# Patient Record
Sex: Female | Born: 1940 | Race: White | Hispanic: No | Smoking: Former smoker
Health system: Southern US, Community
[De-identification: ages and names within clinical notes are randomized; demographics above are authoritative.]

## PROBLEM LIST (undated history)

## (undated) DIAGNOSIS — J189 Pneumonia, unspecified organism: Secondary | ICD-10-CM

## (undated) DIAGNOSIS — N189 Chronic kidney disease, unspecified: Secondary | ICD-10-CM

## (undated) DIAGNOSIS — R112 Nausea with vomiting, unspecified: Secondary | ICD-10-CM

## (undated) DIAGNOSIS — M810 Age-related osteoporosis without current pathological fracture: Secondary | ICD-10-CM

## (undated) DIAGNOSIS — T4145XA Adverse effect of unspecified anesthetic, initial encounter: Secondary | ICD-10-CM

## (undated) DIAGNOSIS — E119 Type 2 diabetes mellitus without complications: Secondary | ICD-10-CM

## (undated) DIAGNOSIS — M75121 Complete rotator cuff tear or rupture of right shoulder, not specified as traumatic: Secondary | ICD-10-CM

## (undated) DIAGNOSIS — C801 Malignant (primary) neoplasm, unspecified: Secondary | ICD-10-CM

## (undated) DIAGNOSIS — K635 Polyp of colon: Secondary | ICD-10-CM

## (undated) DIAGNOSIS — L4050 Arthropathic psoriasis, unspecified: Secondary | ICD-10-CM

## (undated) DIAGNOSIS — N390 Urinary tract infection, site not specified: Secondary | ICD-10-CM

## (undated) DIAGNOSIS — I10 Essential (primary) hypertension: Secondary | ICD-10-CM

## (undated) DIAGNOSIS — K219 Gastro-esophageal reflux disease without esophagitis: Secondary | ICD-10-CM

## (undated) DIAGNOSIS — M19011 Primary osteoarthritis, right shoulder: Secondary | ICD-10-CM

## (undated) DIAGNOSIS — N3020 Other chronic cystitis without hematuria: Secondary | ICD-10-CM

## (undated) DIAGNOSIS — L409 Psoriasis, unspecified: Secondary | ICD-10-CM

## (undated) DIAGNOSIS — D649 Anemia, unspecified: Secondary | ICD-10-CM

## (undated) DIAGNOSIS — M159 Polyosteoarthritis, unspecified: Secondary | ICD-10-CM

## (undated) HISTORY — PX: APPENDECTOMY: SHX54

## (undated) HISTORY — PX: CATARACT EXTRACTION, BILATERAL: SHX1313

## (undated) HISTORY — PX: OTHER SURGICAL HISTORY: SHX169

---

## 1967-07-04 HISTORY — PX: ABDOMINAL HYSTERECTOMY: SHX81

## 2004-05-02 ENCOUNTER — Ambulatory Visit: Payer: Self-pay | Admitting: Family Medicine

## 2004-05-11 ENCOUNTER — Ambulatory Visit: Payer: Self-pay | Admitting: Family Medicine

## 2004-06-13 ENCOUNTER — Ambulatory Visit: Payer: Self-pay | Admitting: Obstetrics and Gynecology

## 2006-01-11 ENCOUNTER — Ambulatory Visit: Payer: Self-pay | Admitting: Family Medicine

## 2006-10-30 ENCOUNTER — Ambulatory Visit: Payer: Self-pay | Admitting: Urology

## 2006-11-08 ENCOUNTER — Ambulatory Visit: Payer: Self-pay | Admitting: Urology

## 2006-11-21 ENCOUNTER — Ambulatory Visit: Payer: Self-pay | Admitting: Urology

## 2006-11-22 ENCOUNTER — Ambulatory Visit: Payer: Self-pay | Admitting: Urology

## 2008-01-15 ENCOUNTER — Ambulatory Visit: Payer: Self-pay | Admitting: Family Medicine

## 2008-06-03 ENCOUNTER — Ambulatory Visit: Payer: Self-pay | Admitting: Unknown Physician Specialty

## 2008-07-03 HISTORY — PX: BLADDER SUSPENSION: SHX72

## 2009-09-03 ENCOUNTER — Ambulatory Visit: Payer: Self-pay | Admitting: Cardiovascular Disease

## 2009-09-03 ENCOUNTER — Ambulatory Visit: Payer: Self-pay | Admitting: Ophthalmology

## 2009-09-13 ENCOUNTER — Ambulatory Visit: Payer: Self-pay | Admitting: Ophthalmology

## 2009-10-13 ENCOUNTER — Ambulatory Visit: Payer: Self-pay | Admitting: Urology

## 2009-10-14 ENCOUNTER — Ambulatory Visit: Payer: Self-pay | Admitting: Urology

## 2009-10-28 ENCOUNTER — Ambulatory Visit: Payer: Self-pay | Admitting: Rheumatology

## 2009-11-02 ENCOUNTER — Ambulatory Visit: Payer: Self-pay | Admitting: Family Medicine

## 2009-12-01 ENCOUNTER — Ambulatory Visit: Payer: Self-pay

## 2010-01-24 ENCOUNTER — Ambulatory Visit: Payer: Self-pay | Admitting: Ophthalmology

## 2010-08-16 ENCOUNTER — Inpatient Hospital Stay: Payer: Self-pay | Admitting: Internal Medicine

## 2010-08-24 ENCOUNTER — Ambulatory Visit: Payer: Self-pay | Admitting: Internal Medicine

## 2010-09-01 ENCOUNTER — Ambulatory Visit: Payer: Self-pay | Admitting: Internal Medicine

## 2010-09-27 DIAGNOSIS — R32 Unspecified urinary incontinence: Secondary | ICD-10-CM | POA: Insufficient documentation

## 2010-09-27 DIAGNOSIS — N819 Female genital prolapse, unspecified: Secondary | ICD-10-CM | POA: Insufficient documentation

## 2010-09-27 DIAGNOSIS — N390 Urinary tract infection, site not specified: Secondary | ICD-10-CM | POA: Insufficient documentation

## 2010-10-02 ENCOUNTER — Ambulatory Visit: Payer: Self-pay | Admitting: Internal Medicine

## 2010-11-01 ENCOUNTER — Ambulatory Visit: Payer: Self-pay | Admitting: Internal Medicine

## 2010-12-05 ENCOUNTER — Ambulatory Visit: Payer: Self-pay | Admitting: Family Medicine

## 2010-12-06 ENCOUNTER — Ambulatory Visit: Payer: Self-pay | Admitting: Family Medicine

## 2010-12-12 DIAGNOSIS — N8111 Cystocele, midline: Secondary | ICD-10-CM | POA: Insufficient documentation

## 2010-12-12 DIAGNOSIS — N3941 Urge incontinence: Secondary | ICD-10-CM | POA: Insufficient documentation

## 2012-05-13 DIAGNOSIS — N3020 Other chronic cystitis without hematuria: Secondary | ICD-10-CM | POA: Insufficient documentation

## 2012-05-13 DIAGNOSIS — R350 Frequency of micturition: Secondary | ICD-10-CM | POA: Insufficient documentation

## 2012-05-27 ENCOUNTER — Ambulatory Visit: Payer: Self-pay | Admitting: Family Medicine

## 2013-05-28 ENCOUNTER — Ambulatory Visit: Payer: Self-pay | Admitting: Family Medicine

## 2013-07-03 HISTORY — PX: OVARIAN CYST REMOVAL: SHX89

## 2013-10-08 DIAGNOSIS — M1990 Unspecified osteoarthritis, unspecified site: Secondary | ICD-10-CM | POA: Insufficient documentation

## 2013-10-08 DIAGNOSIS — L4050 Arthropathic psoriasis, unspecified: Secondary | ICD-10-CM | POA: Insufficient documentation

## 2013-10-08 DIAGNOSIS — M810 Age-related osteoporosis without current pathological fracture: Secondary | ICD-10-CM | POA: Insufficient documentation

## 2013-10-08 DIAGNOSIS — L409 Psoriasis, unspecified: Secondary | ICD-10-CM | POA: Insufficient documentation

## 2014-03-23 DIAGNOSIS — E119 Type 2 diabetes mellitus without complications: Secondary | ICD-10-CM | POA: Insufficient documentation

## 2014-05-07 DIAGNOSIS — Z860101 Personal history of adenomatous and serrated colon polyps: Secondary | ICD-10-CM | POA: Insufficient documentation

## 2014-05-07 DIAGNOSIS — Z86010 Personal history of colonic polyps: Secondary | ICD-10-CM | POA: Insufficient documentation

## 2014-06-11 ENCOUNTER — Ambulatory Visit: Payer: Self-pay | Admitting: Unknown Physician Specialty

## 2014-10-26 LAB — SURGICAL PATHOLOGY

## 2015-02-01 DIAGNOSIS — A7740 Ehrlichiosis, unspecified: Secondary | ICD-10-CM | POA: Insufficient documentation

## 2015-02-19 ENCOUNTER — Inpatient Hospital Stay
Admission: EM | Admit: 2015-02-19 | Discharge: 2015-02-24 | DRG: 645 | Disposition: A | Discharge status: Home / Self Care | Payer: Medicare Other | Attending: Internal Medicine | Admitting: Internal Medicine

## 2015-02-19 DIAGNOSIS — R61 Generalized hyperhidrosis: Secondary | ICD-10-CM | POA: Diagnosis present

## 2015-02-19 DIAGNOSIS — N289 Disorder of kidney and ureter, unspecified: Secondary | ICD-10-CM | POA: Diagnosis present

## 2015-02-19 DIAGNOSIS — L4050 Arthropathic psoriasis, unspecified: Secondary | ICD-10-CM | POA: Diagnosis present

## 2015-02-19 DIAGNOSIS — M810 Age-related osteoporosis without current pathological fracture: Secondary | ICD-10-CM | POA: Diagnosis present

## 2015-02-19 DIAGNOSIS — Z87440 Personal history of urinary (tract) infections: Secondary | ICD-10-CM

## 2015-02-19 DIAGNOSIS — R1013 Epigastric pain: Secondary | ICD-10-CM | POA: Diagnosis present

## 2015-02-19 DIAGNOSIS — E222 Syndrome of inappropriate secretion of antidiuretic hormone: Secondary | ICD-10-CM | POA: Diagnosis not present

## 2015-02-19 DIAGNOSIS — E860 Dehydration: Secondary | ICD-10-CM | POA: Diagnosis present

## 2015-02-19 DIAGNOSIS — E119 Type 2 diabetes mellitus without complications: Secondary | ICD-10-CM | POA: Diagnosis present

## 2015-02-19 DIAGNOSIS — E871 Hypo-osmolality and hyponatremia: Secondary | ICD-10-CM

## 2015-02-19 DIAGNOSIS — Z9841 Cataract extraction status, right eye: Secondary | ICD-10-CM

## 2015-02-19 DIAGNOSIS — D638 Anemia in other chronic diseases classified elsewhere: Secondary | ICD-10-CM | POA: Diagnosis present

## 2015-02-19 DIAGNOSIS — Z86010 Personal history of colonic polyps: Secondary | ICD-10-CM

## 2015-02-19 DIAGNOSIS — Z803 Family history of malignant neoplasm of breast: Secondary | ICD-10-CM

## 2015-02-19 DIAGNOSIS — Z79891 Long term (current) use of opiate analgesic: Secondary | ICD-10-CM

## 2015-02-19 DIAGNOSIS — Z90710 Acquired absence of both cervix and uterus: Secondary | ICD-10-CM

## 2015-02-19 DIAGNOSIS — Z79899 Other long term (current) drug therapy: Secondary | ICD-10-CM

## 2015-02-19 DIAGNOSIS — R509 Fever, unspecified: Secondary | ICD-10-CM

## 2015-02-19 DIAGNOSIS — Z9842 Cataract extraction status, left eye: Secondary | ICD-10-CM

## 2015-02-19 HISTORY — DX: Polyp of colon: K63.5

## 2015-02-19 HISTORY — DX: Polyosteoarthritis, unspecified: M15.9

## 2015-02-19 HISTORY — DX: Arthropathic psoriasis, unspecified: L40.50

## 2015-02-19 HISTORY — DX: Type 2 diabetes mellitus without complications: E11.9

## 2015-02-19 HISTORY — DX: Psoriasis, unspecified: L40.9

## 2015-02-19 HISTORY — DX: Other chronic cystitis without hematuria: N30.20

## 2015-02-19 HISTORY — DX: Age-related osteoporosis without current pathological fracture: M81.0

## 2015-02-19 HISTORY — DX: Urinary tract infection, site not specified: N39.0

## 2015-02-19 LAB — GLUCOSE, CAPILLARY: Glucose-Capillary: 106 mg/dL — ABNORMAL HIGH (ref 65–99)

## 2015-02-19 LAB — CBC
HEMATOCRIT: 31.1 % — AB (ref 35.0–47.0)
HEMOGLOBIN: 10.6 g/dL — AB (ref 12.0–16.0)
MCH: 29.6 pg (ref 26.0–34.0)
MCHC: 34.1 g/dL (ref 32.0–36.0)
MCV: 86.8 fL (ref 80.0–100.0)
Platelets: 135 10*3/uL — ABNORMAL LOW (ref 150–440)
RBC: 3.58 MIL/uL — ABNORMAL LOW (ref 3.80–5.20)
RDW: 14.4 % (ref 11.5–14.5)
WBC: 7.5 10*3/uL (ref 3.6–11.0)

## 2015-02-19 LAB — CREATININE, SERUM
Creatinine, Ser: 1.4 mg/dL — ABNORMAL HIGH (ref 0.44–1.00)
GFR, EST AFRICAN AMERICAN: 42 mL/min — AB (ref >60)
GFR, EST NON AFRICAN AMERICAN: 36 mL/min — AB (ref >60)

## 2015-02-19 LAB — TSH: TSH: 2.562 u[IU]/mL (ref 0.350–4.500)

## 2015-02-19 MED ORDER — SODIUM CHLORIDE 0.9 % IV BOLUS (SEPSIS)
500.0000 mL | Freq: Once | INTRAVENOUS | Status: AC
Start: 2015-02-19 — End: 2015-02-19
  Administered 2015-02-19: 500 mL via INTRAVENOUS

## 2015-02-19 MED ORDER — ONDANSETRON HCL 4 MG PO TABS: 4.0000 mg | ORAL_TABLET | Freq: Four times a day (QID) | ORAL | Status: DC | PRN

## 2015-02-19 MED ORDER — HYDROCODONE-ACETAMINOPHEN 5-325 MG PO TABS: 1.0000 | ORAL_TABLET | ORAL | Status: DC | PRN

## 2015-02-19 MED ORDER — TRAMADOL HCL 50 MG PO TABS
50.0000 mg | ORAL_TABLET | Freq: Four times a day (QID) | ORAL | Status: DC | PRN
  Administered 2015-02-20 – 2015-02-23 (×3): 50 mg via ORAL
  Filled 2015-02-19 (×3): qty 1

## 2015-02-19 MED ORDER — ENOXAPARIN SODIUM 40 MG/0.4ML ~~LOC~~ SOLN
40.0000 mg | SUBCUTANEOUS | Status: DC
  Administered 2015-02-19 – 2015-02-23 (×5): 40 mg via SUBCUTANEOUS
  Filled 2015-02-19 (×5): qty 0.4

## 2015-02-19 MED ORDER — INSULIN ASPART 100 UNIT/ML ~~LOC~~ SOLN
0.0000 [IU] | Freq: Three times a day (TID) | SUBCUTANEOUS | Status: DC
  Administered 2015-02-22 – 2015-02-23 (×2): 1 [IU] via SUBCUTANEOUS
  Filled 2015-02-19 (×2): qty 1
  Filled 2015-02-19: qty 7

## 2015-02-19 MED ORDER — ACETAMINOPHEN 325 MG PO TABS
650.0000 mg | ORAL_TABLET | Freq: Four times a day (QID) | ORAL | Status: DC | PRN
  Administered 2015-02-20 – 2015-02-21 (×4): 650 mg via ORAL
  Filled 2015-02-19 (×4): qty 2

## 2015-02-19 MED ORDER — ONDANSETRON HCL 4 MG/2ML IJ SOLN: 4.0000 mg | Freq: Four times a day (QID) | INTRAMUSCULAR | Status: DC | PRN

## 2015-02-19 MED ORDER — MAGNESIUM OXIDE 400 MG PO TABS
400.0000 mg | ORAL_TABLET | Freq: Every day | ORAL | Status: DC
  Administered 2015-02-20 – 2015-02-24 (×5): 400 mg via ORAL
  Filled 2015-02-19 (×10): qty 1

## 2015-02-19 MED ORDER — CYCLOBENZAPRINE HCL 10 MG PO TABS
ORAL_TABLET | ORAL | Status: AC
  Administered 2015-02-19: 10 mg via ORAL
  Filled 2015-02-19: qty 1

## 2015-02-19 MED ORDER — CYCLOBENZAPRINE HCL 10 MG PO TABS
10.0000 mg | ORAL_TABLET | Freq: Three times a day (TID) | ORAL | Status: DC | PRN
  Administered 2015-02-19 – 2015-02-23 (×8): 10 mg via ORAL
  Filled 2015-02-19 (×7): qty 1

## 2015-02-19 MED ORDER — PHENAZOPYRIDINE HCL 200 MG PO TABS
200.0000 mg | ORAL_TABLET | Freq: Three times a day (TID) | ORAL | Status: DC | PRN
  Filled 2015-02-19: qty 1

## 2015-02-19 MED ORDER — SODIUM CHLORIDE 0.9 % IV SOLN
Freq: Once | INTRAVENOUS | Status: AC
  Administered 2015-02-19: 18:00:00 via INTRAVENOUS

## 2015-02-19 MED ORDER — OMEGA-3-ACID ETHYL ESTERS 1 G PO CAPS
ORAL_CAPSULE | Freq: Every day | ORAL | Status: DC
Start: 2015-02-19 — End: 2015-02-24
  Administered 2015-02-19 – 2015-02-24 (×6): 1 g via ORAL
  Filled 2015-02-19 (×6): qty 1

## 2015-02-19 MED ORDER — SODIUM CHLORIDE 0.9 % IV SOLN
INTRAVENOUS | Status: DC
  Administered 2015-02-19 – 2015-02-20 (×2): via INTRAVENOUS

## 2015-02-19 MED ORDER — ACETAMINOPHEN 650 MG RE SUPP: 650.0000 mg | Freq: Four times a day (QID) | RECTAL | Status: DC | PRN

## 2015-02-19 MED ORDER — OXYBUTYNIN CHLORIDE ER 5 MG PO TB24
5.0000 mg | ORAL_TABLET | Freq: Two times a day (BID) | ORAL | Status: DC
  Administered 2015-02-19 – 2015-02-24 (×10): 5 mg via ORAL
  Filled 2015-02-19 (×11): qty 1

## 2015-02-19 MED ORDER — FAMOTIDINE 20 MG PO TABS
20.0000 mg | ORAL_TABLET | Freq: Two times a day (BID) | ORAL | Status: DC
  Administered 2015-02-19 – 2015-02-24 (×10): 20 mg via ORAL
  Filled 2015-02-19 (×10): qty 1

## 2015-02-19 MED ORDER — SULFAMETHOXAZOLE-TRIMETHOPRIM 400-80 MG PO TABS
1.0000 | ORAL_TABLET | Freq: Two times a day (BID) | ORAL | Status: DC
  Administered 2015-02-19 – 2015-02-20 (×2): 1 via ORAL
  Filled 2015-02-19 (×3): qty 1

## 2015-02-19 MED ORDER — ESTRADIOL 0.1 MG/GM VA CREA
1.0000 | TOPICAL_CREAM | VAGINAL | Status: DC
  Administered 2015-02-19 – 2015-02-22 (×2): 1 via VAGINAL
  Filled 2015-02-19 (×2): qty 42.5

## 2015-02-19 MED ORDER — VITAMIN D 1000 UNITS PO TABS
1000.0000 [IU] | ORAL_TABLET | Freq: Every day | ORAL | Status: DC
  Administered 2015-02-19 – 2015-02-23 (×5): 1000 [IU] via ORAL
  Filled 2015-02-19 (×5): qty 1

## 2015-02-19 MED ORDER — CRANBERRY 500 MG PO CAPS: 500.0000 mg | ORAL_CAPSULE | Freq: Every day | ORAL | Status: DC

## 2015-02-19 MED ORDER — BIOTIN 10 MG PO CAPS: 10.0000 mg | ORAL_CAPSULE | Freq: Every day | ORAL | Status: DC

## 2015-02-19 MED ORDER — ZOLPIDEM TARTRATE 5 MG PO TABS: 5.0000 mg | ORAL_TABLET | Freq: Every evening | ORAL | Status: DC | PRN

## 2015-02-19 NOTE — ED Notes (Signed)
Pt c/o generalized bodyaches since Sunday night with nausea, then began running a fever last night 101..states she went to [facility] today and they drew labs showed Na+ 126, urea nitrogen 36.Marland Kitchen

## 2015-02-19 NOTE — ED Notes (Signed)
Pt reports knee pain 10/10.  Pt offered tylenol but refused.

## 2015-02-19 NOTE — ED Notes (Signed)
Pts daughter states she is getting weaker, was wondering if there was anything we can do for her while she waited. Pt pulled from lobby, IV initiated, fluids started per order.

## 2015-02-19 NOTE — ED Provider Notes (Signed)
[facility] Emergency Department Provider Note   ____________________________________________  Time seen: 5:45 PM I have reviewed the triage vital signs and the triage nursing note.  HISTORY  Chief Complaint Generalized Body Aches and Fever   Historian Patient, and her sister  HPI Patricia Lucas is a 74 y.o. female who presented to urgent care this morning with a complaint of mild generalized headache, nausea for several days, generalized weakness and fatigue, and was found to have laboratory evaluation consistent with hyponatremia to 126. She reports no history of prior low sodium. She states that she has been out in the heat multiple days including mowing her yard where she's been sweating a lot. No vomiting or diarrhea. No fever. No chest pain, palpitations, or shortness of breath. She did fall out of her bed a couple days ago, but did not significantly strike her head. She struck the side of her chest wall and had a chest x-ray which was reportedly negative per urgent care.She does not take blood thinners or aspirin.    Past Medical History  Diagnosis Date  . Diabetes mellitus without complication   . UTI (lower urinary tract infection)     There are no active problems to display for this patient.   Past Surgical History  Procedure Laterality Date  . Bladder suspension    . Ovarian cyst removal      Current Outpatient Rx  Name  Route  Sig  Dispense  Refill  . Biotin 10 MG CAPS   Oral   Take 10 mg by mouth daily.         . Cholecalciferol (VITAMIN D3) 1000 UNITS CAPS   Oral   Take 1,000 Units by mouth at bedtime.         . Cranberry 500 MG CAPS   Oral   Take 500 mg by mouth daily.         . cyclobenzaprine (FLEXERIL) 10 MG tablet   Oral   Take 10 mg by mouth 3 (three) times daily as needed for muscle spasms.         Marland Kitchen estradiol (ESTRACE) 0.1 MG/GM vaginal cream   Vaginal   Place 1 Applicatorful vaginally 3 (three) times a  week. Pt uses on Monday, Wednesday, and Friday.         Marland Kitchen HYDROcodone-acetaminophen (NORCO/VICODIN) 5-325 MG per tablet   Oral   Take 1 tablet by mouth every 4 (four) hours as needed for moderate pain.         . magnesium oxide (MAG-OX) 400 MG tablet   Oral   Take 400 mg by mouth daily.         . metFORMIN (GLUCOPHAGE) 500 MG tablet   Oral   Take 500 mg by mouth 2 (two) times daily.         . Omega-3 Fatty Acids (OMEGA 3 PO)   Oral   Take 1 capsule by mouth daily.         Marland Kitchen oxybutynin (DITROPAN-XL) 5 MG 24 hr tablet   Oral   Take 5 mg by mouth 2 (two) times daily.         . phenazopyridine (PYRIDIUM) 200 MG tablet   Oral   Take 200 mg by mouth 3 (three) times daily as needed for pain.         . ranitidine (ZANTAC) 150 MG tablet   Oral   Take 150 mg by mouth 2 (two) times daily.         Marland Kitchen  sulfamethoxazole-trimethoprim (BACTRIM,SEPTRA) 400-80 MG per tablet   Oral   Take 1 tablet by mouth 2 (two) times daily.         . traMADol (ULTRAM) 50 MG tablet   Oral   Take 50 mg by mouth every 6 (six) hours as needed for moderate pain.         Marland Kitchen zolpidem (AMBIEN) 5 MG tablet   Oral   Take 5 mg by mouth at bedtime as needed for sleep.           Allergies Review of patient's allergies indicates no known allergies.  No family history on file.  Social History Social History  Substance Use Topics  . Smoking status: Former Games developer  . Smokeless tobacco: Never Used  . Alcohol Use: No    Review of Systems  Constitutional: Negative for fever. Eyes: Negative for visual changes. ENT: Negative for sore throat. Cardiovascular: Negative for chest pain. Respiratory: Negative for shortness of breath. Gastrointestinal: Negative for abdominal pain, vomiting and diarrhea. Genitourinary: Negative for dysuria. Musculoskeletal: Negative for back pain. Skin: Negative for rash. Neurological: Negative for focal weakness or numbness. 10 point Review of Systems  otherwise negative ____________________________________________   PHYSICAL EXAM:  VITAL SIGNS: ED Triage Vitals  Enc Vitals Group     BP 02/19/15 1300 102/72 mmHg     Pulse Rate 02/19/15 1300 103     Resp 02/19/15 1300 18     Temp 02/19/15 1300 98.7 F (37.1 C)     Temp Source 02/19/15 1300 Oral     SpO2 02/19/15 1300 95 %     Weight 02/19/15 1300 140 lb (63.504 kg)     Height 02/19/15 1300  (1.575 m)     Head Cir --      Peak Flow --      Pain Score --      Pain Loc --      Pain Edu? --      Excl. in GC? --      Constitutional: Alert and oriented. Well appearing and in no distress. Eyes: Conjunctivae are normal. PERRL. Normal extraocular movements. ENT   Head: Normocephalic and atraumatic.   Nose: No congestion/rhinnorhea.   Mouth/Throat: Mucous membranes are moist.   Neck: No stridor. Cardiovascular/Chest: Normal rate, regular rhythm.  No murmurs, rubs, or gallops. Respiratory: Normal respiratory effort without tachypnea nor retractions. Breath sounds are clear and equal bilaterally. No wheezes/rales/rhonchi. Gastrointestinal: Soft. No distention, no guarding, no rebound. Nontender   Genitourinary/rectal:Deferred Musculoskeletal: Nontender with normal range of motion in all extremities. No joint effusions.  No lower extremity tenderness nor edema. Neurologic:  Normal speech and language. No gross or focal neurologic deficits are appreciated. Skin:  Skin is warm, dry and intact. No rash noted. Psychiatric: Mood and affect are normal. Speech and behavior are normal. Patient exhibits appropriate insight and judgment.  ____________________________________________   EKG I, Governor Rooks, MD, the attending physician have personally viewed and interpreted all ECGs.  95 bpm. Normal axis. Normal and narrow QRS. Normal ST and T-wave. ____________________________________________  LABS (pertinent positives/negatives)  I reviewed her labs from earlier today  which show mild increased BUN and creatinine, hyponatremia of 126. No prior labs available in the system for comparison. Urinalysis negative CBC minimally elevated white blood cell count.  ____________________________________________  RADIOLOGY All Xrays were viewed by me. Imaging interpreted by Radiologist.  None __________________________________________  PROCEDURES  Procedure(s) performed: None Critical Care performed: None  ____________________________________________   ED COURSE / ASSESSMENT  AND PLAN  CONSULTATIONS: Face-to-face with hospitalist for admission  Pertinent labs & imaging results that were available during my care of the patient were reviewed by me and considered in my medical decision making (see chart for details).   Patient is being evaluated for symptomatic and new hyponatremia. She was started with a normal saline bolus of 1 L. She was started on 100 cc per hour normal saline after the bolus. Intact neurologic exam, I do not feel a head CT is indicated given the complaint of headache. The headache is now better after the saline bolus.  I will admit her for observation for new and symptomatic hyponatremia.  Patient / Family / Caregiver informed of clinical course, medical decision-making process, and agree with plan.    ___________________________________________   FINAL CLINICAL IMPRESSION(S) / ED DIAGNOSES   Final diagnoses:  Hyponatremia      Governor Rooks, MD 02/19/15 Rickey Primus

## 2015-02-19 NOTE — H&P (Signed)
[facility] [place] at [facility]   PATIENT NAME: Patricia Lucas    MR#:  [id number]  DATE OF BIRTH:  1941-01-07  DATE OF ADMISSION:  02/19/2015  PRIMARY CARE PHYSICIAN: Dorothey Baseman, MD   REQUESTING/REFERRING PHYSICIAN: Governor Rooks MD  CHIEF COMPLAINT:   Chief Complaint  Patient presents with  . Generalized Body Aches  . Fever    HISTORY OF PRESENT ILLNESS: Patricia Lucas  is a 74 y.o. female with a known history of diabetes type 2, psoriasis, dyspepsia, recurrent UTIs who states that that she has not been feeling well since last Sunday. She developed some fevers and chills. Due to this symptom she went to the urgent care in the urgent care patient was noted to have a sodium that was 125 so referred to the emergency room for referral. Patient states that she is otherwise not having any cough no chest pain or shortness of breath no nausea vomiting or diarrhea  PAST MEDICAL HISTORY:   Past Medical History  Diagnosis Date  . Diabetes mellitus without complication   . UTI (lower urinary tract infection)   . Psoriasis   . Psoriatic arthritis   . Generalized OA   . Osteoporosis   . Chronic cystitis   . Colon polyp     PAST SURGICAL HISTORY:  Past Surgical History  Procedure Laterality Date  . Bladder suspension    . Ovarian cyst removal    . Abdominal hysterectomy    . Cataract extraction, bilateral    . Fibula fract      SOCIAL HISTORY:  Social History  Substance Use Topics  . Smoking status: Never Smoker   . Smokeless tobacco: Never Used  . Alcohol Use: No    FAMILY HISTORY:  Family History  Problem Relation Age of Onset  . Breast cancer Mother     DRUG ALLERGIES: No Known Allergies  REVIEW OF SYSTEMS:   CONSTITUTIONAL: Positive fever, positive fatigue or weakness.  EYES: No blurred or double vision.  EARS, NOSE, AND THROAT: No tinnitus or ear pain.  RESPIRATORY: No cough, shortness of breath, wheezing or hemoptysis.   CARDIOVASCULAR: No chest pain, orthopnea, edema.  GASTROINTESTINAL: No nausea, vomiting, diarrhea or abdominal pain.  GENITOURINARY: No dysuria, hematuria.  ENDOCRINE: No polyuria, nocturia,  HEMATOLOGY: No anemia, easy bruising or bleeding SKIN: No rash or lesion. MUSCULOSKELETAL: No joint pain or arthritis.   NEUROLOGIC: No tingling, numbness, weakness.  PSYCHIATRY: No anxiety or depression.   MEDICATIONS AT HOME:  Prior to Admission medications   Medication Sig Start Date End Date Taking? Authorizing Provider  Biotin 10 MG CAPS Take 10 mg by mouth daily.   Yes Historical Provider, MD  Cholecalciferol (VITAMIN D3) 1000 UNITS CAPS Take 1,000 Units by mouth at bedtime.   Yes Historical Provider, MD  Cranberry 500 MG CAPS Take 500 mg by mouth daily.   Yes Historical Provider, MD  cyclobenzaprine (FLEXERIL) 10 MG tablet Take 10 mg by mouth 3 (three) times daily as needed for muscle spasms.   Yes Historical Provider, MD  estradiol (ESTRACE) 0.1 MG/GM vaginal cream Place 1 Applicatorful vaginally 3 (three) times a week. Pt uses on Monday, Wednesday, and Friday.   Yes Historical Provider, MD  HYDROcodone-acetaminophen (NORCO/VICODIN) 5-325 MG per tablet Take 1 tablet by mouth every 4 (four) hours as needed for moderate pain.   Yes Historical Provider, MD  magnesium oxide (MAG-OX) 400 MG tablet Take 400 mg by mouth daily.   Yes Historical Provider, MD  metFORMIN (GLUCOPHAGE) 500 MG tablet Take 500 mg by mouth 2 (two) times daily.   Yes Historical Provider, MD  Omega-3 Fatty Acids (OMEGA 3 PO) Take 1 capsule by mouth daily.   Yes Historical Provider, MD  oxybutynin (DITROPAN-XL) 5 MG 24 hr tablet Take 5 mg by mouth 2 (two) times daily.   Yes Historical Provider, MD  phenazopyridine (PYRIDIUM) 200 MG tablet Take 200 mg by mouth 3 (three) times daily as needed for pain.   Yes Historical Provider, MD  ranitidine (ZANTAC) 150 MG tablet Take 150 mg by mouth 2 (two) times daily.   Yes Historical  Provider, MD  sulfamethoxazole-trimethoprim (BACTRIM,SEPTRA) 400-80 MG per tablet Take 1 tablet by mouth 2 (two) times daily.   Yes Historical Provider, MD  traMADol (ULTRAM) 50 MG tablet Take 50 mg by mouth every 6 (six) hours as needed for moderate pain.   Yes Historical Provider, MD  zolpidem (AMBIEN) 5 MG tablet Take 5 mg by mouth at bedtime as needed for sleep.   Yes Historical Provider, MD      PHYSICAL EXAMINATION:   VITAL SIGNS: Blood pressure 93/68, pulse 88, temperature 98.7 F (37.1 C), temperature source Oral, resp. rate 23, height 5\' 2"  (1.575 m), weight 63.504 kg (140 lb), SpO2 96 %.  GENERAL:  74 y.o.-year-old patient lying in the bed with no acute distress.  EYES: Pupils equal, round, reactive to light and accommodation. No scleral icterus. Extraocular muscles intact.  HEENT: Head atraumatic, normocephalic. Oropharynx and nasopharynx clear.  NECK:  Supple, no jugular venous distention. No thyroid enlargement, no tenderness.  LUNGS: Normal breath sounds bilaterally, no wheezing, rales,rhonchi or crepitation. No use of accessory muscles of respiration.  CARDIOVASCULAR: S1, S2 normal. No murmurs, rubs, or gallops.  ABDOMEN: Soft, nontender, nondistended. Bowel sounds present. No organomegaly or mass.  EXTREMITIES: No pedal edema, cyanosis, or clubbing.  NEUROLOGIC: Cranial nerves II through XII are intact. Muscle strength 5/5 in all extremities. Sensation intact. Gait not checked.  PSYCHIATRIC: The patient is alert and oriented x 3.  SKIN: No obvious rash, lesion, or ulcer.   LABORATORY PANEL:   CBC No results for input(s): WBC, HGB, HCT, PLT, MCV, MCH, MCHC, RDW, LYMPHSABS, MONOABS, EOSABS, BASOSABS, BANDABS in the last 168 hours.  Invalid input(s): NEUTRABS, BANDSABD ------------------------------------------------------------------------------------------------------------------  Chemistries  No results for input(s): NA, K, CL, CO2, GLUCOSE, BUN, CREATININE, CALCIUM,  MG, AST, ALT, ALKPHOS, BILITOT in the last 168 hours.  Invalid input(s): GFRCGP ------------------------------------------------------------------------------------------------------------------ CrCl cannot be calculated (Patient has no serum creatinine result on file.). ------------------------------------------------------------------------------------------------------------------ No results for input(s): TSH, T4TOTAL, T3FREE, THYROIDAB in the last 72 hours.  Invalid input(s): FREET3   Coagulation profile No results for input(s): INR, PROTIME in the last 168 hours. ------------------------------------------------------------------------------------------------------------------- No results for input(s): DDIMER in the last 72 hours. -------------------------------------------------------------------------------------------------------------------  Cardiac Enzymes No results for input(s): CKMB, TROPONINI, MYOGLOBIN in the last 168 hours.  Invalid input(s): CK ------------------------------------------------------------------------------------------------------------------ Invalid input(s): POCBNP  ---------------------------------------------------------------------------------------------------------------  Urinalysis No results found for: COLORURINE, APPEARANCEUR, LABSPEC, PHURINE, GLUCOSEU, HGBUR, BILIRUBINUR, KETONESUR, PROTEINUR, UROBILINOGEN, NITRITE, LEUKOCYTESUR   RADIOLOGY: No results found.  EKG: Orders placed or performed in visit on 08/16/10  . EKG 12-Lead    IMPRESSION AND PLAN: Patient is a 74 year old white female with history of diabetes, recurrent UTIs presents with fever fatigue this week and  1. Symptomatic hyponatremia: We'll treat with normal saline and follow her sodium likely due to dehydration  2. Fever: Urinalysis checked in the urgent care was negative. I will get  urine cultures. Obtain blood cultures currently afebrile WBC was 11.4  3.  Dyspepsia continue H2 blockers  4. Diabetes type 2 hold metformin in light of creatinine being slightly elevated placed on sliding scale insulin  5. Miscellaneous Lovenox for DVT prophylaxis    All the records are reviewed and case discussed with ED provider. Management plans discussed with the patient, family and they are in agreement.  CODE STATUS: Advance Directive Documentation        Most Recent Value   Type of Advance Directive  Living will   Pre-existing out of facility DNR order (yellow form or pink MOST form)     "MOST" Form in Place?         TOTAL TIME TAKING CARE OF THIS PATIENT:55 minutes.    Auburn Bilberry M.D on 02/19/2015 at 6:59 PM  Between 7am to 6pm - Pager - [phone]  After 6pm go to [email or web address] - password [email or web address]  [facility]  Office  [phone]  CC: Primary care physician; Dorothey Baseman, MD

## 2015-02-20 ENCOUNTER — Observation Stay: Payer: Medicare Other

## 2015-02-20 LAB — GLUCOSE, CAPILLARY
GLUCOSE-CAPILLARY: 155 mg/dL — AB (ref 65–99)
GLUCOSE-CAPILLARY: 109 mg/dL — AB (ref 65–99)
GLUCOSE-CAPILLARY: 96 mg/dL (ref 65–99)
Glucose-Capillary: 107 mg/dL — ABNORMAL HIGH (ref 65–99)

## 2015-02-20 LAB — BASIC METABOLIC PANEL
Anion gap: 6 (ref 5–15)
Anion gap: 6 (ref 5–15)
BUN: 22 mg/dL — AB (ref 6–20)
BUN: 17 mg/dL (ref 6–20)
CALCIUM: 7.6 mg/dL — AB (ref 8.9–10.3)
CALCIUM: 7.3 mg/dL — AB (ref 8.9–10.3)
CO2: 23 mmol/L (ref 22–32)
CO2: 21 mmol/L — ABNORMAL LOW (ref 22–32)
Chloride: 101 mmol/L (ref 101–111)
Chloride: 101 mmol/L (ref 101–111)
Creatinine, Ser: 1.15 mg/dL — ABNORMAL HIGH (ref 0.44–1.00)
Creatinine, Ser: 1.17 mg/dL — ABNORMAL HIGH (ref 0.44–1.00)
GFR calc Af Amer: 53 mL/min — ABNORMAL LOW (ref >60)
GFR calc Af Amer: 52 mL/min — ABNORMAL LOW (ref >60)
GFR, EST NON AFRICAN AMERICAN: 46 mL/min — AB (ref >60)
GFR, EST NON AFRICAN AMERICAN: 45 mL/min — AB (ref >60)
GLUCOSE: 114 mg/dL — AB (ref 65–99)
Glucose, Bld: 95 mg/dL (ref 65–99)
POTASSIUM: 3.8 mmol/L (ref 3.5–5.1)
Potassium: 4.4 mmol/L (ref 3.5–5.1)
SODIUM: 128 mmol/L — AB (ref 135–145)
Sodium: 130 mmol/L — ABNORMAL LOW (ref 135–145)

## 2015-02-20 LAB — URINALYSIS COMPLETE WITH MICROSCOPIC (ARMC ONLY)
Bilirubin Urine: NEGATIVE
GLUCOSE, UA: NEGATIVE mg/dL
HGB URINE DIPSTICK: NEGATIVE
Ketones, ur: NEGATIVE mg/dL
LEUKOCYTES UA: NEGATIVE
Nitrite: NEGATIVE
PROTEIN: 30 mg/dL — AB
Specific Gravity, Urine: 1.015 (ref 1.005–1.030)
pH: 6 (ref 5.0–8.0)

## 2015-02-20 LAB — CBC
HCT: 30.4 % — ABNORMAL LOW (ref 35.0–47.0)
Hemoglobin: 10.2 g/dL — ABNORMAL LOW (ref 12.0–16.0)
MCH: 29.1 pg (ref 26.0–34.0)
MCHC: 33.5 g/dL (ref 32.0–36.0)
MCV: 86.9 fL (ref 80.0–100.0)
PLATELETS: 133 10*3/uL — AB (ref 150–440)
RBC: 3.5 MIL/uL — ABNORMAL LOW (ref 3.80–5.20)
RDW: 14.8 % — AB (ref 11.5–14.5)
WBC: 7.6 10*3/uL (ref 3.6–11.0)

## 2015-02-20 MED ORDER — DEXTROSE 5 % IV SOLN
1.0000 g | INTRAVENOUS | Status: DC
  Administered 2015-02-20: 15:00:00 1 g via INTRAVENOUS
  Filled 2015-02-20 (×2): qty 10

## 2015-02-20 NOTE — Progress Notes (Signed)
Verified with Dr. Winona Legato that she still wants blood cultures drawn despite the fact pt has been taking Bactrim at home.

## 2015-02-20 NOTE — Progress Notes (Signed)
[facility] - [place] at [facility]   PATIENT NAME: Patricia Lucas    MR#:  [id number]  DATE OF BIRTH:  1941/04/22  SUBJECTIVE:  CHIEF COMPLAINT:   Chief Complaint  Patient presents with  . Generalized Body Aches  . Fever   Patient is 74 year old female with history of diabetes, dyspepsia, recurrent UTIs, presented to the hospital with feeling feverish and chilly , having body aches and was noted to have sodium level of 125. Now she is receiving normal saline infusion. Feels good. Denies any fevers or chills or body aches. She wants to go home. I temperature, however, was noted to be 100.7 today in the morning. Denies any diarrhea, nausea, vomiting, cough or sputum production.  Review of Systems  Constitutional: Negative for fever, chills and weight loss.  HENT: Negative for congestion.   Eyes: Negative for blurred vision and double vision.  Respiratory: Negative for cough, sputum production, shortness of breath and wheezing.   Cardiovascular: Negative for chest pain, palpitations, orthopnea, leg swelling and PND.  Gastrointestinal: Negative for nausea, vomiting, abdominal pain, diarrhea, constipation and blood in stool.  Genitourinary: Negative for dysuria, urgency, frequency and hematuria.  Musculoskeletal: Negative for falls.  Neurological: Negative for dizziness, tremors, focal weakness and headaches.  Endo/Heme/Allergies: Does not bruise/bleed easily.  Psychiatric/Behavioral: Negative for depression. The patient does not have insomnia.     VITAL SIGNS: Blood pressure 109/60, pulse 105, temperature 100.7 F (38.2 C), temperature source Oral, resp. rate 16, height  (1.575 m), weight 70.625 kg (155 lb 11.2 oz), SpO2 95 %.  PHYSICAL EXAMINATION:   GENERAL:  74 y.o.-year-old patient lying in the bed with no acute distress.  EYES: Pupils equal, round, reactive to light and accommodation. No scleral icterus. Extraocular muscles intact.  HEENT: Head  atraumatic, normocephalic. Oropharynx and nasopharynx clear.  NECK:  Supple, no jugular venous distention. No thyroid enlargement, no tenderness.  LUNGS: Normal breath sounds bilaterally, no wheezing, rales,rhonchi or crepitation. No use of accessory muscles of respiration. Few Crackles scattered in all lung fields bilaterally CARDIOVASCULAR: S1, S2 normal. No murmurs, rubs, or gallops.  ABDOMEN: Soft, nontender, nondistended. Bowel sounds present. No organomegaly or mass.  EXTREMITIES: No pedal edema, cyanosis, or clubbing.  NEUROLOGIC: Cranial nerves II through XII are intact. Muscle strength 5/5 in all extremities. Sensation intact. Gait not checked.  PSYCHIATRIC: The patient is alert and oriented x 3.  SKIN: No obvious rash, lesion, or ulcer.   ORDERS/RESULTS REVIEWED:   CBC  Recent Labs Lab 02/19/15 2019 02/20/15 0444  WBC 7.5 7.6  HGB 10.6* 10.2*  HCT 31.1* 30.4*  PLT 135* 133*  MCV 86.8 86.9  MCH 29.6 29.1  MCHC 34.1 33.5  RDW 14.4 14.8*   ------------------------------------------------------------------------------------------------------------------  Chemistries   Recent Labs Lab 02/19/15 2019 02/20/15 0444  NA  --  130*  K  --  4.4  CL  --  101  CO2  --  23  GLUCOSE  --  114*  BUN  --  22*  CREATININE 1.40* 1.15*  CALCIUM  --  7.6*   ------------------------------------------------------------------------------------------------------------------ estimated creatinine clearance is 39.5 mL/min (by C-G formula based on Cr of 1.15). ------------------------------------------------------------------------------------------------------------------  Recent Labs  02/19/15 2019  TSH 2.562    Cardiac Enzymes No results for input(s): CKMB, TROPONINI, MYOGLOBIN in the last 168 hours.  Invalid input(s): CK ------------------------------------------------------------------------------------------------------------------ Invalid input(s):  POCBNP ---------------------------------------------------------------------------------------------------------------  RADIOLOGY: No results found.  EKG:  Orders placed or performed during the hospital encounter of 02/19/15  .  EKG 12-Lead  . EKG 12-Lead    ASSESSMENT AND PLAN:  Active Problems:   Hyponatremia 1.  fever of unclear etiology at this time. Get chest x-ray, could be viral, as white blood cell count is normal 2. Hyponatremia seemed to be resolving with IV fluids, follow later in the day 3. Renal insufficiency, also better with IV fluids, follow in the evening 4. Diabetes mellitus. Continue outpatient medications as well as sliding scale insulin. Get hemoglobin A1c 5. Anemia. Get guaiac   Management plans discussed with the patient, family and they are in agreement.   DRUG ALLERGIES: No Known Allergies  CODE STATUS:     Code Status Orders        Start     Ordered   02/19/15 1950  Full code   Continuous     02/19/15 1949    Advance Directive Documentation        Most Recent Value   Type of Advance Directive  Healthcare Power of Attorney, Living will   Pre-existing out of facility DNR order (yellow form or pink MOST form)     "MOST" Form in Place?        TOTAL TIME TAKING CARE OF THIS PATIENT: 40 minutes.    Katharina Caper M.D on 02/20/2015 at 8:16 AM  Between 7am to 6pm - Pager - [phone]  After 6pm go to [email or web address] - password [email or web address] [facility]  [facility]  Office  [phone]  CC: Primary care physician; Dorothey Baseman, MD

## 2015-02-20 NOTE — Plan of Care (Signed)
Problem: Discharge Progression Outcomes Goal: Other Discharge Outcomes/Goals Outcome: Progressing Pt sent to ER from [facility] urgent care for sodium 126 BUN 36, pt has not been feeling well for last week not eating and drinking well, VSS IVF infusing as ordered labs to be rechecked this morning, pt hoping to be discharged.

## 2015-02-20 NOTE — Progress Notes (Signed)
Dr. Winona Legato notified pt refusing insulin & requesting Metformin as she takes at home. Kidney function not WDL, BMP recheck at 1500. Will reassess then. Will explain & educate pt again that insulin is the only way to manage her blood sugar at this time.

## 2015-02-21 ENCOUNTER — Inpatient Hospital Stay
Admit: 2015-02-21 | Discharge: 2015-02-21 | Disposition: A | Discharge status: Home / Self Care | Payer: Medicare Other | Attending: Internal Medicine | Admitting: Internal Medicine

## 2015-02-21 DIAGNOSIS — Z9841 Cataract extraction status, right eye: Secondary | ICD-10-CM | POA: Diagnosis not present

## 2015-02-21 DIAGNOSIS — Z79899 Other long term (current) drug therapy: Secondary | ICD-10-CM | POA: Diagnosis not present

## 2015-02-21 DIAGNOSIS — D638 Anemia in other chronic diseases classified elsewhere: Secondary | ICD-10-CM | POA: Diagnosis present

## 2015-02-21 DIAGNOSIS — L4050 Arthropathic psoriasis, unspecified: Secondary | ICD-10-CM | POA: Diagnosis present

## 2015-02-21 DIAGNOSIS — E860 Dehydration: Secondary | ICD-10-CM | POA: Diagnosis present

## 2015-02-21 DIAGNOSIS — Z90710 Acquired absence of both cervix and uterus: Secondary | ICD-10-CM | POA: Diagnosis not present

## 2015-02-21 DIAGNOSIS — Z9842 Cataract extraction status, left eye: Secondary | ICD-10-CM | POA: Diagnosis not present

## 2015-02-21 DIAGNOSIS — E222 Syndrome of inappropriate secretion of antidiuretic hormone: Secondary | ICD-10-CM | POA: Diagnosis present

## 2015-02-21 DIAGNOSIS — M810 Age-related osteoporosis without current pathological fracture: Secondary | ICD-10-CM | POA: Diagnosis present

## 2015-02-21 DIAGNOSIS — E871 Hypo-osmolality and hyponatremia: Secondary | ICD-10-CM | POA: Diagnosis present

## 2015-02-21 DIAGNOSIS — Z87440 Personal history of urinary (tract) infections: Secondary | ICD-10-CM | POA: Diagnosis not present

## 2015-02-21 DIAGNOSIS — Z803 Family history of malignant neoplasm of breast: Secondary | ICD-10-CM | POA: Diagnosis not present

## 2015-02-21 DIAGNOSIS — R61 Generalized hyperhidrosis: Secondary | ICD-10-CM | POA: Diagnosis present

## 2015-02-21 DIAGNOSIS — R509 Fever, unspecified: Secondary | ICD-10-CM | POA: Diagnosis present

## 2015-02-21 DIAGNOSIS — N289 Disorder of kidney and ureter, unspecified: Secondary | ICD-10-CM | POA: Diagnosis present

## 2015-02-21 DIAGNOSIS — Z86010 Personal history of colonic polyps: Secondary | ICD-10-CM | POA: Diagnosis not present

## 2015-02-21 DIAGNOSIS — E119 Type 2 diabetes mellitus without complications: Secondary | ICD-10-CM | POA: Diagnosis present

## 2015-02-21 DIAGNOSIS — R1013 Epigastric pain: Secondary | ICD-10-CM | POA: Diagnosis present

## 2015-02-21 DIAGNOSIS — Z79891 Long term (current) use of opiate analgesic: Secondary | ICD-10-CM | POA: Diagnosis not present

## 2015-02-21 LAB — GLUCOSE, CAPILLARY
GLUCOSE-CAPILLARY: 115 mg/dL — AB (ref 65–99)
GLUCOSE-CAPILLARY: 105 mg/dL — AB (ref 65–99)
GLUCOSE-CAPILLARY: 135 mg/dL — AB (ref 65–99)
Glucose-Capillary: 132 mg/dL — ABNORMAL HIGH (ref 65–99)

## 2015-02-21 LAB — BASIC METABOLIC PANEL
Anion gap: 6 (ref 5–15)
BUN: 11 mg/dL (ref 6–20)
CHLORIDE: 105 mmol/L (ref 101–111)
CO2: 19 mmol/L — ABNORMAL LOW (ref 22–32)
Calcium: 7.2 mg/dL — ABNORMAL LOW (ref 8.9–10.3)
Creatinine, Ser: 1.04 mg/dL — ABNORMAL HIGH (ref 0.44–1.00)
GFR calc Af Amer: 60 mL/min — ABNORMAL LOW (ref >60)
GFR calc non Af Amer: 52 mL/min — ABNORMAL LOW (ref >60)
GLUCOSE: 119 mg/dL — AB (ref 65–99)
POTASSIUM: 3.8 mmol/L (ref 3.5–5.1)
SODIUM: 130 mmol/L — AB (ref 135–145)

## 2015-02-21 LAB — OSMOLALITY, URINE: Osmolality, Ur: 572 mOsm/kg (ref 300–900)

## 2015-02-21 LAB — RAPID INFLUENZA A&B ANTIGENS (ARMC ONLY)
INFLUENZA A (ARMC): NEGATIVE
INFLUENZA B (ARMC): NEGATIVE

## 2015-02-21 LAB — HEMOGLOBIN A1C: Hgb A1c MFr Bld: 6.3 % — ABNORMAL HIGH (ref 4.0–6.0)

## 2015-02-21 MED ORDER — DOXYCYCLINE HYCLATE 100 MG PO TABS
100.0000 mg | ORAL_TABLET | Freq: Two times a day (BID) | ORAL | Status: DC
  Administered 2015-02-21 – 2015-02-24 (×7): 100 mg via ORAL
  Filled 2015-02-21 (×7): qty 1

## 2015-02-21 NOTE — Progress Notes (Signed)
[facility] - [place] at [facility]   PATIENT NAME: Patricia Lucas    MR#:  [id number]  DATE OF BIRTH:  1940-12-22  SUBJECTIVE:  CHIEF COMPLAINT:   Chief Complaint  Patient presents with  . Generalized Body Aches  . Fever   Patient is 74 year old female with history of diabetes, dyspepsia, recurrent UTIs, presented to the hospital with feeling feverish and chilly , having body aches and was noted to have sodium level of 125. Receiving normal saline infusion, patient's sodium level improved initially but then declined. Was noted to have a high fevers and remains febrile to 103 over the past 24 hours, has been managed on the Rocephin over the past 24 hours,  despite Rocephin remains febrile. Urine cultures showed insignificant growth. Blood cultures taken yesterday, 02/20/2015 showed no growth.  She complains of significant sweats, diaphoresis during nighttime   . Review of Systems  Constitutional: Negative for fever, chills and weight loss.  HENT: Negative for congestion.   Eyes: Negative for blurred vision and double vision.  Respiratory: Negative for cough, sputum production, shortness of breath and wheezing.   Cardiovascular: Negative for chest pain, palpitations, orthopnea, leg swelling and PND.  Gastrointestinal: Negative for nausea, vomiting, abdominal pain, diarrhea, constipation and blood in stool.  Genitourinary: Negative for dysuria, urgency, frequency and hematuria.  Musculoskeletal: Negative for falls.  Neurological: Negative for dizziness, tremors, focal weakness and headaches.  Endo/Heme/Allergies: Does not bruise/bleed easily.  Psychiatric/Behavioral: Negative for depression. The patient does not have insomnia.     VITAL SIGNS: Blood pressure 112/56, pulse 102, temperature 101.4 F (38.6 C), temperature source Oral, resp. rate 18, height  (1.575 m), weight 70.625 kg (155 lb 11.2 oz), SpO2 96 %.  PHYSICAL EXAMINATION:   GENERAL:  74  y.o.-year-old patient lying in the bed with no acute distress. Profoundly diaphoretic EYES: Pupils equal, round, reactive to light and accommodation. No scleral icterus. Extraocular muscles intact.  HEENT: Head atraumatic, normocephalic. Oropharynx and nasopharynx clear.  NECK:  Supple, no jugular venous distention. No thyroid enlargement, no tenderness.  LUNGS: Normal breath sounds bilaterally, no wheezing, rales,rhonchi or crepitation. No use of accessory muscles of respiration. Few Crackles scattered in all lung fields bilaterally CARDIOVASCULAR: S1, S2 normal. No murmurs, rubs, or gallops.  ABDOMEN: Soft, nontender, nondistended. Bowel sounds present. No organomegaly or mass.  EXTREMITIES: No pedal edema, cyanosis, or clubbing.  NEUROLOGIC: Cranial nerves II through XII are intact. Muscle strength 5/5 in all extremities. Sensation intact. Gait not checked.  PSYCHIATRIC: The patient is alert and oriented x 3.  SKIN: No obvious rash, lesion, or ulcer.   ORDERS/RESULTS REVIEWED:   CBC  Recent Labs Lab 02/19/15 2019 02/20/15 0444  WBC 7.5 7.6  HGB 10.6* 10.2*  HCT 31.1* 30.4*  PLT 135* 133*  MCV 86.8 86.9  MCH 29.6 29.1  MCHC 34.1 33.5  RDW 14.4 14.8*   ------------------------------------------------------------------------------------------------------------------  Chemistries   Recent Labs Lab 02/19/15 2019 02/20/15 0444 02/20/15 1447 02/21/15 0729  NA  --  130* 128* 130*  K  --  4.4 3.8 3.8  CL  --  101 101 105  CO2  --  23 21* 19*  GLUCOSE  --  114* 95 119*  BUN  --  22* 17 11  CREATININE 1.40* 1.15* 1.17* 1.04*  CALCIUM  --  7.6* 7.3* 7.2*   ------------------------------------------------------------------------------------------------------------------ estimated creatinine clearance is 43.7 mL/min (by C-G formula based on Cr of 1.04). ------------------------------------------------------------------------------------------------------------------  Recent  Labs  02/19/15 2019  TSH 2.562    Cardiac Enzymes No results for input(s): CKMB, TROPONINI, MYOGLOBIN in the last 168 hours.  Invalid input(s): CK ------------------------------------------------------------------------------------------------------------------ Invalid input(s): POCBNP ---------------------------------------------------------------------------------------------------------------  RADIOLOGY: Dg Chest 2 View  02/20/2015   CLINICAL DATA:  Fever, nausea, body aches for 6 days.  EXAM: CHEST  2 VIEW  COMPARISON:  08/16/2010  FINDINGS: The heart size and mediastinal contours are within normal limits. Both lungs are clear. No evidence pleural effusion. Mild thoracic spine degenerative changes again noted.  IMPRESSION: No active cardiopulmonary disease.   Electronically Signed   By: Myles Rosenthal M.D.   On: 02/20/2015 14:55    EKG:  Orders placed or performed during the hospital encounter of 02/19/15  . EKG 12-Lead  . EKG 12-Lead    ASSESSMENT AND PLAN:  Active Problems:   Hyponatremia   Fever of unknown origin 1.  fever of unclear etiology at this time, suspected and infectious. Chest x-ray showed no pneumonia, blood cultures are negative so far. Urine culture is unremarkable. Getting infectious disease specialist involved for further recommendations. Getting serology for Ehrlichia,  Lyme's disease as well as [facility] spotted fever. Start patient on doxycycline orally . Following results 2. Hyponatremia, likely due to profound diaphoresis, dehydration. 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 Acute Renal insufficiency, possibly related to dehydration as it was getting better with IV fluids, follow in the morning tomorrow 4. Diabetes mellitus. Continue  outpatient medications as well as sliding scale insulin. hemoglobin A1c to be checked today 5. Anemia. Get guaiac   Management plans discussed with the patient, and she is in agreement.   DRUG ALLERGIES: No Known Allergies  CODE STATUS:     Code Status Orders        Start     Ordered   02/19/15 1950  Full code   Continuous     02/19/15 1949    Advance Directive Documentation        Most Recent Value   Type of Advance Directive  Healthcare Power of Attorney, Living will   Pre-existing out of facility DNR order (yellow form or pink MOST form)     "MOST" Form in Place?        TOTAL TIME TAKING CARE OF THIS PATIENT: 40 minutes.    Katharina Caper M.D on 02/21/2015 at 12:04 PM  Between 7am to 6pm - Pager - [phone]  After 6pm go to [email or web address] - password [email or web address] [facility]  [facility]  Office  [phone]  CC: Primary care physician; Dorothey Baseman, MD

## 2015-02-21 NOTE — Plan of Care (Signed)
Problem: Discharge Progression Outcomes Goal: Other Discharge Outcomes/Goals Outcome: Progressing Patient with temp of 102.9 relieved with tylenol, IVF infusing per md order, intermittent confusion. Awaiting am labs for sodium.

## 2015-02-21 NOTE — Clinical Documentation Improvement (Signed)
  Internal Medicine  Can the diagnosis of renal insufficiency be further specified?   Acute Renal Failure/Acute Kidney Injury  Acute Tubular Necrosis  Acute on Chronic Renal Failure  Chronic Renal Failure  Other  Clinically Undetermined  Document any associated diagnoses/conditions.  Supporting Information:(As per notes) "Renal insufficiency, also better with IV fluids, follow in the evening."  Labs: Component     Latest Ref Rng 02/19/2015 02/20/2015 02/20/2015 02/21/2015          4:44 AM  2:47 PM   Sodium     135 - 145 mmol/L  130 (L) 128 (L) 130 (L)  Potassium     3.5 - 5.1 mmol/L  4.4 3.8 3.8  Chloride     101 - 111 mmol/L  101 101 105  CO2     22 - 32 mmol/L  23 21 (L) 19 (L)  Glucose     65 - 99 mg/dL  409 (H) 95 811 (H)  BUN     6 - 20 mg/dL  22 (H) 17 11  Creatinine     0.44 - 1.00 mg/dL 9.14 (H) 7.82 (H) 9.56 (H) 1.04 (H)   Please exercise your independent, professional judgment when responding. A specific answer is not anticipated or expected.  Thank You, Nevin Bloodgood, RN, BSN, CCDS,Clinical Documentation Specialist:  [phone]  [phone]=[facility]- Health Information Management

## 2015-02-22 LAB — HEPATIC FUNCTION PANEL
ALK PHOS: 43 U/L (ref 38–126)
ALT: 27 U/L (ref 14–54)
AST: 43 U/L — ABNORMAL HIGH (ref 15–41)
Albumin: 2.7 g/dL — ABNORMAL LOW (ref 3.5–5.0)
BILIRUBIN INDIRECT: 0.1 mg/dL — AB (ref 0.3–0.9)
BILIRUBIN TOTAL: 0.2 mg/dL — AB (ref 0.3–1.2)
Bilirubin, Direct: 0.1 mg/dL (ref 0.1–0.5)
TOTAL PROTEIN: 7.2 g/dL (ref 6.5–8.1)

## 2015-02-22 LAB — CBC WITH DIFFERENTIAL/PLATELET
Basophils Absolute: 0.1 10*3/uL (ref 0–0.1)
Basophils Relative: 2 %
Eosinophils Absolute: 0.2 10*3/uL (ref 0–0.7)
Eosinophils Relative: 3 %
HEMATOCRIT: 27.7 % — AB (ref 35.0–47.0)
HEMOGLOBIN: 9.3 g/dL — AB (ref 12.0–16.0)
LYMPHS ABS: 3 10*3/uL (ref 1.0–3.6)
Lymphocytes Relative: 39 %
MCH: 29.1 pg (ref 26.0–34.0)
MCHC: 33.5 g/dL (ref 32.0–36.0)
MCV: 86.8 fL (ref 80.0–100.0)
MONOS PCT: 13 %
Monocytes Absolute: 1 10*3/uL — ABNORMAL HIGH (ref 0.2–0.9)
NEUTROS ABS: 3.4 10*3/uL (ref 1.4–6.5)
NEUTROS PCT: 43 %
Platelets: 151 10*3/uL (ref 150–440)
RBC: 3.19 MIL/uL — ABNORMAL LOW (ref 3.80–5.20)
RDW: 14.8 % — ABNORMAL HIGH (ref 11.5–14.5)
WBC: 7.7 10*3/uL (ref 3.6–11.0)

## 2015-02-22 LAB — GLUCOSE, CAPILLARY
GLUCOSE-CAPILLARY: 123 mg/dL — AB (ref 65–99)
Glucose-Capillary: 120 mg/dL — ABNORMAL HIGH (ref 65–99)
Glucose-Capillary: 118 mg/dL — ABNORMAL HIGH (ref 65–99)
Glucose-Capillary: 102 mg/dL — ABNORMAL HIGH (ref 65–99)

## 2015-02-22 LAB — URINE CULTURE

## 2015-02-22 NOTE — Plan of Care (Signed)
Problem: Discharge Progression Outcomes Goal: Other Discharge Outcomes/Goals Outcome: Progressing Medication given for muscle spasms with noted relief. VSS. No complaints of pain. Tolerating diet, no complaints of  nausea. Patient remains afebrile.

## 2015-02-22 NOTE — Progress Notes (Signed)
[facility] - [place] at [facility]   PATIENT NAME: Rashan Patient    MR#:  [id number]  DATE OF BIRTH:  [birth year]  SUBJECTIVE:  CHIEF COMPLAINT:   Chief Complaint  Patient presents with  . Generalized Body Aches  . Fever   Patient is 74 year old female with history of diabetes, dyspepsia, recurrent UTIs, presented to the hospital with feeling feverish and chilly , having body aches and was noted to have sodium level of 125. Receiving normal saline infusion, patient's sodium level improved initially but then declined. Was noted to have a high fevers and remains febrile to 103 over the past 24 hours, has been managed on the Rocephin over the past 24 hours,  despite Rocephin remains febrile. Urine cultures showed insignificant growth. Blood cultures taken yesterday, 02/20/2015 showed no growth.  She complains of significant sweats, diaphoresis during nighttime.  Still having fever spikes up to 103 last night   . Review of Systems  Constitutional: Positive for fever and chills. Negative for weight loss.  HENT: Negative for congestion.   Eyes: Negative for blurred vision and double vision.  Respiratory: Negative for cough, sputum production, shortness of breath and wheezing.   Cardiovascular: Negative for chest pain, palpitations, orthopnea, leg swelling and PND.  Gastrointestinal: Negative for nausea, vomiting, abdominal pain, diarrhea, constipation and blood in stool.  Genitourinary: Negative for dysuria, urgency, frequency and hematuria.  Musculoskeletal: Positive for myalgias and joint pain. Negative for falls.  Neurological: Negative for dizziness, tremors, focal weakness and headaches.  Endo/Heme/Allergies: Does not bruise/bleed easily.  Psychiatric/Behavioral: Negative for depression. The patient does not have insomnia.     VITAL SIGNS: Blood pressure 116/55, pulse 103, temperature 99 F (37.2 C), temperature source Oral, resp. rate 18, height 5\' 2"   (1.575 m), weight 70.625 kg (155 lb 11.2 oz), SpO2 95 %.  PHYSICAL EXAMINATION:   GENERAL:  74 y.o.-year-old patient lying in the bed with no acute distress. Profoundly diaphoretic EYES: Pupils equal, round, reactive to light and accommodation. No scleral icterus. Extraocular muscles intact.  HEENT: Head atraumatic, normocephalic. Oropharynx and nasopharynx clear.  NECK:  Supple, no jugular venous distention. No thyroid enlargement, no tenderness.  LUNGS: Normal breath sounds bilaterally, no wheezing, rales,rhonchi or crepitation. No use of accessory muscles of respiration. Few Crackles scattered in all lung fields bilaterally CARDIOVASCULAR: S1, S2 normal. No murmurs, rubs, or gallops.  ABDOMEN: Soft, nontender, nondistended. Bowel sounds present. No organomegaly or mass.  EXTREMITIES: No pedal edema, cyanosis, or clubbing.  NEUROLOGIC: Cranial nerves II through XII are intact. Muscle strength 5/5 in all extremities. Sensation intact. Gait not checked.  PSYCHIATRIC: The patient is alert and oriented x 3.  SKIN: Psoriasis rash +, lesion, or ulcer.   ORDERS/RESULTS REVIEWED:   CBC  Recent Labs Lab 02/19/15 2019 02/20/15 0444  WBC 7.5 7.6  HGB 10.6* 10.2*  HCT 31.1* 30.4*  PLT 135* 133*  MCV 86.8 86.9  MCH 29.6 29.1  MCHC 34.1 33.5  RDW 14.4 14.8*   ------------------------------------------------------------------------------------------------------------------  Chemistries   Recent Labs Lab 02/19/15 2019 02/20/15 0444 02/20/15 1447 02/21/15 0729  NA  --  130* 128* 130*  K  --  4.4 3.8 3.8  CL  --  101 101 105  CO2  --  23 21* 19*  GLUCOSE  --  114* 95 119*  BUN  --  22* 17 11  CREATININE 1.40* 1.15* 1.17* 1.04*  CALCIUM  --  7.6* 7.3* 7.2*   ------------------------------------------------------------------------------------------------------------------ estimated creatinine clearance is  43.7 mL/min (by C-G formula based on Cr of  1.04). ------------------------------------------------------------------------------------------------------------------  Recent Labs  02/19/15 2019  TSH 2.562    RADIOLOGY: Dg Chest 2 View  02/20/2015   CLINICAL DATA:  Fever, nausea, body aches for 6 days.  EXAM: CHEST  2 VIEW  COMPARISON:  08/16/2010  FINDINGS: The heart size and mediastinal contours are within normal limits. Both lungs are clear. No evidence pleural effusion. Mild thoracic spine degenerative changes again noted.  IMPRESSION: No active cardiopulmonary disease.   Electronically Signed   By: Myles Rosenthal M.D.   On: 02/20/2015 14:55    ASSESSMENT AND PLAN:  Active Problems:   Hyponatremia   Fever of unknown origin 1.  fever of unclear etiology at this time, suspected infectious. Chest x-ray showed no pneumonia, blood cultures are negative so far. Urine culture is unremarkable. Getting infectious disease specialist involved for further recommendations. Getting serology for Ehrlichia,  Lyme's disease as well as [facility] spotted fever. Started patient on doxycycline orally on weekend. Following results, still running fever up to 103  2. Hyponatremia, likely due to profound diaphoresis, dehydration. 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 Acute Renal insufficiency: Resolved  4. Diabetes mellitus. Continue outpatient medications as well as sliding scale insulin. hemoglobin A1c 6.3  5. Anemia. Get guaiac   Management plans discussed with the patient, and her daughter Melba who is in agreement.   DRUG ALLERGIES: No Known Allergies  CODE STATUS: Full code  TOTAL TIME TAKING CARE OF THIS PATIENT: 30 minutes.    [facility], Janise Gora M.D on 02/22/2015 at 8:59 AM  Between 7am to 6pm - Pager - [phone]  After 6pm go to [email or web address] - password [email or web address] [facility]  [place]  [facility]  Office  [phone]  CC: Primary care physician; Dorothey Baseman, MD

## 2015-02-22 NOTE — Consult Note (Signed)
[facility] Infectious Disease     Reason for Consult: FUO, TCP, hyponatremia    Referring Physician: Theodoro Grist Date of Admission:  02/19/2015   Active Problems:   Hyponatremia   Fever of unknown origin   HPI: Patricia Lucas is a 74 y.o. female with Psoriatic arthritis admitted with fevers and hyponatremia.  She had been in her usual state of health until one week when she returned from visiting her children in [place]. She started to feel achy, joint pain. Then developed fevers and chills.  She had mod HA at first as well but improving. She has not had any conjunctivitis, ST, swollen glands, dental pain, recent dental work, neck pain, cp or SOB.  She has not had any n/v/d/c or dysuria.  She has not had any skin boils but has had for several weeks a dark lesion on her R leg.  Does have mild dry cough off on. She had no other travel. Does mow her lawn every 5 days. She has 2 new kittens at home and lots of LE scratches. No other sick contacts. Is on remicade for PA. Takes Bactrim DS qd for UTI prevention.   Past Medical History  Diagnosis Date  . Diabetes mellitus without complication   . UTI (lower urinary tract infection)   . Psoriasis   . Psoriatic arthritis   . Generalized OA   . Osteoporosis   . Chronic cystitis   . Colon polyp    Past Surgical History  Procedure Laterality Date  . Bladder suspension    . Ovarian cyst removal    . Abdominal hysterectomy    . Cataract extraction, bilateral    . Fibula fract     Social History  Substance Use Topics  . Smoking status: Never Smoker   . Smokeless tobacco: Never Used  . Alcohol Use: No   Family History  Problem Relation Age of Onset  . Breast cancer Mother     Allergies: No Known Allergies  Current antibiotics: Antibiotics Given (last 72 hours)    Date/Time Action Medication Dose Rate   02/19/15 2145 Given   sulfamethoxazole-trimethoprim (BACTRIM,SEPTRA) 400-80 MG per tablet 1 tablet 1 tablet    02/20/15  0938 Given   sulfamethoxazole-trimethoprim (BACTRIM,SEPTRA) 400-80 MG per tablet 1 tablet 1 tablet    02/20/15 1528 Given   cefTRIAXone (ROCEPHIN) 1 g in dextrose 5 % 50 mL IVPB 1 g 100 mL/hr   02/21/15 1015 Given   doxycycline (VIBRA-TABS) tablet 100 mg 100 mg    02/21/15 2015 Given   doxycycline (VIBRA-TABS) tablet 100 mg 100 mg    02/22/15 1043 Given   doxycycline (VIBRA-TABS) tablet 100 mg 100 mg       MEDICATIONS: . cholecalciferol  1,000 Units Oral QHS  . doxycycline  100 mg Oral Q12H  . enoxaparin (LOVENOX) injection  40 mg Subcutaneous Q24H  . estradiol  1 Applicatorful Vaginal Once per day on Mon Wed Fri  . famotidine  20 mg Oral BID  . insulin aspart  0-9 Units Subcutaneous TID WC  . magnesium oxide  400 mg Oral Daily  . omega-3 acid ethyl esters   Oral Daily  . oxybutynin  5 mg Oral BID    Review of Systems - 11 systems reviewed and negative per HPI   OBJECTIVE: Temp:  [99 F (37.2 C)-103.1 F (39.5 C)] 99 F (37.2 C) (08/22 0455) Pulse Rate:  [100-103] 103 (08/22 0455) Resp:  [18] 18 (08/22 0455) BP: (99-116)/(50-55) 116/55 mmHg (  08/22 0455) SpO2:  [95 %] 95 % (08/22 0455) Physical Exam  Constitutional:  oriented to person, place, and time. appears moderately ill HENT: [place]/AT, PERRLA, no scleral icterus Mouth/Throat: Oropharynx is clear and dry. No oropharyngeal exudate.  Cardiovascular: Normal rate, regular rhythm and normal heart sounds.1/6 sm Pulmonary/Chest: Effort normal bibasilar crackles mild Neck  supple, no nuchal rigidity Abdominal: Soft. Bowel sounds are normal.  exhibits no distension. There is no tenderness.  Lymphadenopathy: no cervical adenopathy. No axillary adenopathy Neurological: alert and oriented to person, place, and time.  Skin: Skin is warm and dry. R lateral calf with a 1 cm vesicle like lesion, dark centered. Psychiatric: a normal mood and affect.  behavior is normal.    LABS: Results for orders placed or performed during the  hospital encounter of 02/19/15 (from the past 48 hour(s))  Basic metabolic panel     Status: Abnormal   Collection Time: 02/20/15  2:47 PM  Result Value Ref Range   Sodium 128 (L) 135 - 145 mmol/L   Potassium 3.8 3.5 - 5.1 mmol/L   Chloride 101 101 - 111 mmol/L   CO2 21 (L) 22 - 32 mmol/L   Glucose, Bld 95 65 - 99 mg/dL   BUN 17 6 - 20 mg/dL   Creatinine, Ser 1.17 (H) 0.44 - 1.00 mg/dL   Calcium 7.3 (L) 8.9 - 10.3 mg/dL   GFR calc non Af Amer 45 (L) >60 mL/min   GFR calc Af Amer 52 (L) >60 mL/min    Comment: (NOTE) The eGFR has been calculated using the CKD EPI equation. This calculation has not been validated in all clinical situations. eGFR's persistently <60 mL/min signify possible Chronic Kidney Disease.    Anion gap 6 5 - 15  Culture, blood (routine x 2)     Status: None (Preliminary result)   Collection Time: 02/20/15  3:19 PM  Result Value Ref Range   Specimen Description BLOOD RIGHT ARM    Special Requests BAA,5ML,ANA,AER    Culture NO GROWTH 2 DAYS    Report Status PENDING   Culture, blood (routine x 2)     Status: None (Preliminary result)   Collection Time: 02/20/15  3:22 PM  Result Value Ref Range   Specimen Description BLOOD LEFT ASSIST CONTROL    Special Requests BAA,7CCAER,5MLANA    Culture NO GROWTH 2 DAYS    Report Status PENDING   Glucose, capillary     Status: None   Collection Time: 02/20/15  4:11 PM  Result Value Ref Range   Glucose-Capillary 96 65 - 99 mg/dL  Glucose, capillary     Status: Abnormal   Collection Time: 02/20/15 10:01 PM  Result Value Ref Range   Glucose-Capillary 107 (H) 65 - 99 mg/dL  Glucose, capillary     Status: Abnormal   Collection Time: 02/21/15  7:14 AM  Result Value Ref Range   Glucose-Capillary 115 (H) 65 - 99 mg/dL   Comment 1 Notify RN   Basic metabolic panel     Status: Abnormal   Collection Time: 02/21/15  7:29 AM  Result Value Ref Range   Sodium 130 (L) 135 - 145 mmol/L   Potassium 3.8 3.5 - 5.1 mmol/L   Chloride  105 101 - 111 mmol/L   CO2 19 (L) 22 - 32 mmol/L   Glucose, Bld 119 (H) 65 - 99 mg/dL   BUN 11 6 - 20 mg/dL   Creatinine, Ser 1.04 (H) 0.44 - 1.00 mg/dL   Calcium 7.2 (  L) 8.9 - 10.3 mg/dL   GFR calc non Af Amer 52 (L) >60 mL/min   GFR calc Af Amer 60 (L) >60 mL/min    Comment: (NOTE) The eGFR has been calculated using the CKD EPI equation. This calculation has not been validated in all clinical situations. eGFR's persistently <60 mL/min signify possible Chronic Kidney Disease.    Anion gap 6 5 - 15  Hemoglobin A1c     Status: Abnormal   Collection Time: 02/21/15  7:29 AM  Result Value Ref Range   Hgb A1c MFr Bld 6.3 (H) 4.0 - 6.0 %  Influenza A&B Antigens ([facility] only)     Status: None   Collection Time: 02/21/15  1:26 PM  Result Value Ref Range   Influenza A ([facility]) NEGATIVE    Influenza B ([facility]) NEGATIVE   Glucose, capillary     Status: Abnormal   Collection Time: 02/21/15  1:39 PM  Result Value Ref Range   Glucose-Capillary 105 (H) 65 - 99 mg/dL  Glucose, capillary     Status: Abnormal   Collection Time: 02/21/15  4:05 PM  Result Value Ref Range   Glucose-Capillary 135 (H) 65 - 99 mg/dL   Comment 1 Notify RN   Glucose, capillary     Status: Abnormal   Collection Time: 02/21/15 10:11 PM  Result Value Ref Range   Glucose-Capillary 132 (H) 65 - 99 mg/dL   Comment 1 Notify RN   Glucose, capillary     Status: Abnormal   Collection Time: 02/22/15 10:55 AM  Result Value Ref Range   Glucose-Capillary 123 (H) 65 - 99 mg/dL  Glucose, capillary     Status: Abnormal   Collection Time: 02/22/15 11:26 AM  Result Value Ref Range   Glucose-Capillary 120 (H) 65 - 99 mg/dL   No components found for: ESR, C REACTIVE PROTEIN MICRO: Recent Results (from the past 720 hour(s))  Urine culture     Status: None   Collection Time: 02/20/15 12:06 AM  Result Value Ref Range Status   Specimen Description URINE, CLEAN CATCH  Final   Special Requests NONE  Final   Culture MULTIPLE SPECIES  PRESENT, SUGGEST RECOLLECTION  Final   Report Status 02/22/2015 FINAL  Final  Culture, blood (routine x 2)     Status: None (Preliminary result)   Collection Time: 02/20/15  3:19 PM  Result Value Ref Range Status   Specimen Description BLOOD RIGHT ARM  Final   Special Requests BAA,5ML,ANA,AER  Final   Culture NO GROWTH 2 DAYS  Final   Report Status PENDING  Incomplete  Culture, blood (routine x 2)     Status: None (Preliminary result)   Collection Time: 02/20/15  3:22 PM  Result Value Ref Range Status   Specimen Description BLOOD LEFT ASSIST CONTROL  Final   Special Requests BAA,7CCAER,5MLANA  Final   Culture NO GROWTH 2 DAYS  Final   Report Status PENDING  Incomplete  Influenza A&B Antigens ([place] only)     Status: None   Collection Time: 02/21/15  1:26 PM  Result Value Ref Range Status   Influenza A ([facility]) NEGATIVE  Final   Influenza B ([facility]) NEGATIVE  Final    IMAGING: Dg Chest 2 View  02/20/2015   CLINICAL DATA:  Fever, nausea, body aches for 6 days.  EXAM: CHEST  2 VIEW  COMPARISON:  08/16/2010  FINDINGS: The heart size and mediastinal contours are within normal limits. Both lungs are clear. No evidence pleural effusion. Mild thoracic  spine degenerative changes again noted.  IMPRESSION: No active cardiopulmonary disease.   Electronically Signed   By: Earle Gell M.D.   On: 02/20/2015 14:55    Assessment:   Patricia Lucas is a 74 y.o. female with Psoriatic arthritis, well controlled DM admitted with a week of fevers and arthralgias as well as TCP, hyponatremia.  She has 2 new kittens at home, does mow lawn every five days but does not recall a tick bite. Some contact with children but no known sick contacts.  On exam she appears moderately ill, has mild crackles at bases, a L calf vesicular like lesion (there for several week per report) Diff includes tick borne, cat scratch disease, PNA, endocarditis, viral (CMV, EBV, Parvo)  Recommendations Continue ceftriaxone and doxy Tick  serologies pending Check CSD serology and EBV, CMV PCR Check LFTs and diff on Cbc Echo pending Await culture results Thank you very much for allowing me to participate in the care of this patient. Please call with questions.   Cheral Marker. Ola Spurr, MD

## 2015-02-22 NOTE — Care Management Note (Signed)
Case Management Note  Patient Details  Name: LACHELLE RISSLER MRN: [id number] Date of Birth: Aug 13, 1940  Subjective/Objective:        74yo Ms Momo Braun was admitted 02/19/15 per low sodium, spiking temps up to 103, and painful psoriatic arthritis. Lives alone but has a sister, Dannielle Huh who checks on her. PCP=David Bronstein. Pharmacy=[facility] in [place]. Only assistive equipment at home is a cane. No current home health services but chose [facility] to be her provider if home health is ordered. Case Management will follow for discharge planning.            Action/Plan:   Expected Discharge Date:  02/20/15               Expected Discharge Plan:     In-House Referral:     Discharge planning Services     Post Acute Care Choice:    Choice offered to:     DME Arranged:    DME Agency:     HH Arranged:    HH Agency:     Status of Service:     Medicare Important Message Given:    Date Medicare IM Given:    Medicare IM give by:    Date Additional Medicare IM Given:    Additional Medicare Important Message give by:     If discussed at Long Length of Stay Meetings, dates discussed:    Additional Comments:  Juliann Olesky A, RN 02/22/2015, 2:33 PM

## 2015-02-23 LAB — BASIC METABOLIC PANEL
ANION GAP: 7 (ref 5–15)
BUN: 10 mg/dL (ref 6–20)
CALCIUM: 8 mg/dL — AB (ref 8.9–10.3)
CO2: 23 mmol/L (ref 22–32)
CREATININE: 0.87 mg/dL (ref 0.44–1.00)
Chloride: 102 mmol/L (ref 101–111)
GFR calc Af Amer: >60 mL/min (ref >60)
GFR calc non Af Amer: >60 mL/min (ref >60)
GLUCOSE: 114 mg/dL — AB (ref 65–99)
Potassium: 4.6 mmol/L (ref 3.5–5.1)
Sodium: 132 mmol/L — ABNORMAL LOW (ref 135–145)

## 2015-02-23 LAB — GLUCOSE, CAPILLARY
GLUCOSE-CAPILLARY: 101 mg/dL — AB (ref 65–99)
GLUCOSE-CAPILLARY: 119 mg/dL — AB (ref 65–99)
Glucose-Capillary: 126 mg/dL — ABNORMAL HIGH (ref 65–99)
Glucose-Capillary: 99 mg/dL (ref 65–99)

## 2015-02-23 LAB — CBC
HEMATOCRIT: 28.5 % — AB (ref 35.0–47.0)
Hemoglobin: 9.5 g/dL — ABNORMAL LOW (ref 12.0–16.0)
MCH: 29.1 pg (ref 26.0–34.0)
MCHC: 33.4 g/dL (ref 32.0–36.0)
MCV: 87 fL (ref 80.0–100.0)
Platelets: 166 10*3/uL (ref 150–440)
RBC: 3.27 MIL/uL — ABNORMAL LOW (ref 3.80–5.20)
RDW: 14.9 % — AB (ref 11.5–14.5)
WBC: 7.9 10*3/uL (ref 3.6–11.0)

## 2015-02-23 LAB — CMV (CYTOMEGALOVIRUS) DNA ULTRAQUANT, PCR
CMV DNA Quant: NEGATIVE IU/mL
Log10 CMV Qn DNA Pl: UNDETERMINED log10 IU/mL

## 2015-02-23 LAB — BARTONELLA ANITBODY PANEL
B HENSELAE IGG: NEGATIVE {titer}
B HENSELAE IGM: NEGATIVE {titer}

## 2015-02-23 LAB — BARTONELLA ANTIBODY PANEL
B Quintana IgM: NEGATIVE titer
B quintana IgG: NEGATIVE titer

## 2015-02-23 MED ORDER — DEXTROSE 5 % IV SOLN
1.0000 g | INTRAVENOUS | Status: DC
  Administered 2015-02-23: 1 g via INTRAVENOUS
  Filled 2015-02-23 (×2): qty 10

## 2015-02-23 MED ORDER — HYDROCORTISONE 1 % EX CREA
TOPICAL_CREAM | Freq: Two times a day (BID) | CUTANEOUS | Status: AC
  Administered 2015-02-23 (×2): via TOPICAL
  Administered 2015-02-24: 1 via TOPICAL
  Filled 2015-02-23: qty 28

## 2015-02-23 NOTE — Care Management Important Message (Signed)
Important Message  Patient Details  Name: Patricia Lucas MRN: [id number] Date of Birth: 10-Mar-1941   Medicare Important Message Given:  Yes-second notification given    Verita Schneiders Allmond 02/23/2015, 9:32 AM

## 2015-02-23 NOTE — Progress Notes (Signed)
[facility] INFECTIOUS DISEASE PROGRESS NOTE Date of Admission:  02/19/2015     ID: Patricia Lucas is a 74 y.o. female with fevers  Active Problems:   Hyponatremia   Fever of unknown origin  Subjective: No further fevers. Feels a little better.   ROS  Eleven systems are reviewed and negative except per hpi  Medications:  Antibiotics Given (last 72 hours)    Date/Time Action Medication Dose Rate   02/20/15 1528 Given   cefTRIAXone (ROCEPHIN) 1 g in dextrose 5 % 50 mL IVPB 1 g 100 mL/hr   02/21/15 1015 Given   doxycycline (VIBRA-TABS) tablet 100 mg 100 mg    02/21/15 2015 Given   doxycycline (VIBRA-TABS) tablet 100 mg 100 mg    02/22/15 1043 Given   doxycycline (VIBRA-TABS) tablet 100 mg 100 mg    02/22/15 2133 Given   doxycycline (VIBRA-TABS) tablet 100 mg 100 mg    02/23/15 0944 Given   doxycycline (VIBRA-TABS) tablet 100 mg 100 mg      . cholecalciferol  1,000 Units Oral QHS  . doxycycline  100 mg Oral Q12H  . enoxaparin (LOVENOX) injection  40 mg Subcutaneous Q24H  . estradiol  1 Applicatorful Vaginal Once per day on Mon Wed Fri  . famotidine  20 mg Oral BID  . insulin aspart  0-9 Units Subcutaneous TID WC  . magnesium oxide  400 mg Oral Daily  . omega-3 acid ethyl esters   Oral Daily  . oxybutynin  5 mg Oral BID    Objective: Vital signs in last 24 hours: Temp:  [98.5 F (36.9 C)-99.2 F (37.3 C)] 99.2 F (37.3 C) (08/23 1247) Pulse Rate:  [87-95] 87 (08/23 1247) Resp:  [18] 18 (08/23 1247) BP: (106-120)/(60-73) 116/62 mmHg (08/23 1247) SpO2:  [99 %-100 %] 100 % (08/23 1247) Constitutional: oriented to person, place, and time. appears midly ill HENT: [place]/AT, PERRLA, no scleral icterus Mouth/Throat: Oropharynx is clear and dry. No oropharyngeal exudate.  Cardiovascular: Normal rate, regular rhythm and normal heart sounds.1/6 sm Pulmonary/Chest: Effort normal bibasilar crackles mild Neck supple, no nuchal rigidity Abdominal: Soft. Bowel sounds are  normal. exhibits no distension. There is no tenderness.  Lymphadenopathy: no cervical adenopathy. No axillary adenopathy Neurological: alert and oriented to person, place, and time.  Skin: Skin is warm and dry. R lateral calf with a 1 cm vesicle like lesion, dark centered. Psychiatric: a normal mood and affect. behavior is normal.   Lab Results  Recent Labs  02/21/15 0729 02/22/15 1548 02/23/15 0440  WBC  --  7.7 7.9  HGB  --  9.3* 9.5*  HCT  --  27.7* 28.5*  NA 130*  --  132*  K 3.8  --  4.6  CL 105  --  102  CO2 19*  --  23  BUN 11  --  10  CREATININE 1.04*  --  0.87    Microbiology: Results for orders placed or performed during the hospital encounter of 02/19/15  Urine culture     Status: None   Collection Time: 02/20/15 12:06 AM  Result Value Ref Range Status   Specimen Description URINE, CLEAN CATCH  Final   Special Requests NONE  Final   Culture MULTIPLE SPECIES PRESENT, SUGGEST RECOLLECTION  Final   Report Status 02/22/2015 FINAL  Final  Culture, blood (routine x 2)     Status: None (Preliminary result)   Collection Time: 02/20/15  3:19 PM  Result Value Ref Range Status   Specimen  Description BLOOD RIGHT ARM  Final   Special Requests BAA,5ML,ANA,AER  Final   Culture NO GROWTH 3 DAYS  Final   Report Status PENDING  Incomplete  Culture, blood (routine x 2)     Status: None (Preliminary result)   Collection Time: 02/20/15  3:22 PM  Result Value Ref Range Status   Specimen Description BLOOD LEFT ASSIST CONTROL  Final   Special Requests BAA,7CCAER,5MLANA  Final   Culture NO GROWTH 3 DAYS  Final   Report Status PENDING  Incomplete  Influenza A&B Antigens ([facility] only)     Status: None   Collection Time: 02/21/15  1:26 PM  Result Value Ref Range Status   Influenza A ([facility]) NEGATIVE  Final   Influenza B ([facility]) NEGATIVE  Final    Studies/Results: Dg Chest 2 View  02/20/2015   CLINICAL DATA:  Fever, nausea, body aches for 6 days.  EXAM: CHEST  2 VIEW  COMPARISON:   08/16/2010  FINDINGS: The heart size and mediastinal contours are within normal limits. Both lungs are clear. No evidence pleural effusion. Mild thoracic spine degenerative changes again noted.  IMPRESSION: No active cardiopulmonary disease.   Electronically Signed   By: Myles Rosenthal M.D.   On: 02/20/2015 14:55     Assessment/Plan: Patricia Lucas is a 74 y.o. female with Psoriatic arthritis, well controlled DM admitted with a week of fevers and arthralgias as well as TCP, hyponatremia. She has 2 new kittens at home, does mow lawn every five days but does not recall a tick bite. Some contact with children but no known sick contacts. On exam she appears moderately ill, has mild crackles at bases, a L calf vesicular like lesion (there for several week per report). Echo neg for vegetation Diff includes tick borne, cat scratch disease, PNA, endocarditis, viral (CMV, EBV, Parvo) Afebrile now for 36 hours. Bartonella  Serology negative. Echo neg for vegetation. BCX negative  Recommendations Continue ceftriaxone and doxy but if improving can dc tomorrow on doxy for another 4 days   I can see in 1-2 weeks after dc Tick serologies pending EBV, CMV PCR pending Thank you very much for the consult. Will follow with you.  Jerad Dunlap   02/23/2015, 2:10 PM

## 2015-02-23 NOTE — Progress Notes (Addendum)
[facility] - [place] [facility]   PATIENT NAME: Patricia Lucas    MR#:  [id number]  DATE OF BIRTH:  Sep 14, 1940  SUBJECTIVE:  CHIEF COMPLAINT:   Chief Complaint  Patient presents with  . Generalized Body Aches  . Fever   Patient is 74 year old female with history of diabetes, dyspepsia, recurrent UTIs, presented to the hospital with feeling feverish and chilly , having body aches and was noted to have sodium level of 125. Receiving normal saline infusion, patient's sodium level improved initially but then declined. Was noted to have a high fevers and remains febrile to 103 over the past 24 hours, has been managed on the Rocephin over the past 24 hours,  despite Rocephin remains febrile. Urine cultures showed insignificant growth. Blood cultures taken yesterday, 02/20/2015 showed no growth.  Feels better.  No fever.  Reports worsening of maculopapular rash in the back, which is itching.  Also, more erythematous than yesterday   Review of Systems  Constitutional: Positive for fever and chills. Negative for weight loss.  HENT: Negative for congestion.   Eyes: Negative for blurred vision and double vision.  Respiratory: Negative for cough, sputum production, shortness of breath and wheezing.   Cardiovascular: Negative for chest pain, palpitations, orthopnea, leg swelling and PND.  Gastrointestinal: Negative for nausea, vomiting, abdominal pain, diarrhea, constipation and blood in stool.  Genitourinary: Negative for dysuria, urgency, frequency and hematuria.  Musculoskeletal: Positive for myalgias and joint pain. Negative for falls.  Neurological: Negative for dizziness, tremors, focal weakness and headaches.  Endo/Heme/Allergies: Does not bruise/bleed easily.  Psychiatric/Behavioral: Negative for depression. The patient does not have insomnia.     VITAL SIGNS: Blood pressure 116/62, pulse 87, temperature 99.2 F (37.3 C), temperature source Oral, resp. rate  18, height  (1.575 m), weight 70.625 kg (155 lb 11.2 oz), SpO2 100 %.  PHYSICAL EXAMINATION:   GENERAL:  74 y.o.-year-old patient lying in the bed with no acute distress. Profoundly diaphoretic EYES: Pupils equal, round, reactive to light and accommodation. No scleral icterus. Extraocular muscles intact.  HEENT: Head atraumatic, normocephalic. Oropharynx and nasopharynx clear.  NECK:  Supple, no jugular venous distention. No thyroid enlargement, no tenderness.  LUNGS: Normal breath sounds bilaterally, no wheezing, rales,rhonchi or crepitation. No use of accessory muscles of respiration. Few Crackles scattered in all lung fields bilaterally CARDIOVASCULAR: S1, S2 normal. No murmurs, rubs, or gallops.  ABDOMEN: Soft, nontender, nondistended. Bowel sounds present. No organomegaly or mass.  EXTREMITIES: No pedal edema, cyanosis, or clubbing.  NEUROLOGIC: Cranial nerves II through XII are intact. Muscle strength 5/5 in all extremities. Sensation intact. Gait not checked.  PSYCHIATRIC: The patient is alert and oriented x 3.  SKIN: Psoriasis rash +, lesion, or ulcer.   ORDERS/RESULTS REVIEWED:   CBC  Recent Labs Lab 02/19/15 2019 02/20/15 0444 02/22/15 1548 02/23/15 0440  WBC 7.5 7.6 7.7 7.9  HGB 10.6* 10.2* 9.3* 9.5*  HCT 31.1* 30.4* 27.7* 28.5*  PLT 135* 133* 151 166  MCV 86.8 86.9 86.8 87.0  MCH 29.6 29.1 29.1 29.1  MCHC 34.1 33.5 33.5 33.4  RDW 14.4 14.8* 14.8* 14.9*  LYMPHSABS  --   --  3.0  --   MONOABS  --   --  1.0*  --   EOSABS  --   --  0.2  --   BASOSABS  --   --  0.1  --    ------------------------------------------------------------------------------------------------------------------  Chemistries   Recent Labs Lab 02/19/15 2019 02/20/15 0444 02/20/15  1447 02/21/15 0729 02/22/15 1548 02/23/15 0440  NA  --  130* 128* 130*  --  132*  K  --  4.4 3.8 3.8  --  4.6  CL  --  101 101 105  --  102  CO2  --  23 21* 19*  --  23  GLUCOSE  --  114* 95 119*  --   114*  BUN  --  22* 17 11  --  10  CREATININE 1.40* 1.15* 1.17* 1.04*  --  0.87  CALCIUM  --  7.6* 7.3* 7.2*  --  8.0*  AST  --   --   --   --  43*  --   ALT  --   --   --   --  27  --   ALKPHOS  --   --   --   --  43  --   BILITOT  --   --   --   --  0.2*  --    ------------------------------------------------------------------------------------------------------------------ estimated creatinine clearance is 52.2 mL/min (by C-G formula based on Cr of 0.87). ------------------------------------------------------------------------------------------------------------------ No results for input(s): TSH, T4TOTAL, T3FREE, THYROIDAB in the last 72 hours.  Invalid input(s): FREET3  RADIOLOGY: No results found.  ASSESSMENT AND PLAN:  Active Problems:   Hyponatremia   Fever of unknown origin 1. Fever of unclear etiology at this time, suspected infectious. Chest x-ray showed no pneumonia, blood cultures are negative so far. Urine culture is unremarkable. Getting infectious disease specialist involved for further recommendations. Getting serology for Ehrlichia,  Lyme's disease as well as [facility] spotted fever.  Appreciate Dr. Sampson Goon.  Recommendation.  Continue Rocephin and doxycycline.  She remains afebrile.  Plan is to discharge her tomorrow on oral doxycycline for another 4 days and outpatient follow-up with him in a week to check on serologies which has been ordered  2. 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  3. Acute Renal insufficiency: Resolved  4. Diabetes mellitus. Continue outpatient medications as well as sliding scale insulin. hemoglobin A1c 6.3  5. Anemia: Likely of chronic disease, seems stable.  No obvious gross GI bleeding  6.  Maculopapular rash in the back: Minimal erythema, no signs of infection, she does a lot of itching - will try low-dose hydrocortisone cream   Management plans discussed with the patient, and her daughter Judieth Keens who is  in agreement.  Discussed with Dr. Sampson Goon   DRUG ALLERGIES: No Known Allergies  CODE STATUS: Full code  TOTAL TIME TAKING CARE OF THIS PATIENT: 30 minutes.   Likely discharge tomorrow morning if she remains afebrile on oral doxycycline for 4 days.   [facility], Audrianna Driskill M.D on 02/23/2015 at 4:18 PM  Between 7am to 6pm - Pager - [phone]  After 6pm go to [email or web address] - password [email or web address] [facility]  [facility]  Office  [phone]  CC: Primary care physician; Dorothey Baseman, MD

## 2015-02-23 NOTE — Plan of Care (Signed)
Problem: Discharge Progression Outcomes Goal: Other Discharge Outcomes/Goals Outcome: Progressing Pain medication given with good relief. VSS. Tolerating diet, no complaints of nausea. Up walking in room with daughter. Sodium improved to 132. Possible discharge tomorrow 02/24/15.

## 2015-02-23 NOTE — Plan of Care (Signed)
Problem: Discharge Progression Outcomes Goal: Other Discharge Outcomes/Goals Plan of care progress to goal: - Flexeril given for muscle spasm with improvement. - Assist to bathroom. - No fever noted this shift. Will continue to monitor.

## 2015-02-24 LAB — EHRLICHIA ANTIBODY PANEL
E CHAFFEENSIS AB, IGG: NEGATIVE
E chaffeensis (HGE) Ab, IgM: 1:64 {titer} — ABNORMAL HIGH
E.Chaffeensis (HME) IgG: NEGATIVE

## 2015-02-24 LAB — ROCKY MTN SPOTTED FVR ABS PNL(IGG+IGM)
RMSF IGM: 0.26 {index} (ref 0.00–0.89)
RMSF IgG: NEGATIVE

## 2015-02-24 LAB — B. BURGDORFI ANTIBODIES: B burgdorferi Ab IgG+IgM: <0.91 {ISR} (ref 0.00–0.90)

## 2015-02-24 LAB — GLUCOSE, CAPILLARY: Glucose-Capillary: 113 mg/dL — ABNORMAL HIGH (ref 65–99)

## 2015-02-24 MED ORDER — DOXYCYCLINE HYCLATE 100 MG PO TABS: 100.0000 mg | ORAL_TABLET | Freq: Two times a day (BID) | ORAL | Status: DC

## 2015-02-24 MED ORDER — HYDROCORTISONE 1 % EX CREA: TOPICAL_CREAM | Freq: Two times a day (BID) | CUTANEOUS | Status: DC

## 2015-02-24 NOTE — Plan of Care (Signed)
Problem: Discharge Progression Outcomes Goal: Other Discharge Outcomes/Goals Outcome: Progressing Plan of care progress to goal: Pain - pt complains of no pain VSS Complications - pt on fluid restriction, continue to put hydrocortisone cream on back for rash Activity - pt up ad lib

## 2015-02-24 NOTE — Progress Notes (Signed)
Discharge instructions explained to pt and pts daughter/ verbalized an understanding/ iv and tele removed/ transpoted off unit via wheelchair.

## 2015-02-24 NOTE — Discharge Instructions (Signed)

## 2015-02-25 LAB — MISC LABCORP TEST (SEND OUT)
LabCorp test name: 138230
Labcorp test code: 138230

## 2015-02-25 LAB — CULTURE, BLOOD (ROUTINE X 2)
CULTURE: NO GROWTH
CULTURE: NO GROWTH

## 2015-02-28 NOTE — Discharge Summary (Signed)
[facility] -  [place] [facility]   PATIENT NAME: Patricia Lucas    MR#:  [id number]  DATE OF BIRTH:  06/24/1941  DATE OF ADMISSION:  02/19/2015 ADMITTING PHYSICIAN: Auburn Bilberry, MD  DATE OF DISCHARGE: 02/24/2015 10:00 AM  PRIMARY CARE PHYSICIAN: Dorothey Baseman, MD    ADMISSION DIAGNOSIS:  Hyponatremia [E87.1] DISCHARGE DIAGNOSIS:  Active Problems:   Hyponatremia   Fever of unknown origin  SECONDARY DIAGNOSIS:   Past Medical History  Diagnosis Date  . Diabetes mellitus without complication   . UTI (lower urinary tract infection)   . Psoriasis   . Psoriatic arthritis   . Generalized OA   . Osteoporosis   . Chronic cystitis   . Colon polyp    HOSPITAL COURSE:  74 year old white female with history of diabetes, recurrent UTIs presents with fever fatigue was admitted for  Symptomatic hyponatremia. Please see Dr Eliane Decree dictated H & P for further details. 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 She continued to spike fever but didn't have any obvious source. ID cs was obtained with Dr Sampson Goon who recommended treatment with IV rocephin and doxy while in the hospital. She responded well to such treatment. Remained afebrile for about 36 hrs and was discharged on PO doxycycline in stable condition with close outpt f/up with him to look into serologies which were ordered while in the Hospital to tailor her regimen.  She was feeling much better by 24th Aug and was D/C home in stable condition. She was agreeable with D/C plans. DISCHARGE CONDITIONS:  GOOD CONSULTS OBTAINED:  Treatment Team:  Auburn Bilberry, MD Clydie Braun, MD DRUG ALLERGIES:  No Known Allergies DISCHARGE MEDICATIONS:   Discharge Medication List as of 02/24/2015  8:46 AM    START taking these medications   Details  doxycycline (VIBRA-TABS) 100 MG tablet Take 1 tablet (100 mg total) by mouth every 12 (twelve)  hours., Starting 02/24/2015, Until Discontinued, Normal    hydrocortisone cream 1 % Apply topically 2 (two) times daily., Starting 02/24/2015, Until Discontinued, Normal      CONTINUE these medications which have NOT CHANGED   Details  Biotin 10 MG CAPS Take 10 mg by mouth daily., Until Discontinued, Historical Med    Cholecalciferol (VITAMIN D3) 1000 UNITS CAPS Take 1,000 Units by mouth at bedtime., Until Discontinued, Historical Med    Cranberry 500 MG CAPS Take 500 mg by mouth daily., Until Discontinued, Historical Med    cyclobenzaprine (FLEXERIL) 10 MG tablet Take 10 mg by mouth 3 (three) times daily as needed for muscle spasms., Until Discontinued, Historical Med    estradiol (ESTRACE) 0.1 MG/GM vaginal cream Place 1 Applicatorful vaginally 3 (three) times a week. Pt uses on Monday, Wednesday, and Friday., Until Discontinued, Historical Med    HYDROcodone-acetaminophen (NORCO/VICODIN) 5-325 MG per tablet Take 1 tablet by mouth every 4 (four) hours as needed for moderate pain., Until Discontinued, Historical Med    magnesium oxide (MAG-OX) 400 MG tablet Take 400 mg by mouth daily., Until Discontinued, Historical Med    metFORMIN (GLUCOPHAGE) 500 MG tablet Take 500 mg by mouth 2 (two) times daily., Until Discontinued, Historical Med    Omega-3 Fatty Acids (OMEGA 3 PO) Take 1 capsule by mouth daily., Until Discontinued, Historical Med    oxybutynin (DITROPAN-XL) 5 MG 24 hr tablet Take 5 mg by mouth 2 (two) times daily., Until Discontinued, Historical Med    phenazopyridine (PYRIDIUM) 200 MG tablet Take  200 mg by mouth 3 (three) times daily as needed for pain., Until Discontinued, Historical Med    ranitidine (ZANTAC) 150 MG tablet Take 150 mg by mouth 2 (two) times daily., Until Discontinued, Historical Med    sulfamethoxazole-trimethoprim (BACTRIM,SEPTRA) 400-80 MG per tablet Take 1 tablet by mouth 2 (two) times daily., Until Discontinued, Historical Med    traMADol (ULTRAM) 50 MG  tablet Take 50 mg by mouth every 6 (six) hours as needed for moderate pain., Until Discontinued, Historical Med    zolpidem (AMBIEN) 5 MG tablet Take 5 mg by mouth at bedtime as needed for sleep., Until Discontinued, Historical Med       DISCHARGE INSTRUCTIONS:   DIET:  Regular diet  DISCHARGE CONDITION:  Good  ACTIVITY:  Activity as tolerated  OXYGEN:  Home Oxygen: No.   Oxygen Delivery: room air  DISCHARGE LOCATION:  home   If you experience worsening of your admission symptoms, develop shortness of breath, life threatening emergency, suicidal or homicidal thoughts you must seek medical attention immediately by calling 911 or calling your MD immediately  if symptoms less severe.  You Must read complete instructions/literature along with all the possible adverse reactions/side effects for all the Medicines you take and that have been prescribed to you. Take any new Medicines after you have completely understood and accpet all the possible adverse reactions/side effects.   Please note  You were cared for by a hospitalist during your hospital stay. If you have any questions about your discharge medications or the care you received while you were in the hospital after you are discharged, you can call the unit and asked to speak with the hospitalist on call if the hospitalist that took care of you is not available. Once you are discharged, your primary care physician will handle any further medical issues. Please note that NO REFILLS for any discharge medications will be authorized once you are discharged, as it is imperative that you return to your primary care physician (or establish a relationship with a primary care physician if you do not have one) for your aftercare needs so that they can reassess your need for medications and monitor your lab values.   On the day of Discharge: VITAL SIGNS:  Blood pressure 99/61, pulse 95, temperature 98.7 F (37.1 C), temperature source Oral,  resp. rate 18, height 5\' 2"  (1.575 m), weight 70.625 kg (155 lb 11.2 oz), SpO2 95 %. PHYSICAL EXAMINATION:  GENERAL:  74 y.o.-year-old patient lying in the bed with no acute distress.  EYES: Pupils equal, round, reactive to light and accommodation. No scleral icterus. Extraocular muscles intact.  HEENT: Head atraumatic, normocephalic. Oropharynx and nasopharynx clear.  NECK:  Supple, no jugular venous distention. No thyroid enlargement, no tenderness.  LUNGS: Normal breath sounds bilaterally, no wheezing, rales,rhonchi or crepitation. No use of accessory muscles of respiration.  CARDIOVASCULAR: S1, S2 normal. No murmurs, rubs, or gallops.  ABDOMEN: Soft, non-tender, non-distended. Bowel sounds present. No organomegaly or mass.  EXTREMITIES: No pedal edema, cyanosis, or clubbing.  NEUROLOGIC: Cranial nerves II through XII are intact. Muscle strength 5/5 in all extremities. Sensation intact. Gait not checked.  PSYCHIATRIC: The patient is alert and oriented x 3.  SKIN: No obvious rash, lesion, or ulcer.  DATA REVIEW:   CBC  Recent Labs Lab 02/23/15 0440  WBC 7.9  HGB 9.5*  HCT 28.5*  PLT 166    Chemistries   Recent Labs Lab 02/22/15 1548 02/23/15 0440  NA  --  132*  K  --  4.6  CL  --  102  CO2  --  23  GLUCOSE  --  114*  BUN  --  10  CREATININE  --  0.87  CALCIUM  --  8.0*  AST 43*  --   ALT 27  --   ALKPHOS 43  --   BILITOT 0.2*  --     Microbiology Results  Results for orders placed or performed during the hospital encounter of 02/19/15  Urine culture     Status: None   Collection Time: 02/20/15 12:06 AM  Result Value Ref Range Status   Specimen Description URINE, CLEAN CATCH  Final   Special Requests NONE  Final   Culture MULTIPLE SPECIES PRESENT, SUGGEST RECOLLECTION  Final   Report Status 02/22/2015 FINAL  Final  Culture, blood (routine x 2)     Status: None   Collection Time: 02/20/15  3:19 PM  Result Value Ref Range Status   Specimen Description BLOOD  RIGHT ARM  Final   Special Requests BAA,5ML,ANA,AER  Final   Culture NO GROWTH 5 DAYS  Final   Report Status 02/25/2015 FINAL  Final  Culture, blood (routine x 2)     Status: None   Collection Time: 02/20/15  3:22 PM  Result Value Ref Range Status   Specimen Description BLOOD LEFT ASSIST CONTROL  Final   Special Requests BAA,7CCAER,5MLANA  Final   Culture NO GROWTH 5 DAYS  Final   Report Status 02/25/2015 FINAL  Final  Influenza A&B Antigens ([facility] only)     Status: None   Collection Time: 02/21/15  1:26 PM  Result Value Ref Range Status   Influenza A ([facility]) NEGATIVE  Final   Influenza B [facility]) NEGATIVE  Final    Management plans discussed with the patient, family and they are in agreement.  CODE STATUS:  Advance Directive Documentation        Most Recent Value   Type of Advance Directive  Healthcare Power of Attorney, Living will   Pre-existing out of facility DNR order (yellow form or pink MOST form)     "MOST" Form in Place?        TOTAL TIME TAKING CARE OF THIS PATIENT: 55 minutes.    [facility], Zahi Plaskett M.D on 02/28/2015 at 6:54 PM  Between 7am to 6pm - Pager - [phone]  After 6pm go to [email or web address] - password [email or web address] [facility]  [facility]  Office  [phone]  CC: Primary care physician; Dorothey Baseman, MD Sampson Goon, DAVID MD

## 2015-07-02 DIAGNOSIS — R1013 Epigastric pain: Secondary | ICD-10-CM | POA: Insufficient documentation

## 2015-07-04 DIAGNOSIS — T8859XA Other complications of anesthesia, initial encounter: Secondary | ICD-10-CM

## 2015-07-04 HISTORY — DX: Other complications of anesthesia, initial encounter: T88.59XA

## 2015-07-08 ENCOUNTER — Other Ambulatory Visit: Payer: Self-pay | Admitting: Family Medicine

## 2015-07-08 DIAGNOSIS — Z1231 Encounter for screening mammogram for malignant neoplasm of breast: Secondary | ICD-10-CM

## 2015-07-19 ENCOUNTER — Ambulatory Visit
Admission: RE | Admit: 2015-07-19 | Discharge: 2015-07-19 | Disposition: A | Discharge status: Home / Self Care | Payer: Medicare Other | Source: Ambulatory Visit | Attending: Family Medicine | Admitting: Family Medicine

## 2015-07-19 ENCOUNTER — Other Ambulatory Visit: Payer: Self-pay | Admitting: Family Medicine

## 2015-07-19 DIAGNOSIS — Z1231 Encounter for screening mammogram for malignant neoplasm of breast: Secondary | ICD-10-CM

## 2016-06-14 ENCOUNTER — Other Ambulatory Visit: Payer: Self-pay | Admitting: Family Medicine

## 2016-06-14 DIAGNOSIS — Z1231 Encounter for screening mammogram for malignant neoplasm of breast: Secondary | ICD-10-CM

## 2016-07-21 ENCOUNTER — Ambulatory Visit: Payer: Medicare Other

## 2016-08-14 ENCOUNTER — Ambulatory Visit
Admission: RE | Admit: 2016-08-14 | Discharge: 2016-08-14 | Disposition: A | Discharge status: Home / Self Care | Payer: Medicare Other | Source: Ambulatory Visit | Attending: Family Medicine | Admitting: Family Medicine

## 2016-08-14 DIAGNOSIS — Z1231 Encounter for screening mammogram for malignant neoplasm of breast: Secondary | ICD-10-CM | POA: Diagnosis present

## 2017-04-03 ENCOUNTER — Other Ambulatory Visit: Payer: Self-pay | Admitting: Family Medicine

## 2017-04-03 DIAGNOSIS — Z1239 Encounter for other screening for malignant neoplasm of breast: Secondary | ICD-10-CM

## 2017-08-15 ENCOUNTER — Ambulatory Visit
Admission: RE | Admit: 2017-08-15 | Discharge: 2017-08-15 | Disposition: A | Discharge status: Home / Self Care | Payer: Medicare Other | Source: Ambulatory Visit | Attending: Family Medicine | Admitting: Family Medicine

## 2017-08-15 DIAGNOSIS — Z1231 Encounter for screening mammogram for malignant neoplasm of breast: Secondary | ICD-10-CM | POA: Diagnosis present

## 2017-08-15 DIAGNOSIS — Z1239 Encounter for other screening for malignant neoplasm of breast: Secondary | ICD-10-CM

## 2018-06-27 ENCOUNTER — Other Ambulatory Visit: Payer: Self-pay | Admitting: Rheumatology

## 2018-06-27 DIAGNOSIS — M25512 Pain in left shoulder: Secondary | ICD-10-CM

## 2018-07-11 ENCOUNTER — Ambulatory Visit
Admission: RE | Admit: 2018-07-11 | Discharge: 2018-07-11 | Disposition: A | Discharge status: Home / Self Care | Payer: Medicare Other | Source: Ambulatory Visit | Attending: Rheumatology | Admitting: Rheumatology

## 2018-07-11 DIAGNOSIS — M25512 Pain in left shoulder: Secondary | ICD-10-CM

## 2018-07-22 DIAGNOSIS — M7582 Other shoulder lesions, left shoulder: Secondary | ICD-10-CM | POA: Insufficient documentation

## 2018-07-22 DIAGNOSIS — M75122 Complete rotator cuff tear or rupture of left shoulder, not specified as traumatic: Secondary | ICD-10-CM | POA: Insufficient documentation

## 2018-08-08 ENCOUNTER — Other Ambulatory Visit: Payer: Self-pay

## 2018-08-08 ENCOUNTER — Encounter
Admission: RE | Admit: 2018-08-08 | Discharge: 2018-08-08 | Disposition: A | Discharge status: Home / Self Care | Payer: Medicare Other | Source: Ambulatory Visit | Attending: Surgery | Admitting: Surgery

## 2018-08-08 DIAGNOSIS — Z01818 Encounter for other preprocedural examination: Secondary | ICD-10-CM | POA: Insufficient documentation

## 2018-08-08 HISTORY — DX: Chronic kidney disease, unspecified: N18.9

## 2018-08-08 HISTORY — DX: Adverse effect of unspecified anesthetic, initial encounter: T41.45XA

## 2018-08-08 HISTORY — DX: Gastro-esophageal reflux disease without esophagitis: K21.9

## 2018-08-08 LAB — BASIC METABOLIC PANEL
Anion gap: 11 (ref 5–15)
BUN: 20 mg/dL (ref 8–23)
CO2: 24 mmol/L (ref 22–32)
Calcium: 9.7 mg/dL (ref 8.9–10.3)
Chloride: 102 mmol/L (ref 98–111)
Creatinine, Ser: 0.83 mg/dL (ref 0.44–1.00)
GFR calc Af Amer: >60 mL/min (ref >60)
GFR calc non Af Amer: >60 mL/min (ref >60)
Glucose, Bld: 75 mg/dL (ref 70–99)
Potassium: 4.7 mmol/L (ref 3.5–5.1)
Sodium: 137 mmol/L (ref 135–145)

## 2018-08-08 LAB — CBC
HCT: 34.2 % — ABNORMAL LOW (ref 36.0–46.0)
Hemoglobin: 10.8 g/dL — ABNORMAL LOW (ref 12.0–15.0)
MCH: 29.6 pg (ref 26.0–34.0)
MCHC: 31.6 g/dL (ref 30.0–36.0)
MCV: 93.7 fL (ref 80.0–100.0)
Platelets: 236 10*3/uL (ref 150–400)
RBC: 3.65 MIL/uL — ABNORMAL LOW (ref 3.87–5.11)
RDW: 13 % (ref 11.5–15.5)
WBC: 5.5 10*3/uL (ref 4.0–10.5)
nRBC: 0 % (ref 0.0–0.2)

## 2018-08-08 NOTE — Patient Instructions (Signed)
Your procedure is scheduled on: Thursday, August 15, 2018  Report to THE SECOND FLOOR OF THE MEDICAL MALL    DO NOT STOP ON THE FIRST FLOOR TO REGISTER  To find out your arrival time please call [phone] between 1PM - 3PM on Wednesday, February 12th  Remember: Instructions that are not followed completely may result in serious medical risk,  up to and including death, or upon the discretion of your surgeon and anesthesiologist your  surgery may need to be rescheduled.     _X__ 1. Do not eat food after midnight the night before your procedure.                 No gum chewing or hard candies.                   You may drink clear liquids up to 2 hours before you are scheduled to arrive for your surgery-                   DO not drink clear liquids within 2 hours of the start of your surgery.                  Clear Liquids include:  water, apple juice without pulp, clear carbohydrate                 drink such as Clearfast of Gatorade, Black Coffee or Tea (Do not add                 anything to coffee or tea).  __X__2.  On the morning of surgery brush your teeth with toothpaste and water,                    You may rinse your mouth with mouthwash if you wish.                        Do not swallow any toothpaste of mouthwash.     _X__ 3.  No Alcohol for 24 hours before or after surgery.   _X__ 4.  Do Not Smoke or use e-cigarettes For 24 Hours Prior to Your Surgery.                 Do not use any chewable tobacco products for at least 6 hours prior to                 surgery.  ____  5.  Bring all medications with you on the day of surgery if instructed.   ____  6.  Notify your doctor if there is any change in your medical condition      (cold, fever, infections).     Do not wear jewelry, make-up, hairpins, clips or nail polish. Do not wear lotions, powders, or perfumes. You may wear deodorant. Do not shave 48 hours prior to surgery. Men may shave face  and neck. Do not bring valuables to the hospital.    [facility] belongings or valuables.  Contacts, dentures or bridgework may not be worn into surgery. Leave your suitcase in the car. After surgery it may be brought to your room. For patients admitted to the hospital, discharge time is determined by your treatment team.   Patients discharged the day of surgery will not be allowed to drive home.   Please read over the following fact sheets that you were given:  PREPARING FOR SURGERY  __X__ Take these medicines the morning of surgery with A SIP OF WATER:    1. SYSTANE EYE DROPS  2. TRAMADOL IF NEEDED   3.   4.  5.  6.  ____ Fleet Enema (as directed)   _X___ Use CHG Soap as directed  ____ Use inhalers on the day of surgery  __X__ Stop metformin 2 days prior to surgery               LAST DOSE AFTER DINNER ON Monday, August 12, 2018    ____ Take 1/2 of usual insulin dose the night before surgery. No insulin the morning          of surgery.   __X__ Stop ALL ASPIRIN PRODUCTS NOW  __X__ Stop Anti-inflammatories AS OF NOW   __X__ Stop supplements until after surgery.                  THIS INCLUDES BIOTIN / CRANBERRY / MAGNESIUM / OMEGA 3               DO NOT USE THE BIOFREEZE AFTER YOU USE THE CHG SOAP  ____ Bring C-Pap to the hospital.   CONTINUE TAKING LISINOPRIL BUT DO NOT TAKE IT ON THE MORNING OF SURGERY  CONTINUE TAKING ALL OF YOUR NIGHT TIME MEDICINES AS USUAL.  WEAR A LOOSE SHIRT THAT WOULD BE EASY TO GET INTO

## 2018-08-09 NOTE — Pre-Procedure Instructions (Signed)
CBC results sent to Dr. Joice LoftsPoggi for review.

## 2018-08-14 MED ORDER — CEFAZOLIN SODIUM-DEXTROSE 2-4 GM/100ML-% IV SOLN
2.0000 g | Freq: Once | INTRAVENOUS | Status: AC
  Administered 2018-08-15: 2 g via INTRAVENOUS

## 2018-08-15 ENCOUNTER — Ambulatory Visit: Payer: Medicare Other | Admitting: Certified Registered Nurse Anesthetist

## 2018-08-15 ENCOUNTER — Other Ambulatory Visit: Payer: Self-pay

## 2018-08-15 ENCOUNTER — Encounter
Admission: RE | Disposition: A | Discharge status: Home / Self Care | Payer: Self-pay | Source: Home / Self Care | Attending: Surgery

## 2018-08-15 ENCOUNTER — Ambulatory Visit
Admission: RE | Admit: 2018-08-15 | Discharge: 2018-08-15 | Disposition: A | Discharge status: Home / Self Care | Payer: Medicare Other | Attending: Surgery | Admitting: Surgery

## 2018-08-15 DIAGNOSIS — M75122 Complete rotator cuff tear or rupture of left shoulder, not specified as traumatic: Secondary | ICD-10-CM | POA: Insufficient documentation

## 2018-08-15 DIAGNOSIS — Z7984 Long term (current) use of oral hypoglycemic drugs: Secondary | ICD-10-CM | POA: Insufficient documentation

## 2018-08-15 DIAGNOSIS — Z87891 Personal history of nicotine dependence: Secondary | ICD-10-CM | POA: Diagnosis not present

## 2018-08-15 DIAGNOSIS — K219 Gastro-esophageal reflux disease without esophagitis: Secondary | ICD-10-CM | POA: Diagnosis not present

## 2018-08-15 DIAGNOSIS — E119 Type 2 diabetes mellitus without complications: Secondary | ICD-10-CM | POA: Diagnosis not present

## 2018-08-15 DIAGNOSIS — Z79899 Other long term (current) drug therapy: Secondary | ICD-10-CM | POA: Diagnosis not present

## 2018-08-15 DIAGNOSIS — Z79890 Hormone replacement therapy (postmenopausal): Secondary | ICD-10-CM | POA: Insufficient documentation

## 2018-08-15 DIAGNOSIS — M24112 Other articular cartilage disorders, left shoulder: Secondary | ICD-10-CM | POA: Insufficient documentation

## 2018-08-15 HISTORY — PX: SHOULDER ARTHROSCOPY WITH SUBACROMIAL DECOMPRESSION AND BICEP TENDON REPAIR: SHX5689

## 2018-08-15 LAB — GLUCOSE, CAPILLARY
Glucose-Capillary: 105 mg/dL — ABNORMAL HIGH (ref 70–99)
Glucose-Capillary: 115 mg/dL — ABNORMAL HIGH (ref 70–99)

## 2018-08-15 SURGERY — SHOULDER ARTHROSCOPY WITH SUBACROMIAL DECOMPRESSION AND BICEP TENDON REPAIR
Anesthesia: Choice | Site: Shoulder | Laterality: Left

## 2018-08-15 MED ORDER — FENTANYL CITRATE (PF) 250 MCG/5ML IJ SOLN
INTRAMUSCULAR | Status: AC
  Filled 2018-08-15: qty 5

## 2018-08-15 MED ORDER — BUPIVACAINE-EPINEPHRINE 0.5% -1:200000 IJ SOLN
INTRAMUSCULAR | Status: DC | PRN
  Administered 2018-08-15: 30 mL

## 2018-08-15 MED ORDER — FENTANYL CITRATE (PF) 100 MCG/2ML IJ SOLN
INTRAMUSCULAR | Status: AC
  Administered 2018-08-15: 50 ug via INTRAVENOUS
  Filled 2018-08-15: qty 2

## 2018-08-15 MED ORDER — LIDOCAINE HCL (PF) 4 % IJ SOLN
INTRAMUSCULAR | Status: DC | PRN
  Administered 2018-08-15: 4 mL via RESPIRATORY_TRACT

## 2018-08-15 MED ORDER — FAMOTIDINE 20 MG PO TABS: 20.0000 mg | ORAL_TABLET | Freq: Once | ORAL | Status: DC

## 2018-08-15 MED ORDER — ROCURONIUM BROMIDE 50 MG/5ML IV SOLN
INTRAVENOUS | Status: AC
  Filled 2018-08-15: qty 2

## 2018-08-15 MED ORDER — LIDOCAINE HCL (PF) 1 % IJ SOLN
INTRAMUSCULAR | Status: AC
  Filled 2018-08-15: qty 5

## 2018-08-15 MED ORDER — PHENYLEPHRINE HCL-NACL 10-0.9 MG/250ML-% IV SOLN
INTRAVENOUS | Status: DC | PRN
  Administered 2018-08-15: 10 ug/min via INTRAVENOUS

## 2018-08-15 MED ORDER — BUPIVACAINE LIPOSOME 1.3 % IJ SUSP
INTRAMUSCULAR | Status: AC
  Filled 2018-08-15: qty 20

## 2018-08-15 MED ORDER — BUPIVACAINE HCL (PF) 0.5 % IJ SOLN
INTRAMUSCULAR | Status: AC
  Filled 2018-08-15: qty 10

## 2018-08-15 MED ORDER — FENTANYL CITRATE (PF) 100 MCG/2ML IJ SOLN
50.0000 ug | Freq: Once | INTRAMUSCULAR | Status: AC
  Administered 2018-08-15: 50 ug via INTRAVENOUS

## 2018-08-15 MED ORDER — MIDAZOLAM HCL 2 MG/2ML IJ SOLN
1.0000 mg | Freq: Once | INTRAMUSCULAR | Status: AC
  Administered 2018-08-15: 1 mg via INTRAVENOUS

## 2018-08-15 MED ORDER — FENTANYL CITRATE (PF) 100 MCG/2ML IJ SOLN
INTRAMUSCULAR | Status: DC | PRN
  Administered 2018-08-15 (×2): 50 ug via INTRAVENOUS

## 2018-08-15 MED ORDER — SODIUM CHLORIDE 0.9 % IV SOLN
INTRAVENOUS | Status: DC
  Administered 2018-08-15: 14:00:00 via INTRAVENOUS

## 2018-08-15 MED ORDER — GLYCOPYRROLATE 0.2 MG/ML IJ SOLN
INTRAMUSCULAR | Status: AC
  Filled 2018-08-15: qty 1

## 2018-08-15 MED ORDER — CEFAZOLIN SODIUM-DEXTROSE 2-4 GM/100ML-% IV SOLN
INTRAVENOUS | Status: AC
  Filled 2018-08-15: qty 100

## 2018-08-15 MED ORDER — LIDOCAINE HCL (CARDIAC) PF 100 MG/5ML IV SOSY
PREFILLED_SYRINGE | INTRAVENOUS | Status: DC | PRN
  Administered 2018-08-15: 100 mg via INTRAVENOUS

## 2018-08-15 MED ORDER — PROPOFOL 10 MG/ML IV BOLUS
INTRAVENOUS | Status: AC
  Filled 2018-08-15: qty 20

## 2018-08-15 MED ORDER — OXYCODONE HCL 5 MG PO TABS: 5.0000 mg | ORAL_TABLET | ORAL | 0 refills | Status: DC | PRN

## 2018-08-15 MED ORDER — PROMETHAZINE HCL 25 MG PO TABS: 25.0000 mg | ORAL_TABLET | Freq: Four times a day (QID) | ORAL | 0 refills | Status: DC | PRN

## 2018-08-15 MED ORDER — LIDOCAINE HCL (PF) 2 % IJ SOLN
INTRAMUSCULAR | Status: AC
  Filled 2018-08-15: qty 20

## 2018-08-15 MED ORDER — LACTATED RINGERS IV SOLN
INTRAVENOUS | Status: DC | PRN
  Administered 2018-08-15: 1 mL

## 2018-08-15 MED ORDER — PROPOFOL 10 MG/ML IV BOLUS
INTRAVENOUS | Status: DC | PRN
  Administered 2018-08-15: 120 mg via INTRAVENOUS

## 2018-08-15 MED ORDER — SUGAMMADEX SODIUM 200 MG/2ML IV SOLN
INTRAVENOUS | Status: DC | PRN
  Administered 2018-08-15: 137 mg via INTRAVENOUS

## 2018-08-15 MED ORDER — GLYCOPYRROLATE 0.2 MG/ML IJ SOLN
INTRAMUSCULAR | Status: DC | PRN
  Administered 2018-08-15: 0.2 mg via INTRAVENOUS

## 2018-08-15 MED ORDER — ROCURONIUM BROMIDE 100 MG/10ML IV SOLN
INTRAVENOUS | Status: DC | PRN
  Administered 2018-08-15: 70 mg via INTRAVENOUS

## 2018-08-15 MED ORDER — FENTANYL CITRATE (PF) 100 MCG/2ML IJ SOLN: 25.0000 ug | Freq: Once | INTRAMUSCULAR | Status: DC

## 2018-08-15 MED ORDER — MIDAZOLAM HCL 2 MG/2ML IJ SOLN
INTRAMUSCULAR | Status: AC
  Administered 2018-08-15: 1 mg via INTRAVENOUS
  Filled 2018-08-15: qty 2

## 2018-08-15 SURGICAL SUPPLY — 48 items
ANCHOR BONE REGENETEN (Anchor) ×3 IMPLANT
ANCHOR JUGGERKNOT WTAP NDL 2.9 (Anchor) ×6 IMPLANT
ANCHOR SUT QUATTRO KNTLS 4.5 (Anchor) ×6 IMPLANT
ANCHOR TENDON REGENETEN (Staple) ×3 IMPLANT
BIT DRILL JUGRKNT W/NDL BIT2.9 (DRILL) ×1 IMPLANT
BLADE FULL RADIUS 3.5 (BLADE) ×3 IMPLANT
BUR ACROMIONIZER 4.0 (BURR) ×3 IMPLANT
CANNULA SHAVER 8MMX76MM (CANNULA) ×3 IMPLANT
CHLORAPREP W/TINT 26ML (MISCELLANEOUS) ×3 IMPLANT
COVER MAYO STAND STRL (DRAPES) ×3 IMPLANT
COVER WAND RF STERILE (DRAPES) ×3 IMPLANT
DRAPE IMP U-DRAPE 54X76 (DRAPES) ×6 IMPLANT
DRILL JUGGERKNOT W/NDL BIT 2.9 (DRILL) ×3
ELECT REM PT RETURN 9FT ADLT (ELECTROSURGICAL) ×3
ELECTRODE REM PT RTRN 9FT ADLT (ELECTROSURGICAL) ×1 IMPLANT
GAUZE PETRO XEROFOAM 1X8 (MISCELLANEOUS) ×3 IMPLANT
GAUZE SPONGE 4X4 12PLY STRL (GAUZE/BANDAGES/DRESSINGS) ×3 IMPLANT
GLOVE BIO SURGEON STRL SZ7.5 (GLOVE) ×6 IMPLANT
GLOVE BIO SURGEON STRL SZ8 (GLOVE) ×6 IMPLANT
GLOVE BIOGEL PI IND STRL 8 (GLOVE) ×1 IMPLANT
GLOVE BIOGEL PI INDICATOR 8 (GLOVE) ×2
GLOVE INDICATOR 8.0 STRL GRN (GLOVE) ×3 IMPLANT
GOWN STRL REUS W/ TWL LRG LVL3 (GOWN DISPOSABLE) ×1 IMPLANT
GOWN STRL REUS W/ TWL XL LVL3 (GOWN DISPOSABLE) ×1 IMPLANT
GOWN STRL REUS W/TWL LRG LVL3 (GOWN DISPOSABLE) ×2
GOWN STRL REUS W/TWL XL LVL3 (GOWN DISPOSABLE) ×2
GRASPER SUT 15 45D LOW PRO (SUTURE) IMPLANT
IMPL REGENETEN MEDIUM (Shoulder) ×1 IMPLANT
IMPLANT REGENETEN MEDIUM (Shoulder) ×3 IMPLANT
IV LACTATED RINGER IRRG 3000ML (IV SOLUTION) ×4
IV LR IRRIG 3000ML ARTHROMATIC (IV SOLUTION) ×2 IMPLANT
MANIFOLD NEPTUNE II (INSTRUMENTS) ×3 IMPLANT
MASK FACE SPIDER DISP (MASK) ×3 IMPLANT
MAT ABSORB  FLUID 56X50 GRAY (MISCELLANEOUS) ×2
MAT ABSORB FLUID 56X50 GRAY (MISCELLANEOUS) ×1 IMPLANT
PACK ARTHROSCOPY SHOULDER (MISCELLANEOUS) ×3 IMPLANT
SLING ARM LRG DEEP (SOFTGOODS) IMPLANT
SLING ULTRA II LG (MISCELLANEOUS) ×3 IMPLANT
STAPLER SKIN PROX 35W (STAPLE) ×3 IMPLANT
STRAP SAFETY 5IN WIDE (MISCELLANEOUS) ×3 IMPLANT
SUT ETHIBOND 0 MO6 C/R (SUTURE) ×3 IMPLANT
SUT VIC AB 2-0 CT1 27 (SUTURE) ×4
SUT VIC AB 2-0 CT1 TAPERPNT 27 (SUTURE) ×2 IMPLANT
TAPE MICROFOAM 4IN (TAPE) ×3 IMPLANT
TUBING ARTHRO INFLOW-ONLY STRL (TUBING) ×3 IMPLANT
TUBING CONNECTING 10 (TUBING) ×2 IMPLANT
TUBING CONNECTING 10' (TUBING) ×1
WAND WEREWOLF FLOW 90D (MISCELLANEOUS) ×3 IMPLANT

## 2018-08-15 NOTE — Transfer of Care (Signed)
Immediate Anesthesia Transfer of Care Note  Patient: Patricia Lucas  Procedure(s) Performed: SHOULDER ARTHROSCOPY WITH DEBRIDEMENT, DECOMPRESSION, ROTATOR CUFF REPAIR AND BICEP TENODESIS-LEFT (Left Shoulder)  Patient Location: PACU  Anesthesia Type:General  Level of Consciousness: awake, alert  and oriented  Airway & Oxygen Therapy: Patient Spontanous Breathing  Post-op Assessment: Report given to RN and Post -op Vital signs reviewed and stable  Post vital signs: Reviewed and stable  Last Vitals:  Vitals Value Taken Time  BP    Temp    Pulse 83 08/15/2018  3:12 PM  Resp 14 08/15/2018  3:12 PM  SpO2 84 % 08/15/2018  3:12 PM  Vitals shown include unvalidated device data.  Last Pain:  Vitals:   08/15/18 1222  TempSrc:   PainSc: 0-No pain         Complications: No apparent anesthesia complications

## 2018-08-15 NOTE — Anesthesia Preprocedure Evaluation (Signed)
Anesthesia Evaluation  Patient identified by MRN, date of birth, ID band Patient awake    Reviewed: Allergy & Precautions, H&P , NPO status , Patient's Chart, lab work & pertinent test results, reviewed documented beta blocker date and time   History of Anesthesia Complications (+) history of anesthetic complications  Airway Mallampati: II  TM Distance: >3 FB Neck ROM: full    Dental  (+) Teeth Intact   Pulmonary neg pulmonary ROS, former smoker,    Pulmonary exam normal        Cardiovascular Exercise Tolerance: Good negative cardio ROS Normal cardiovascular exam Rhythm:regular Rate:Normal     Neuro/Psych negative neurological ROS  negative psych ROS   GI/Hepatic negative GI ROS, Neg liver ROS, GERD  ,  Endo/Other  negative endocrine ROSdiabetes  Renal/GU Renal diseasenegative Renal ROS  negative genitourinary   Musculoskeletal   Abdominal   Peds  Hematology negative hematology ROS (+)   Anesthesia Other Findings Past Medical History: No date: Chronic cystitis No date: Chronic kidney disease     Comment:  enhanced nsaid use (for psoriatic arthritis) caused               renal disease. 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 No date: GERD (gastroesophageal reflux disease) No date: Osteoporosis No date: Psoriasis No date: Psoriatic arthritis (HCC) No date: UTI (lower urinary tract infection) Past Surgical History: 1969: ABDOMINAL HYSTERECTOMY No date: APPENDECTOMY 2010: BLADDER SUSPENSION No date: CATARACT EXTRACTION, BILATERAL No date: fibula fract 2015: OVARIAN CYST REMOVAL   Reproductive/Obstetrics negative OB ROS                             Anesthesia Physical Anesthesia Plan  ASA: III  Anesthesia Plan: General ETT    Post-op Pain Management:  Regional for Post-op pain   Induction:   PONV Risk Score and Plan:   Airway Management Planned:   Additional Equipment:   Intra-op Plan:   Post-operative Plan:   Informed Consent: I have reviewed the patients History and Physical, chart, labs and discussed the procedure including the risks, benefits and alternatives for the proposed anesthesia with the patient or authorized representative who has indicated his/her understanding and acceptance.     Dental Advisory Given  Plan Discussed with: CRNA  Anesthesia Plan Comments:         Anesthesia Quick Evaluation

## 2018-08-15 NOTE — Op Note (Signed)
08/15/2018  3:00 PM  Patient:   Patricia Lucas  Pre-Op Diagnosis:   Nontraumatic full-thickness rotator cuff tear, left shoulder.  Post-Op Diagnosis:   Nontraumatic full-thickness rotator cuff tear with degenerative labral fraying, left shoulder.  Procedure:   Limited arthroscopic debridement, arthroscopic subacromial decompression, and mini-open rotator cuff repair using a Smith [employer] Nephew Regeneten patch, left shoulder.  Anesthesia:   General endotracheal with interscalene block using Exparel placed preoperatively by the anesthesiologist.  Surgeon:   Maryagnes Amos, MD  Assistant:   Horris Latino, PA-C  Findings:   As above.  There was a full-thickness tear involving the supraspinatus tendon measuring approximately 1.5 cm from anterior to posterior and 2.5 cm from medial to lateral.  In addition, there appeared to be an old tear involving the insertional fibers of the infraspinatus tendon.  The biceps had ruptured from its labral attachment and retracted chronically.  There was moderate fraying of the labrum anteriorly and superiorly without frank detachment from the glenoid.  The articular surfaces of the glenoid and humerus both were in satisfactory condition.  Complications:   None  Fluids:   700 cc  Estimated blood loss:   5 cc  Tourniquet time:   None  Drains:   None  Closure:   Staples      Brief clinical note:   The patient is a 78 year old female with a several year history of left shoulder pain which worsened several months ago. The patient's symptoms have progressed despite medications, activity modification, etc. The patient's history and examination are consistent with impingement/tendinopathy with a rotator cuff tear. These findings were confirmed by MRI scan. The patient presents at this time for definitive management of these shoulder symptoms.  Procedure:   The patient underwent placement of an interscalene block using Exparel by the anesthesiologist in the  preoperative holding area before being brought into the operating room and lain in the supine position. The patient then underwent general endotracheal intubation and anesthesia before being repositioned in the beach chair position using the beach chair positioner. The left shoulder and upper extremity were prepped with ChloraPrep solution before being draped sterilely. Preoperative antibiotics were administered. A timeout was performed to confirm the proper surgical site before the expected portal sites and incision site were injected with 0.5% Sensorcaine with epinephrine. A posterior portal was created and the glenohumeral joint thoroughly inspected with the findings as described above. An anterior portal was created using an outside-in technique. The labrum and rotator cuff were further probed, again confirming the above-noted findings. The areas of labral fraying, areas of synovitis, and the torn margins of the rotator cuff all were debrided back to stable margins using the full-radius resector. The ArthroCare wand was inserted and used to obtain hemostasis as well as to "anneal" the labrum superiorly and anteriorly. The instruments were removed from the joint after suctioning the excess fluid.  The camera was repositioned through the posterior portal into the subacromial space. A separate lateral portal was created using an outside-in technique. The 3.5 mm full-radius resector was introduced and used to perform a subtotal bursectomy. The ArthroCare wand was then inserted and used to remove the periosteal tissue off the undersurface of the anterior third of the acromion as well as to recess the coracoacromial ligament from its attachment along the anterior and lateral margins of the acromion. The 4.0 mm acromionizing bur was introduced and used to complete the decompression by removing the undersurface of the anterior third of the acromion. The  full radius resector was reintroduced to remove any residual bony  debris before the ArthroCare wand was reintroduced to obtain hemostasis. The instruments were then removed from the subacromial space after suctioning the excess fluid.  An approximately 4-5 cm incision was made over the anterolateral aspect of the shoulder beginning at the anterolateral corner of the acromion and extending distally in line with the bicipital groove. This incision was carried down through the subcutaneous tissues to expose the deltoid fascia. The raphae between the anterior and middle thirds was identified and this plane developed to provide access into the subacromial space. Additional bursal tissues were debrided sharply using Metzenbaum scissors. The rotator cuff tear was readily identified. The margins were debrided sharply with a #15 blade and the exposed greater tuberosity roughened with a rongeur. The tear was repaired using several #0 Ethibond interrupted sutures placed in a side-to-side fashion to repair the longitudinal component of the tear. Laterally, the tear was reapproximated using two Biomet 2.9 mm JuggerKnot anchors. These sutures were then brought back laterally and secured using two [place] QuatroLink anchors to create a two-layer closure. Given the patient's poor tissue quality, it was elected to apply a Smith & Nephew Regeneten patch over the construct to try to stimulate a more robust healing response. The patch was secured using the appropriate bone and soft tissue staples. An apparent watertight closure was obtained.  The bicipital groove was identified by palpation and opened for 1-1.5 cm. The biceps tendon stump was retrieved through this defect. The floor of the bicipital groove was roughened with a curet before another Biomet 2.9 mm JuggerKnot anchor was inserted. Both sets of sutures were passed through the biceps tendon and tied securely to effect the tenodesis. The bicipital sheath was reapproximated using two #0 Ethibond interrupted sutures, incorporating the  biceps tendon to further reinforce the tenodesis.  The wound was copiously irrigated with sterile saline solution before the deltoid raphae was reapproximated using 2-0 Vicryl interrupted sutures. The subcutaneous tissues were closed in two layers using 2-0 Vicryl interrupted sutures before the skin was closed using staples. The portal sites also were closed using staples. A sterile bulky dressing was applied to the shoulder before the arm was placed into a shoulder immobilizer. The patient was then awakened, extubated, and returned to the recovery room in satisfactory condition after tolerating the procedure well.

## 2018-08-15 NOTE — Anesthesia Post-op Follow-up Note (Signed)
Anesthesia QCDR form completed.        

## 2018-08-15 NOTE — Anesthesia Postprocedure Evaluation (Signed)
Anesthesia Post Note  Patient: Durwin Norarlene F Hubers  Procedure(s) Performed: SHOULDER ARTHROSCOPY WITH DEBRIDEMENT, DECOMPRESSION, ROTATOR CUFF REPAIR AND BICEP TENODESIS-LEFT (Left Shoulder)  Patient location during evaluation: PACU Anesthesia Type: General Level of consciousness: awake and alert Pain management: pain level controlled Vital Signs Assessment: post-procedure vital signs reviewed and stable Respiratory status: spontaneous breathing, nonlabored ventilation, respiratory function stable and patient connected to nasal cannula oxygen Cardiovascular status: blood pressure returned to baseline and stable Postop Assessment: no apparent nausea or vomiting Anesthetic complications: no     Last Vitals:  Vitals:   08/15/18 1559 08/15/18 1638  BP: (!) 160/81 (!) 157/82  Pulse: 91 90  Resp: 16 16  Temp: (!) 36.1 C   SpO2: 96% 95%    Last Pain:  Vitals:   08/15/18 1638  TempSrc:   PainSc: 0-No pain                 Eber Ferrufino S

## 2018-08-15 NOTE — Anesthesia Procedure Notes (Signed)
Anesthesia Regional Block: Interscalene brachial plexus block   Pre-Anesthetic Checklist: ,, timeout performed, Correct Patient, Correct Site, Correct Laterality, Correct Procedure, Correct Position, site marked, Risks and benefits discussed,  Surgical consent,  Pre-op evaluation,  At surgeon's request and post-op pain management  Laterality: Left  Prep: chloraprep       Needles:  Injection technique: Single-shot  Needle Type: Stimiplex     Needle Length: 10cm  Needle Gauge: 20     Additional Needles:   Procedures:, nerve stimulator,,, ultrasound used (permanent image in chart),,,,   Nerve Stimulator or Paresthesia:  Response: biceps flexion,   Additional Responses:   Narrative:  Injection made incrementally with aspirations every 5 mL.  Performed by: Personally  Anesthesiologist: Yevette Edwards, MD  Additional Notes: Functioning IV was confirmed and monitors were applied.   Sterile prep and drape,hand hygiene and sterile gloves were used.  Negative aspiration and negative test dose prior to incremental administration of local anesthetic. The patient tolerated the procedure well.

## 2018-08-15 NOTE — Discharge Instructions (Addendum)
AMBULATORY SURGERY  DISCHARGE INSTRUCTIONS   1) The drugs that you were given will stay in your system until tomorrow so for the next 24 hours you should not:  A) Drive an automobile B) Make any legal decisions C) Drink any alcoholic beverage   2) You may resume regular meals tomorrow.  Today it is better to start with liquids and gradually work up to solid foods.  You may eat anything you prefer, but it is better to start with liquids, then soup and crackers, and gradually work up to solid foods.   3) Please notify your doctor immediately if you have any unusual bleeding, trouble breathing, redness and pain at the surgery site, drainage, fever, or pain not relieved by medication. 4)   5) Your post-operative visit with Dr.                                     is: Date:                        Time:    Please call to schedule your post-operative visit.  6) Additional Instructions:     Orthopedic discharge instructions: Keep dressing dry and intact.  May shower after dressing changed on post-op day #4 (Monday).  Cover staples with Band-Aids after drying off. Apply ice frequently to shoulder. Take oxycodone as prescribed when needed.  May supplement with ES Tylenol if necessary. Keep shoulder immobilizer on at all times except may remove for bathing purposes. Follow-up in 10-14 days or as scheduled.

## 2018-08-15 NOTE — Anesthesia Procedure Notes (Signed)
Procedure Name: Intubation Date/Time: 08/15/2018 1:42 PM Performed by: Bernardo Heater, CRNA Pre-anesthesia Checklist: Patient identified, Emergency Drugs available, Suction available and Patient being monitored Patient Re-evaluated:Patient Re-evaluated prior to induction Oxygen Delivery Method: Circle system utilized Preoxygenation: Pre-oxygenation with 100% oxygen Induction Type: IV induction Laryngoscope Size: Mac and 3 Grade View: Grade I Tube size: 7.0 mm Number of attempts: 1 Airway Equipment and Method: LTA kit utilized Placement Confirmation: ETT inserted through vocal cords under direct vision,  positive ETCO2 and breath sounds checked- equal and bilateral Secured at: 20 cm Tube secured with: Tape Dental Injury: Teeth and Oropharynx as per pre-operative assessment

## 2018-08-15 NOTE — H&P (Signed)
Paper H&P to be scanned into permanent record. H&P reviewed and patient re-examined. No changes. 

## 2018-08-16 ENCOUNTER — Encounter: Payer: Self-pay | Admitting: Surgery

## 2018-08-21 NOTE — Addendum Note (Signed)
Addendum  created 08/21/18 1407 by Stormy Fabian, CRNA   Charge Capture section accepted

## 2019-01-22 ENCOUNTER — Other Ambulatory Visit: Payer: Self-pay | Admitting: Family Medicine

## 2019-01-22 DIAGNOSIS — Z1231 Encounter for screening mammogram for malignant neoplasm of breast: Secondary | ICD-10-CM

## 2019-01-23 ENCOUNTER — Other Ambulatory Visit: Payer: Self-pay

## 2019-01-23 ENCOUNTER — Ambulatory Visit
Admission: RE | Admit: 2019-01-23 | Discharge: 2019-01-23 | Disposition: A | Discharge status: Home / Self Care | Payer: Medicare Other | Source: Ambulatory Visit | Attending: Family Medicine | Admitting: Family Medicine

## 2019-01-23 DIAGNOSIS — Z1231 Encounter for screening mammogram for malignant neoplasm of breast: Secondary | ICD-10-CM | POA: Diagnosis present

## 2020-01-16 DIAGNOSIS — E7800 Pure hypercholesterolemia, unspecified: Secondary | ICD-10-CM | POA: Insufficient documentation

## 2020-02-18 ENCOUNTER — Other Ambulatory Visit: Payer: Self-pay | Admitting: Family Medicine

## 2020-02-18 DIAGNOSIS — Z1231 Encounter for screening mammogram for malignant neoplasm of breast: Secondary | ICD-10-CM

## 2020-03-22 ENCOUNTER — Other Ambulatory Visit: Payer: Self-pay

## 2020-03-22 ENCOUNTER — Ambulatory Visit
Admission: RE | Admit: 2020-03-22 | Discharge: 2020-03-22 | Disposition: A | Discharge status: Home / Self Care | Payer: Medicare Other | Source: Ambulatory Visit | Attending: Family Medicine | Admitting: Family Medicine

## 2020-03-22 DIAGNOSIS — Z1231 Encounter for screening mammogram for malignant neoplasm of breast: Secondary | ICD-10-CM | POA: Diagnosis not present

## 2020-09-27 DIAGNOSIS — I10 Essential (primary) hypertension: Secondary | ICD-10-CM | POA: Insufficient documentation

## 2021-02-21 ENCOUNTER — Other Ambulatory Visit: Payer: Self-pay | Admitting: Family Medicine

## 2021-02-21 DIAGNOSIS — Z1231 Encounter for screening mammogram for malignant neoplasm of breast: Secondary | ICD-10-CM

## 2021-03-23 ENCOUNTER — Other Ambulatory Visit: Payer: Self-pay

## 2021-03-23 ENCOUNTER — Ambulatory Visit
Admission: RE | Admit: 2021-03-23 | Discharge: 2021-03-23 | Disposition: A | Discharge status: Home / Self Care | Payer: Medicare Other | Source: Ambulatory Visit | Attending: Family Medicine | Admitting: Family Medicine

## 2021-03-23 DIAGNOSIS — Z1231 Encounter for screening mammogram for malignant neoplasm of breast: Secondary | ICD-10-CM | POA: Insufficient documentation

## 2021-04-12 ENCOUNTER — Ambulatory Visit: Payer: Self-pay | Admitting: Urology

## 2021-04-14 NOTE — Progress Notes (Signed)
04/15/2021 1:30 PM   Patricia Lucas Nov 23, 1940 [id number]  Referring provider: Dorothey Baseman, MD [phone] [place],  [place] [address]  Chief Complaint  Patient presents with   New Patient (Initial Visit)    Chronic Cystitis    HPI: 80 year-old female referred for further evaluation of recurrent urinary tract infections.  She is a personal history of "chronic cystitis".  She is present straight abdominal hysterectomy as well as a bladder suspension in 2010.  She has been followed by Jeannine Kitten at [facility] 06/2020.  She is on suppressive daily Macrobid, estrogen cream twice per week, cranberry tablets twice per day.  With past year, seen no evidence of any documented urinary tract infections.  She had a negative urinalysis including" microscopic exam on 03/30/2021.  Despite this however, urine culture was sent and she had grown out E. coli.  This is intermediately sensitive to Macrobid but otherwise pansensitive.  She reports today that when she gave this urine stable, she was having dysuria and UTI type symptoms.  She thinks that there must be a mixup with her urinalysis because the dip was negative but the culture grew E. coli.  She reports that she was told to come back and give more urine which is where she think the mixup was.  She says that she was only prescribed 5 days of antibiotic which in the past has not been sufficient to clear her infections.  She believes that she needs 10 days at minimum.  Today, she has no urinary symptoms.  She denies any leakage.  No dysuria or gross hematuria.  Her urinalysis is somewhat suspicious today and she is worried about her UTI coming back.  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   PMH: Past Medical History:  Diagnosis Date   Chronic cystitis    Chronic kidney disease    enhanced nsaid use (for psoriatic arthritis) caused renal disease.   Colon polyp    Complication of  anesthesia 2017   vomitting with waking up after colonoscopy   Diabetes mellitus without complication (HCC)    Generalized OA    GERD (gastroesophageal reflux disease)    Osteoporosis    Psoriasis    Psoriatic arthritis (HCC)    UTI (lower urinary tract infection)     Surgical History: Past Surgical History:  Procedure Laterality Date   ABDOMINAL HYSTERECTOMY  1969   APPENDECTOMY     BLADDER SUSPENSION  2010   CATARACT EXTRACTION, BILATERAL     fibula fract     OVARIAN CYST REMOVAL  2015   SHOULDER ARTHROSCOPY WITH SUBACROMIAL DECOMPRESSION AND BICEP TENDON REPAIR Left 08/15/2018   Procedure: SHOULDER ARTHROSCOPY WITH DEBRIDEMENT, DECOMPRESSION, ROTATOR CUFF REPAIR AND BICEP TENODESIS-LEFT;  Surgeon: Christena Flake, MD;  Location: [facility] ORS;  Service: Orthopedics;  Laterality: Left;    Home Medications:  Allergies as of 04/15/2021       Reactions   Nsaids    NOT TO TAKE DUE TO RENAL DISEASE        Medication List        Accurate as of April 15, 2021  1:30 PM. If you have any questions, ask your nurse or doctor.          STOP taking these medications    cyclobenzaprine 10 MG tablet Commonly known as: FLEXERIL Stopped by: Vanna Scotland, MD   oxyCODONE 5 MG immediate release tablet Commonly known as:  Roxicodone Stopped by: Vanna Scotland, MD   promethazine 25 MG tablet Commonly known as: PHENERGAN Stopped by: Vanna Scotland, MD   traMADol 50 MG tablet Commonly known as: ULTRAM Stopped by: Vanna Scotland, MD       TAKE these medications    atorvastatin 20 MG tablet Commonly known as: LIPITOR Take 20 mg by mouth daily.   BIOFREEZE EX Apply 1 application topically daily as needed (joint pain).   clobetasol cream 0.05 % Commonly known as: TEMOVATE Apply 1 application topically daily as needed (inflammation).   Cranberry 450 MG Tabs Take 450 mg by mouth daily.   estradiol 0.1 MG/GM vaginal cream Commonly known as: ESTRACE Place 1  Applicatorful vaginally 2 (two) times a week.   gabapentin 100 MG capsule Commonly known as: NEURONTIN Take by mouth.   HAIR/SKIN/NAILS PO Take 1 tablet by mouth daily.   inFLIXimab 100 MG injection Commonly known as: REMICADE Inject 400 mg into the muscle every 8 (eight) weeks. Next dose due 08/20/2018   lisinopril 5 MG tablet Commonly known as: ZESTRIL Take 5 mg by mouth daily.   lisinopril 10 MG tablet Commonly known as: ZESTRIL Take 10 mg by mouth daily.   MAGNESIUM OXIDE PO Take 500 mg by mouth daily.   metFORMIN 500 MG tablet Commonly known as: GLUCOPHAGE Take 500 mg by mouth 2 (two) times daily.   metoprolol succinate 50 MG 24 hr tablet Commonly known as: TOPROL-XL Take 50 mg by mouth 2 (two) times daily.   nitrofurantoin (macrocrystal-monohydrate) 100 MG capsule Commonly known as: MACROBID Take 100 mg by mouth at bedtime.   OMEGA 3 PO Take 1 capsule by mouth 2 (two) times daily.   sulfamethoxazole-trimethoprim 800-160 MG tablet Commonly known as: BACTRIM DS Take 1 tablet by mouth every 12 (twelve) hours. Started by: Vanna Scotland, MD   SYSTANE OP Place 1 drop into both eyes every morning.   tiZANidine 4 MG tablet Commonly known as: ZANAFLEX Take 2 mg by mouth every 8 (eight) hours as needed.   traZODone 50 MG tablet Commonly known as: DESYREL Take 50 mg by mouth at bedtime as needed for sleep.   Vitamin D3 25 MCG (1000 UT) Caps Take 1,000 Units by mouth at bedtime.        Allergies:  Allergies  Allergen Reactions   Nsaids     NOT TO TAKE DUE TO RENAL DISEASE    Family History: Family History  Problem Relation Age of Onset   Breast cancer Mother 79   Breast cancer Sister    Breast cancer Maternal Aunt 60   Bladder Cancer Neg Hx    Kidney cancer Neg Hx    Prostate cancer Neg Hx     Social History:  reports that she quit smoking about 23 years ago. Her smoking use included cigarettes. She smoked an average of 1 pack per day. She has  never used smokeless tobacco. She reports that she does not drink alcohol and does not use drugs.   Physical Exam: BP (!) 154/79   Pulse 76   Ht 5\' 2"  (1.575 m)   Wt 143 lb (64.9 kg)   BMI 26.16 kg/m   Constitutional:  Alert and oriented, No acute distress. HEENT: [place] AT, moist mucus membranes.  Trachea midline, no masses. Cardiovascular: No clubbing, cyanosis, or edema. Respiratory: Normal respiratory effort, no increased work of breathing. GI: Abdomen is soft, nontender, nondistended, no abdominal masses GU: No CVA tenderness Skin: No rashes, bruises or suspicious lesions. Neurologic:  Grossly intact, no focal deficits, moving all 4 extremities. Psychiatric: Normal mood and affect.  Laboratory Data: Lab Results  Component Value Date   WBC 5.5 08/08/2018   HGB 10.8 (L) 08/08/2018   HCT 34.2 (L) 08/08/2018   MCV 93.7 08/08/2018   PLT 236 08/08/2018    Lab Results  Component Value Date   CREATININE 0.83 08/08/2018    Lab Results  Component Value Date   HGBA1C 6.3 (H) 02/21/2015    Urinalysis    Component Value Date/Time   COLORURINE YELLOW 04/15/2021 1002   APPEARANCEUR CLOUDY (A) 04/15/2021 1002   LABSPEC 1.020 04/15/2021 1002   PHURINE 5.5 04/15/2021 1002   GLUCOSEU NEGATIVE 04/15/2021 1002   HGBUR NEGATIVE 04/15/2021 1002   BILIRUBINUR NEGATIVE 04/15/2021 1002   KETONESUR NEGATIVE 04/15/2021 1002   PROTEINUR NEGATIVE 04/15/2021 1002   NITRITE NEGATIVE 04/15/2021 1002   LEUKOCYTESUR TRACE (A) 04/15/2021 1002    Lab Results  Component Value Date   BACTERIA MANY (A) 04/15/2021    Pertinent Imaging: Results for orders placed or performed in visit on 04/15/21  BLADDER SCAN AMB NON-IMAGING  Result Value Ref Range   Scan Result 55ml      Assessment & Plan:    1. Chronic cystitis We had a lengthy discussion today about the risk and benefits of continued daily prophylaxis, expressed my concern about intermediate sensitivity to Macrobid  Ultimately, we  elected to continue this along with her other interventions including estrogen cream and cranberry tablets twice daily  She will start coming here as needed when she has UTI type symptoms - BLADDER SCAN AMB NON-IMAGING - Urine Culture; Future  2. Acute cystitis without hematuria Urinalysis is suspicious today and given her history of needing a longer course of antibiotics, agree with going ahead and treating this  Bactrim DS twice daily x7 days prescribed, will repeat urine culture   Return in about 1 year (around 04/15/2022).  Vanna Scotland, MD  [facility] [address] [place], [place] [address] [phone]

## 2021-04-15 ENCOUNTER — Ambulatory Visit (INDEPENDENT_AMBULATORY_CARE_PROVIDER_SITE_OTHER): Payer: Medicare Other | Admitting: Urology

## 2021-04-15 ENCOUNTER — Other Ambulatory Visit: Payer: Self-pay | Admitting: *Deleted

## 2021-04-15 ENCOUNTER — Encounter: Payer: Self-pay | Admitting: Urology

## 2021-04-15 ENCOUNTER — Other Ambulatory Visit: Payer: Self-pay

## 2021-04-15 ENCOUNTER — Other Ambulatory Visit
Admission: RE | Admit: 2021-04-15 | Discharge: 2021-04-15 | Disposition: A | Discharge status: Home / Self Care | Payer: Medicare Other | Attending: Urology | Admitting: Urology

## 2021-04-15 VITALS — BP 154/79 | HR 76 | Ht 62.0 in | Wt 143.0 lb

## 2021-04-15 DIAGNOSIS — N3000 Acute cystitis without hematuria: Secondary | ICD-10-CM | POA: Diagnosis present

## 2021-04-15 DIAGNOSIS — N3020 Other chronic cystitis without hematuria: Secondary | ICD-10-CM | POA: Insufficient documentation

## 2021-04-15 LAB — BLADDER SCAN AMB NON-IMAGING

## 2021-04-15 LAB — URINALYSIS, COMPLETE (UACMP) WITH MICROSCOPIC
Bilirubin Urine: NEGATIVE
Glucose, UA: NEGATIVE mg/dL
Hgb urine dipstick: NEGATIVE
Ketones, ur: NEGATIVE mg/dL
Nitrite: NEGATIVE
Protein, ur: NEGATIVE mg/dL
Specific Gravity, Urine: 1.02 (ref 1.005–1.030)
pH: 5.5 (ref 5.0–8.0)

## 2021-04-15 MED ORDER — SULFAMETHOXAZOLE-TRIMETHOPRIM 800-160 MG PO TABS: 1.0000 | ORAL_TABLET | Freq: Two times a day (BID) | ORAL | 0 refills | Status: DC

## 2021-04-17 LAB — URINE CULTURE: Culture: NO GROWTH

## 2021-05-23 ENCOUNTER — Other Ambulatory Visit: Payer: Self-pay

## 2021-05-23 ENCOUNTER — Ambulatory Visit (INDEPENDENT_AMBULATORY_CARE_PROVIDER_SITE_OTHER): Payer: Medicare Other | Admitting: Physician Assistant

## 2021-05-23 VITALS — BP 159/88 | HR 74 | Temp 98.2°F | Ht 62.0 in | Wt 143.0 lb

## 2021-05-23 DIAGNOSIS — R300 Dysuria: Secondary | ICD-10-CM | POA: Diagnosis not present

## 2021-05-23 LAB — MICROSCOPIC EXAMINATION
Epithelial Cells (non renal): >10 /hpf — AB (ref 0–10)
WBC, UA: >30 /hpf — AB (ref 0–5)

## 2021-05-23 LAB — URINALYSIS, COMPLETE
Bilirubin, UA: NEGATIVE
Glucose, UA: NEGATIVE
Ketones, UA: NEGATIVE
Nitrite, UA: NEGATIVE
RBC, UA: NEGATIVE
Specific Gravity, UA: 1.01 (ref 1.005–1.030)
Urobilinogen, Ur: 0.2 mg/dL (ref 0.2–1.0)
pH, UA: 5.5 (ref 5.0–7.5)

## 2021-05-23 MED ORDER — CEFUROXIME AXETIL 250 MG PO TABS
250.0000 mg | ORAL_TABLET | Freq: Two times a day (BID) | ORAL | 0 refills | Status: AC
Start: 2021-05-23 — End: 2021-05-30

## 2021-05-23 NOTE — Progress Notes (Signed)
In and Out Catheterization  Patient is present today for a I & O catheterization due to dysuria. Patient was cleaned and prepped in a sterile fashion with betadine . A 14FR cath was inserted no complications were noted , 19ml of urine return was noted, urine was light yellow and hazy in color. A clean urine sample was collected for urinalysis. Bladder was drained  and catheter was removed with out difficulty.    Performed by: Franchot Erichsen CMA

## 2021-05-23 NOTE — Progress Notes (Signed)
05/23/2021 1:06 PM   [name] Jul 11, 1940 [id number]  CC: Chief Complaint  Patient presents with   Dysuria    HPI: Patricia Lucas is a 80 y.o. female with PMH chronic cystitis on daily Macrobid, cranberry supplements, and estrogen cream twice weekly and possible recurrent UTIs who presents today for evaluation of possible UTI.   Today she reports a 1 week history of progressively worsening dysuria, urgency, and frequency.  She states the dysuria started as terminal dysuria but now occurs throughout the duration of urination.  She denies fever, chills, nausea, vomiting, flank pain, and gross hematuria.  She has not been taking any medications at home.  She continues to take daily suppressive Macrobid.  She thinks she has an infection today but she was only treated with Bactrim DS twice daily x7 days by Dr. Magen Quan last month for possible UTI, though her urine culture at that time came back with no growth.  In-office catheterized UA today positive for trace protein and 1+ leukocyte esterase; urine microscopy with >30 WBCs/HPF and >10 epithelial cells/hpf.  PMH: Past Medical History:  Diagnosis Date   Chronic cystitis    Chronic kidney disease    enhanced nsaid use (for psoriatic arthritis) caused renal disease.   Colon polyp    Complication of anesthesia 2017   vomitting with waking up after colonoscopy   Diabetes mellitus without complication (HCC)    Generalized OA    GERD (gastroesophageal reflux disease)    Osteoporosis    Psoriasis    Psoriatic arthritis ([place])    UTI (lower urinary tract infection)     Surgical History: Past Surgical History:  Procedure Laterality Date   ABDOMINAL HYSTERECTOMY  1969   APPENDECTOMY     BLADDER SUSPENSION  2010   CATARACT EXTRACTION, BILATERAL     fibula fract     OVARIAN CYST REMOVAL  2015   SHOULDER ARTHROSCOPY WITH SUBACROMIAL DECOMPRESSION AND BICEP TENDON REPAIR Left 08/15/2018   Procedure: SHOULDER ARTHROSCOPY WITH  DEBRIDEMENT, DECOMPRESSION, ROTATOR CUFF REPAIR AND BICEP TENODESIS-LEFT;  Surgeon: Corky Mull, MD;  Location: [facility] ORS;  Service: Orthopedics;  Laterality: Left;    Home Medications:  Allergies as of 05/23/2021       Reactions   Nsaids    NOT TO TAKE DUE TO RENAL DISEASE        Medication List        Accurate as of May 23, 2021  1:06 PM. If you have any questions, ask your nurse or doctor.          STOP taking these medications    sulfamethoxazole-trimethoprim 800-160 MG tablet Commonly known as: BACTRIM DS Stopped by: Debroah Loop, PA-C       TAKE these medications    atorvastatin 20 MG tablet Commonly known as: LIPITOR Take 20 mg by mouth daily.   BIOFREEZE EX Apply 1 application topically daily as needed (joint pain).   cefUROXime 250 MG tablet Commonly known as: CEFTIN Take 1 tablet (250 mg total) by mouth 2 (two) times daily with a meal for 7 days. Started by: Debroah Loop, PA-C   clobetasol cream 0.05 % Commonly known as: TEMOVATE Apply 1 application topically daily as needed (inflammation).   Cranberry 450 MG Tabs Take 450 mg by mouth daily.   estradiol 0.1 MG/GM vaginal cream Commonly known as: ESTRACE Place 1 Applicatorful vaginally 2 (two) times a week.   gabapentin 100 MG capsule Commonly known as: NEURONTIN Take by mouth.  HAIR/SKIN/NAILS PO Take 1 tablet by mouth daily.   inFLIXimab 100 MG injection Commonly known as: REMICADE Inject 400 mg into the muscle every 8 (eight) weeks. Next dose due 08/20/2018   lisinopril 5 MG tablet Commonly known as: ZESTRIL Take 5 mg by mouth daily.   lisinopril 10 MG tablet Commonly known as: ZESTRIL Take 10 mg by mouth daily.   MAGNESIUM OXIDE PO Take 500 mg by mouth daily.   metFORMIN 500 MG tablet Commonly known as: GLUCOPHAGE Take 500 mg by mouth 2 (two) times daily.   metoprolol succinate 50 MG 24 hr tablet Commonly known as: TOPROL-XL Take 50 mg by mouth 2  (two) times daily.   nitrofurantoin (macrocrystal-monohydrate) 100 MG capsule Commonly known as: MACROBID Take 100 mg by mouth at bedtime.   OMEGA 3 PO Take 1 capsule by mouth 2 (two) times daily.   SYSTANE OP Place 1 drop into both eyes every morning.   tiZANidine 4 MG tablet Commonly known as: ZANAFLEX Take 2 mg by mouth every 8 (eight) hours as needed.   traZODone 50 MG tablet Commonly known as: DESYREL Take 50 mg by mouth at bedtime as needed for sleep.   Vitamin D3 25 MCG (1000 UT) Caps Take 1,000 Units by mouth at bedtime.        Allergies:  Allergies  Allergen Reactions   Nsaids     NOT TO TAKE DUE TO RENAL DISEASE    Family History: Family History  Problem Relation Age of Onset   Breast cancer Mother 38   Breast cancer Sister    Breast cancer Maternal Aunt 60   Bladder Cancer Neg Hx    Kidney cancer Neg Hx    Prostate cancer Neg Hx     Social History:   reports that she quit smoking about 23 years ago. Her smoking use included cigarettes. She smoked an average of 1 pack per day. She has never used smokeless tobacco. She reports that she does not drink alcohol and does not use drugs.  Physical Exam: BP (!) 159/88   Pulse 74   Temp 98.2 F (36.8 C) (Oral)   Ht 5\' 2"  (1.575 m)   Wt 143 lb (64.9 kg)   BMI 26.16 kg/m   Constitutional:  Alert and oriented, no acute distress, nontoxic appearing HEENT: [place], AT Cardiovascular: No clubbing, cyanosis, or edema Respiratory: Normal respiratory effort, no increased work of breathing Skin: No rashes, bruises or suspicious lesions Neurologic: Grossly intact, no focal deficits, moving all 4 extremities Psychiatric: Normal mood and affect  Laboratory Data: Results for orders placed or performed in visit on 05/23/21  Microscopic Examination   Urine  Result Value Ref Range   WBC, UA >30 (A) 0 - 5 /hpf   RBC 0-2 0 - 2 /hpf   Epithelial Cells (non renal) >10 (A) 0 - 10 /hpf   Bacteria, UA Few (A) None  seen/Few  Urinalysis, Complete  Result Value Ref Range   Specific Gravity, UA 1.010 1.005 - 1.030   pH, UA 5.5 5.0 - 7.5   Color, UA Yellow Yellow   Appearance Ur Hazy (A) Clear   Leukocytes,UA 1+ (A) Negative   Protein,UA Trace (A) Negative/Trace   Glucose, UA Negative Negative   Ketones, UA Negative Negative   RBC, UA Negative Negative   Bilirubin, UA Negative Negative   Urobilinogen, Ur 0.2 0.2 - 1.0 mg/dL   Nitrite, UA Negative Negative   Microscopic Examination See below:    Assessment &  Plan:   1. Dysuria UA today notable for significant pyuria as well as epithelial cells, similar to prior and despite catheterized sample today.  Will start empiric cefuroxime and send for standard and atypical culture as well as urine cytology today.  We discussed that if her urine culture comes back negative, we may need to proceed with cystoscopy for further evaluation of her ongoing symptoms. - Urinalysis, Complete - CULTURE, URINE COMPREHENSIVE - cefUROXime (CEFTIN) 250 MG tablet; Take 1 tablet (250 mg total) by mouth 2 (two) times daily with a meal for 7 days.  Dispense: 14 tablet; Refill: 0 - Mycoplasma / ureaplasma culture - Cytology - Non PAP;  Return if symptoms worsen or fail to improve.  Debroah Loop, PA-C  [facility] [address] [place],  [address] [phone]

## 2021-05-25 LAB — CULTURE, URINE COMPREHENSIVE

## 2021-05-30 LAB — MYCOPLASMA / UREAPLASMA CULTURE
Mycoplasma hominis Culture: NEGATIVE
Ureaplasma urealyticum: NEGATIVE

## 2021-05-31 ENCOUNTER — Telehealth: Payer: Self-pay

## 2021-05-31 NOTE — Telephone Encounter (Signed)
-----   Message from Carman Ching, [place] sent at 05/30/2021  5:49 PM EST ----- Her culture grew bacteria that is not completely responsive to the cefuroxime I prescribed. Can you please call and check her symptoms? If resolved, no further treatment indicated. If persistent, recommend Bactrim DS BID x7 days. ----- Message ----- From: Nell Range Lab Results In Sent: 05/23/2021   1:36 PM EST To: Carman Ching, PA-C

## 2021-05-31 NOTE — Telephone Encounter (Signed)
Attempted to reach patient, no answer, no VM. Will try again later.

## 2021-05-31 NOTE — Telephone Encounter (Signed)
Attempted patient a second time, no answer, no VM. Will attempt tomorrow.

## 2021-06-15 ENCOUNTER — Telehealth: Payer: Self-pay | Admitting: Physician Assistant

## 2021-06-15 DIAGNOSIS — R300 Dysuria: Secondary | ICD-10-CM

## 2021-06-15 NOTE — Telephone Encounter (Signed)
Pt would like someone to give her a call back today. She said someone was supposed to call her in regards to her last visit and give her some results and no one has called and now she is experiencing symptoms again.

## 2021-06-15 NOTE — Telephone Encounter (Signed)
Spoke with patient and explained that I did try to reach her several times in regards to her culture results and patient states wrong phone number was listed in chart. New phone number updated. Patient states her symptoms were better while on the previous ABX prescribed but have since worsened with dysuria, increased frequency and urgency. Patient notes she is on IV infusion Remicade for psoriatic arthritis and believes she needs 10 days worth of ABX. Advised patient I would speak to [facility] and give her a call back tomorrow once I receive further instruction. Patient expressed understanding.

## 2021-06-16 MED ORDER — SULFAMETHOXAZOLE-TRIMETHOPRIM 800-160 MG PO TABS: 1.0000 | ORAL_TABLET | Freq: Two times a day (BID) | ORAL | 0 refills | Status: AC

## 2021-06-16 NOTE — Telephone Encounter (Signed)
Confirmed pt's pharmacy, advised pt of msg below, ABX sent, patient voiced understanding.

## 2021-06-16 NOTE — Addendum Note (Signed)
Addended by: Lizbeth Bark on: 06/16/2021 11:53 AM   Modules accepted: Orders

## 2021-09-28 IMAGING — MG MM DIGITAL SCREENING BILAT W/ TOMO AND CAD
8 series · 8 of 24 positions shown · non-contrast
Comparison: Previous exam(s).

CLINICAL DATA: Screening.

EXAM:
DIGITAL SCREENING BILATERAL MAMMOGRAM WITH TOMOSYNTHESIS AND CAD
TECHNIQUE: Bilateral screening digital craniocaudal and mediolateral oblique
mammograms were obtained. Bilateral screening digital breast
tomosynthesis was performed. The images were evaluated with
computer-aided detection.

[R CC synth-2D]
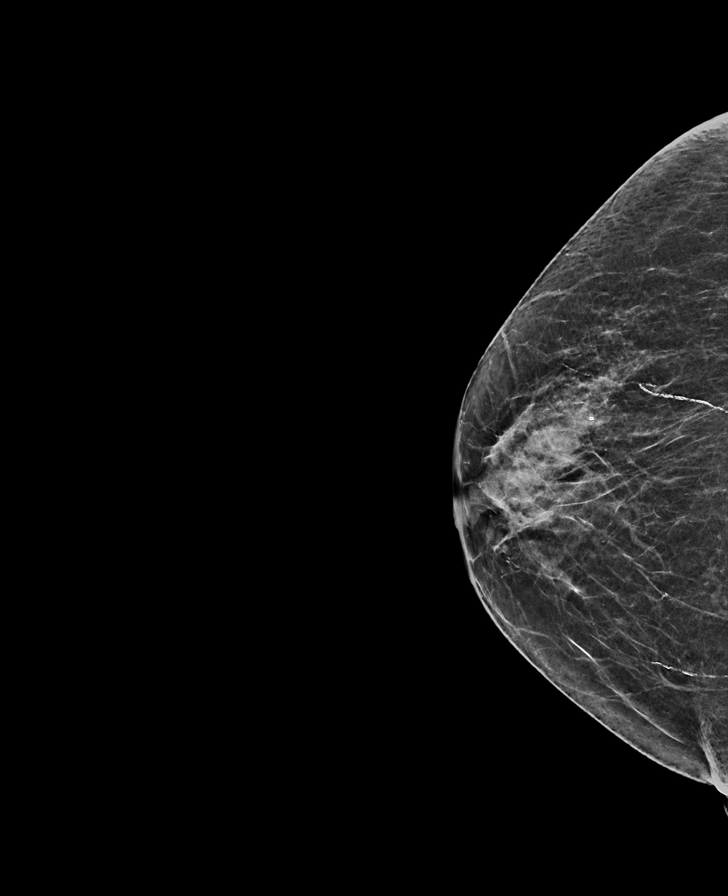

[L MLO synth-2D]
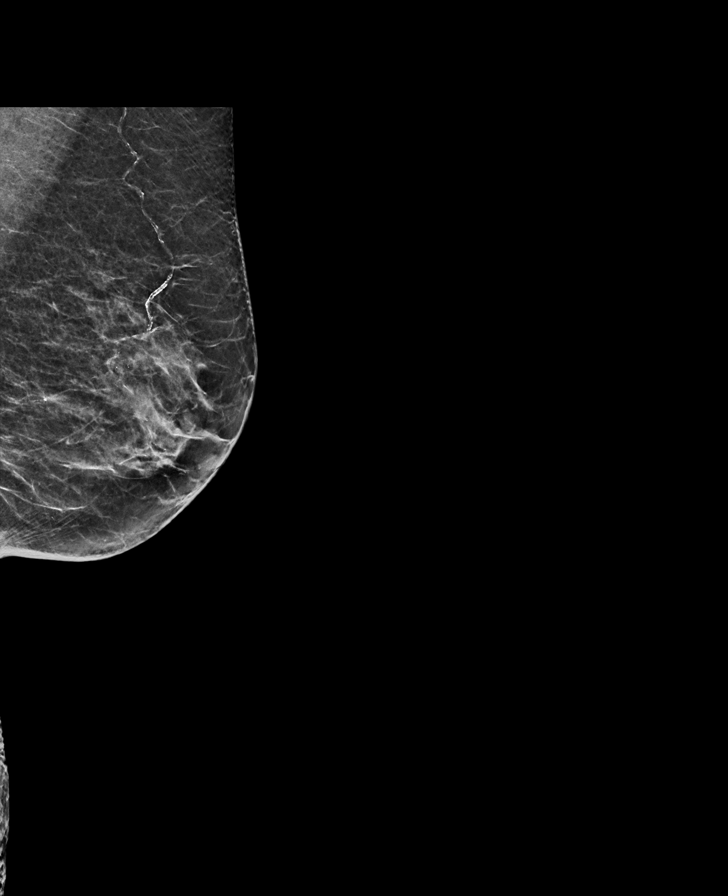

[R MLO synth-2D]
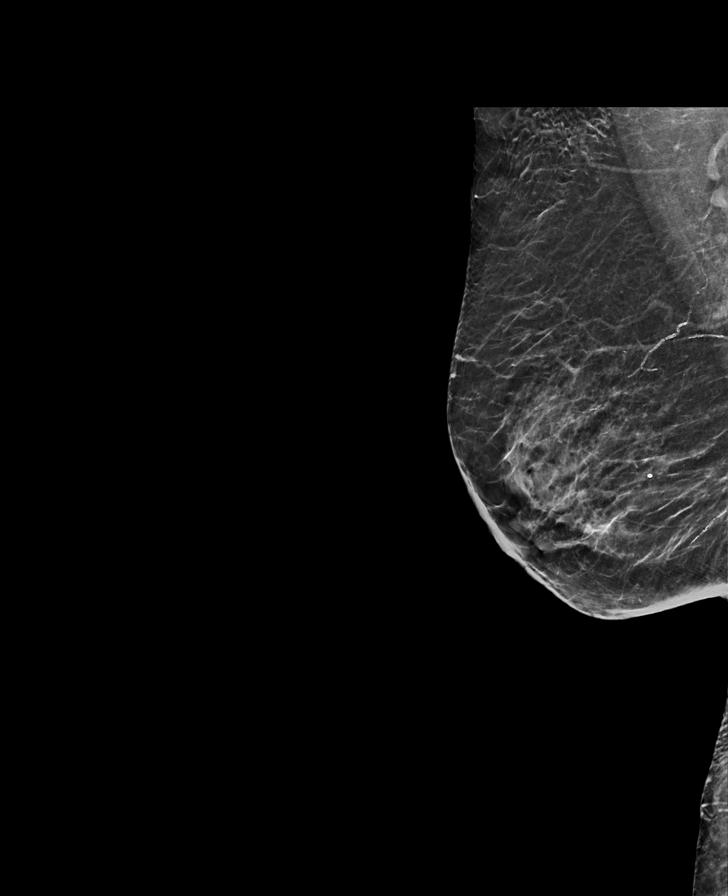

[L CC synth-2D]
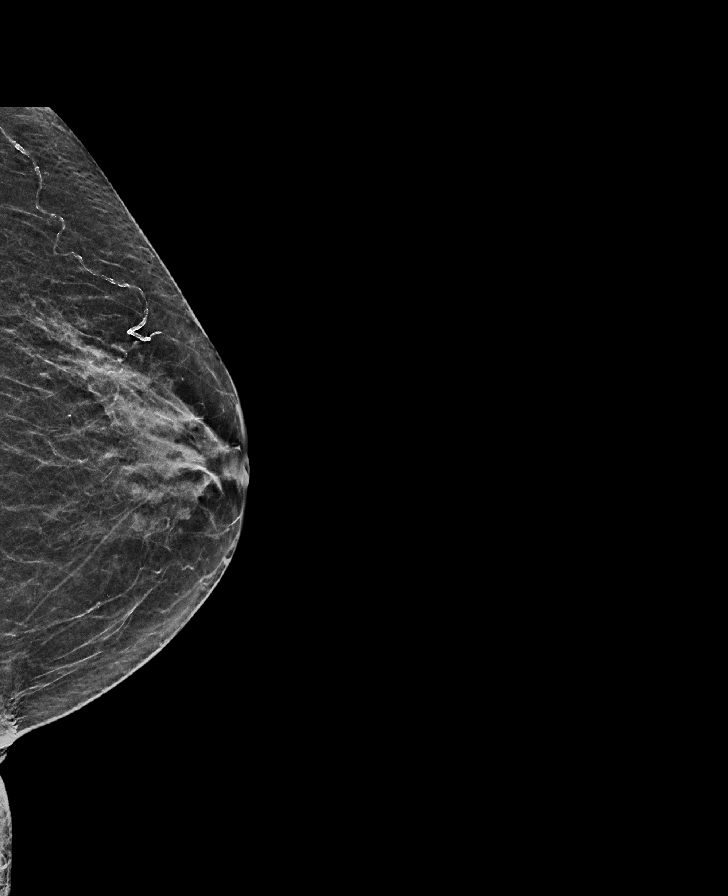

[R CC tomo · tomo slice 25/50.0]
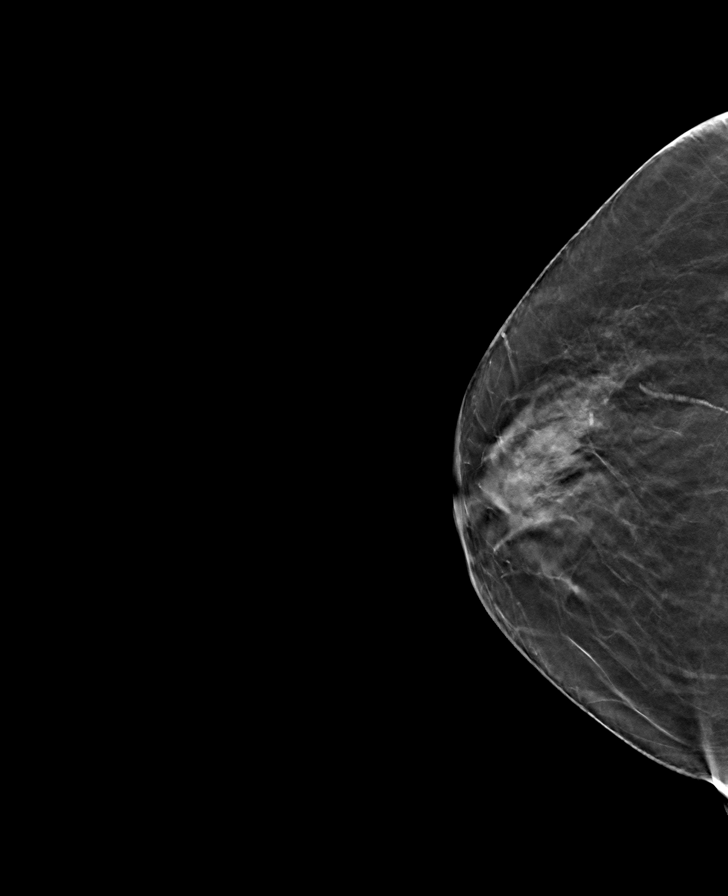

[L CC tomo · tomo slice 25/49.0]
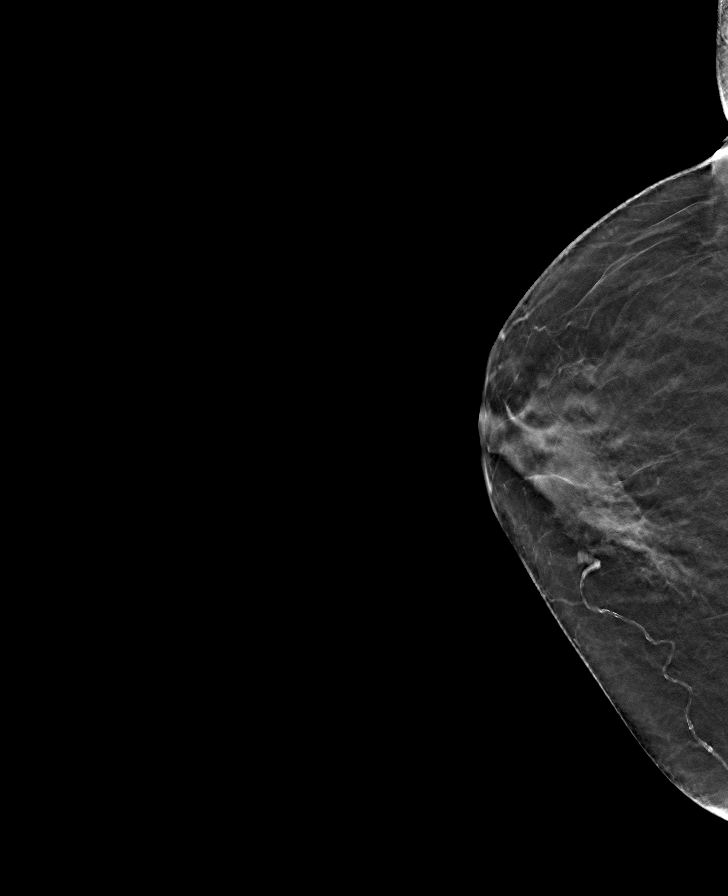

[L MLO tomo · tomo slice 27/54.0]
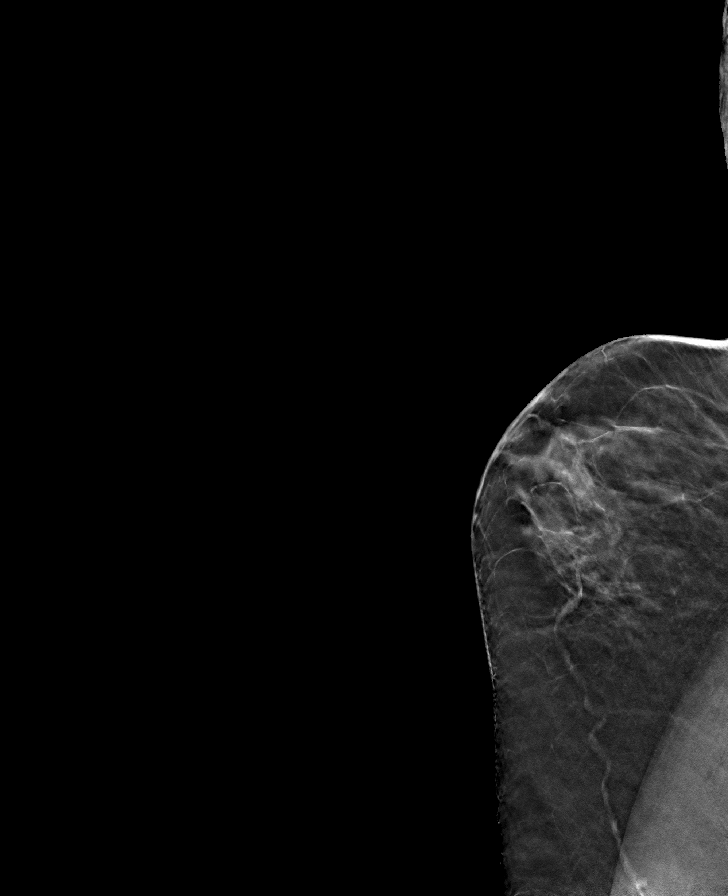

[R MLO tomo · tomo slice 29/57.0]
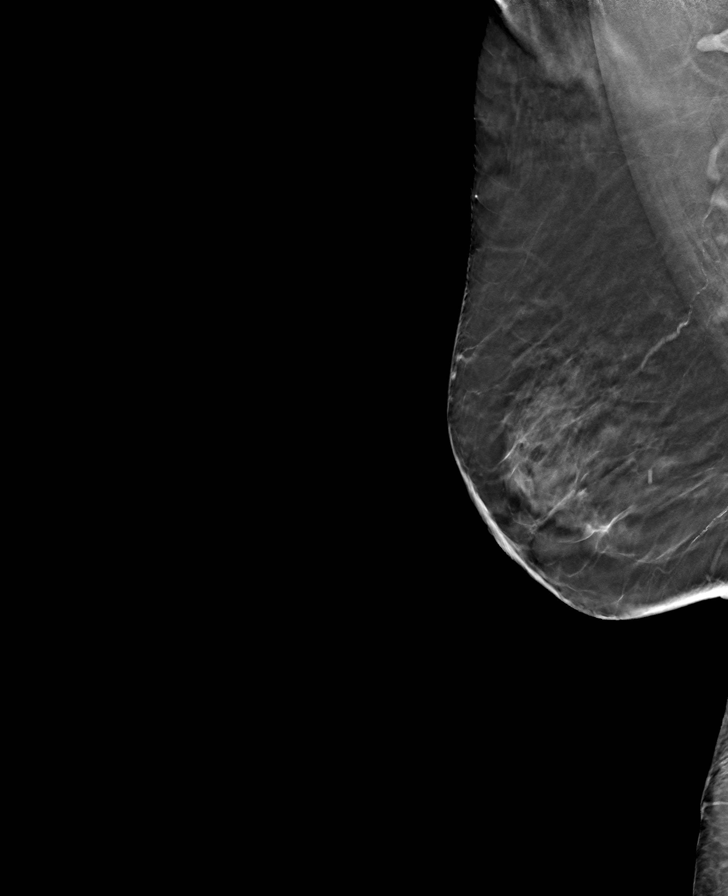

[8 of 24 positions shown; findings below may reference images not displayed]

ACR Breast Density Category c: The breast tissue is heterogeneously
dense, which may obscure small masses.
FINDINGS: There are no findings suspicious for malignancy.
IMPRESSION: No mammographic evidence of malignancy. A result letter of this
screening mammogram will be mailed directly to the patient.

RECOMMENDATION:
Screening mammogram in one year. (Code:Q3-W-BC3)

BI-RADS CATEGORY  1: Negative.

## 2022-04-14 NOTE — Progress Notes (Signed)
04/17/2022 10:42 AM   Patricia Lucas 29-Oct-1940 [id number]  Referring provider: Dorothey Baseman, MD [phone] [place],  [place] [address]  Urological history: 1. Cystitis -contributing factors of age, vaginal atrophy, bladder suspension (2010) -documented urine cultures over the last year   03/31/2022 No growth  03/22/2022 Klebsiella pneumoniae  10/04/2021 No growth -suppressive daily nitrofurantoin, vaginal estrogen cream and cranberry tablets twice daily  Chief Complaint  Patient presents with   Cystitis    1 year follow up    HPI: Patricia Lucas is a 81 y.o. female who presents today for yearly follow up.   On the OAB questionnaire, she indicates she has quite a bit of an uncomfortable urge to urinate.  She has quite a bit of nighttime urinations.  She has quite a bit of waking up at night because she has to urinate.  She had an Klebsiella pneumoniae UTI in 03/2022 and follow up urine culture was negative.   She is having nocturia x every hour, since just prior to the UTI.  She states she was on a bus tour where she was unable to use the restroom and had to hold her urine for two hours.  This was one year ago.  She was in such pain that she was sweating.  She is wondering if she damaged her bladder as she had a large void when she was ultimately able to go to the restroom.  She denied any blood in her urine at that time.    Patient denies any modifying or aggravating factors.  Patient denies any gross hematuria, dysuria or suprapubic/flank pain.  Patient denies any fevers, chills, nausea or vomiting.    UA yellow clear, SG 1.015, pH 5.5, small blood, trace ketone, 100 protein, trace leukocytes, few bacteria and granular casts present.   PMH: Past Medical History:  Diagnosis Date   Chronic cystitis    Chronic kidney disease    enhanced nsaid use (for psoriatic arthritis) caused renal disease.   Colon polyp    Complication of anesthesia 2017   vomitting  with waking up after colonoscopy   Diabetes mellitus without complication (HCC)    Generalized OA    GERD (gastroesophageal reflux disease)    Osteoporosis    Psoriasis    Psoriatic arthritis (HCC)    UTI (lower urinary tract infection)     Surgical History: Past Surgical History:  Procedure Laterality Date   ABDOMINAL HYSTERECTOMY  1969   APPENDECTOMY     BLADDER SUSPENSION  2010   CATARACT EXTRACTION, BILATERAL     fibula fract     OVARIAN CYST REMOVAL  2015   SHOULDER ARTHROSCOPY WITH SUBACROMIAL DECOMPRESSION AND BICEP TENDON REPAIR Left 08/15/2018   Procedure: SHOULDER ARTHROSCOPY WITH DEBRIDEMENT, DECOMPRESSION, ROTATOR CUFF REPAIR AND BICEP TENODESIS-LEFT;  Surgeon: Christena Flake, MD;  Location: [facility] ORS;  Service: Orthopedics;  Laterality: Left;    Home Medications:  Allergies as of 04/17/2022       Reactions   Nsaids    NOT TO TAKE DUE TO RENAL DISEASE        Medication List        Accurate as of April 17, 2022 10:42 AM. If you have any questions, ask your nurse or doctor.          STOP taking these medications    SYSTANE OP Stopped by: Harsha Yusko, PA-C       TAKE these medications    atorvastatin 20 MG  tablet Commonly known as: LIPITOR Take 20 mg by mouth daily.   BIOFREEZE EX Apply 1 application topically daily as needed (joint pain).   clobetasol cream 0.05 % Commonly known as: TEMOVATE Apply 1 application topically daily as needed (inflammation).   Cranberry 450 MG Tabs Take 450 mg by mouth daily.   estradiol 0.1 MG/GM vaginal cream Commonly known as: ESTRACE Place 1 Applicatorful vaginally 2 (two) times a week.   gabapentin 100 MG capsule Commonly known as: NEURONTIN Take 100 mg by mouth at bedtime.   HAIR/SKIN/NAILS PO Take 1 tablet by mouth daily.   inFLIXimab 100 MG injection Commonly known as: REMICADE Inject 400 mg into the muscle every 8 (eight) weeks. Next dose due 08/20/2018   lisinopril 10 MG  tablet Commonly known as: ZESTRIL Take 10 mg by mouth daily. What changed: Another medication with the same name was removed. Continue taking this medication, and follow the directions you see here. Changed by: Michiel Cowboy, PA-C   MAGNESIUM OXIDE PO Take 500 mg by mouth daily.   metFORMIN 500 MG tablet Commonly known as: GLUCOPHAGE Take 500 mg by mouth 2 (two) times daily.   metoprolol succinate 50 MG 24 hr tablet Commonly known as: TOPROL-XL Take 50 mg by mouth 2 (two) times daily.   nitrofurantoin (macrocrystal-monohydrate) 100 MG capsule Commonly known as: MACROBID Take 100 mg by mouth at bedtime.   OMEGA 3 PO Take 1 capsule by mouth 2 (two) times daily.   tiZANidine 4 MG tablet Commonly known as: ZANAFLEX Take 2 mg by mouth every 8 (eight) hours as needed.   traZODone 50 MG tablet Commonly known as: DESYREL Take 50 mg by mouth at bedtime as needed for sleep.   Vitamin D3 25 MCG (1000 UT) Caps Take 1,000 Units by mouth at bedtime.        Allergies:  Allergies  Allergen Reactions   Nsaids     NOT TO TAKE DUE TO RENAL DISEASE    Family History: Family History  Problem Relation Age of Onset   Breast cancer Mother 52   Breast cancer Sister    Breast cancer Maternal Aunt 60   Bladder Cancer Neg Hx    Kidney cancer Neg Hx    Prostate cancer Neg Hx     Social History:  reports that she quit smoking about 24 years ago. Her smoking use included cigarettes. She smoked an average of 1 pack per day. She has never used smokeless tobacco. She reports that she does not drink alcohol and does not use drugs.  ROS: Pertinent ROS in HPI  Physical Exam: BP (!) 150/83   Pulse 73   Ht 5\' 2"  (1.575 m)   Wt 149 lb (67.6 kg)   BMI 27.25 kg/m   Constitutional:  Well nourished. Alert and oriented, No acute distress. HEENT: [place] AT, moist mucus membranes.  Trachea midline Cardiovascular: No clubbing, cyanosis, or edema. Respiratory: Normal respiratory effort, no  increased work of breathing. Neurologic: Grossly intact, no focal deficits, moving all 4 extremities. Psychiatric: Normal mood and affect.    Laboratory Data: WBC (White Blood Cell Count) 4.1 - 10.2 10^3/uL 7.2   RBC (Red Blood Cell Count) 4.04 - 5.48 10^6/uL 3.43 Low    Hemoglobin 12.0 - 15.0 gm/dL 40.9 Low    Hematocrit 35.0 - 47.0 % 33.4 Low    MCV (Mean Corpuscular Volume) 80.0 - 100.0 fl 97.4   MCH (Mean Corpuscular Hemoglobin) 27.0 - 31.2 pg 30.3   MCHC (Mean  Corpuscular Hemoglobin Concentration) 32.0 - 36.0 gm/dL 16.1 Low    Platelet Count 150 - 450 10^3/uL 230   RDW-CV (Red Cell Distribution Width) 11.6 - 14.8 % 13.1   MPV (Mean Platelet Volume) 9.4 - 12.4 fl 9.7   Neutrophils 1.50 - 7.80 10^3/uL 4.01   Lymphocytes 1.00 - 3.60 10^3/uL 1.83   Monocytes 0.00 - 1.50 10^3/uL 1.01   Eosinophils 0.00 - 0.55 10^3/uL 0.29   Basophils 0.00 - 0.09 10^3/uL 0.04   Neutrophil % 32.0 - 70.0 % 55.7   Lymphocyte % 10.0 - 50.0 % 25.4   Monocyte % 4.0 - 13.0 % 14.0 High    Eosinophil % 1.0 - 5.0 % 4.0   Basophil% 0.0 - 2.0 % 0.6   Immature Granulocyte % <=0.7 % 0.3   Immature Granulocyte Count <=0.06 10^3/L 0.02   Resulting Agency  [facility]   Specimen Collected: 04/10/22 10:43   Performed by: [facility] Last Resulted: 04/10/22 15:28  Received From: [facility]  Result Received: 04/13/22 15:35   Glucose 70 - 110 mg/dL 86   Sodium 096 - 045 mmol/L 135 Low    Potassium 3.6 - 5.1 mmol/L 4.9   Chloride 97 - 109 mmol/L 102   Carbon Dioxide (CO2) 22.0 - 32.0 mmol/L 24.7   Urea Nitrogen (BUN) 7 - 25 mg/dL 22   Creatinine 0.6 - 1.1 mg/dL 1.1   Glomerular Filtration Rate (eGFR) >60 mL/min/1.73sq m 50 Low    Comment: CKD-EPI (2021) does not include patient's race in the calculation of eGFR.  Monitoring changes of plasma creatinine and eGFR over time is useful for monitoring kidney function.   Interpretive Ranges for eGFR (CKD-EPI 2021):    eGFR:       >60 mL/min/1.73 sq. m - Normal  eGFR:       30-59 mL/min/1.73 sq. m - Moderately Decreased  eGFR:       15-29 mL/min/1.73 sq. m  - Severely Decreased  eGFR:       < 15 mL/min/1.73 sq. m  - Kidney Failure    Note: These eGFR calculations do not apply in acute situations when eGFR is changing rapidly or patients on dialysis.  Calcium 8.7 - 10.3 mg/dL 9.8   AST  8 - 39 U/L 25   ALT  5 - 38 U/L 19   Alk Phos (alkaline Phosphatase) 34 - 104 U/L 50   Albumin 3.5 - 4.8 g/dL 4.6   Bilirubin, Total 0.3 - 1.2 mg/dL 0.3   Protein, Total 6.1 - 7.9 g/dL 7.7   A/G Ratio 1.0 - 5.0 gm/dL 1.5   Resulting Agency  [facility]   Specimen Collected: 04/10/22 10:42   Performed by: [facility] Last Resulted: 04/10/22 16:15  Received From: [facility]  Result Received: 04/13/22 15:35   Hemoglobin A1C 4.2 - 5.6 % 6.2 High    Average Blood Glucose (Calc) mg/dL 409   Resulting Agency  [facility]  Narrative Performed by [facility] - [facility] Normal Range:    4.2 - 5.6%  Increased Risk:  5.7 - 6.4%  Diabetes:        >= 6.5%  Glycemic Control for adults with diabetes:  <7%    Specimen Collected: 03/31/22 14:44   Performed by: [facility] Last Resulted: 03/31/22 17:59  Received From: [facility]  Result Received: 04/13/22  15:35     Urinalysis Component     Latest Ref Rng 04/17/2022  Color, Urine     YELLOW  YELLOW   Appearance     CLEAR  CLEAR   Specific Gravity, Urine     1.005 - 1.030  1.015   pH     5.0 - 8.0  5.5   Glucose, UA     NEGATIVE mg/dL NEGATIVE   Hgb urine dipstick     NEGATIVE  SMALL !   Bilirubin Urine     NEGATIVE  NEGATIVE   Ketones, ur     NEGATIVE mg/dL TRACE !   Protein     NEGATIVE mg/dL 161 !   Nitrite     NEGATIVE  NEGATIVE   Leukocytes,Ua     NEGATIVE  TRACE !   Squamous Epithelial / LPF     0 - 5  0-5   WBC, UA     0 - 5 WBC/hpf 0-5   RBC /  HPF     0 - 5 RBC/hpf 0-5   Bacteria, UA     NONE SEEN  FEW !   Granular Casts, UA PRESENT     Legend: ! Abnormal I have reviewed the labs.   Pertinent Imaging: N/A  Assessment & Plan:    1. Cystitis  -she feels that her UA is contaminated as she is having diarrhea secondary to have a meal w/ meat in the setting of alpha-gal, she will drop off another urine for UA and urine culture  -she has a smoking history and she feels her nocturia actually worsened prior to the UTI -she has not had any medication changes or dietary/fluid changes -she did mention that she has pedal edema "but she has had that for a long time" and there was an issue w/ HTN ~ 6 months ago that required her to start metoprolol 50 mg twice daily for control  -I will have her undergo a cystoscopy at this time to rule out CIS as she has had a severe increase of her nocturia associated with discomfort, she has a history of smoking and her follow up culture w/ PCP was negative -UA and urine culture next Monday for conformation UTI from 03/2022 has completely resolved -I have explained to the patient that they will  be scheduled for a cystoscopy in our office to evaluate their bladder.  The cystoscopy consists of passing a tube with a lens up through their urethra and into their urinary bladder.   We will inject the urethra with a lidocaine gel prior to introducing the cystoscope to help with any discomfort during the procedure.   After the procedure, they might experience blood in the urine and discomfort with urination.  This will abate after the first few voids.  I have  encouraged the patient to increase water intake  during this time.  Patient denies any allergies to lidocaine.     Return for cysto w/ Dr. Apolinar Junes for sudden increase in nocturia .  These notes generated with voice recognition software. I apologize for typographical errors.  Cloretta Ned  [facility] [address]  [address] [place], [place] [address] [phone]   I spent 30 minutes on the day of the encounter to include pre-visit record review, face-to-face time with the patient, and post-visit ordering of tests.

## 2022-04-17 ENCOUNTER — Encounter: Payer: Self-pay | Admitting: Urology

## 2022-04-17 ENCOUNTER — Other Ambulatory Visit: Payer: Self-pay

## 2022-04-17 ENCOUNTER — Ambulatory Visit (INDEPENDENT_AMBULATORY_CARE_PROVIDER_SITE_OTHER): Payer: Medicare Other | Admitting: Urology

## 2022-04-17 ENCOUNTER — Other Ambulatory Visit
Admission: RE | Admit: 2022-04-17 | Discharge: 2022-04-17 | Disposition: A | Discharge status: Home / Self Care | Payer: Medicare Other | Attending: Urology | Admitting: Urology

## 2022-04-17 VITALS — BP 150/83 | HR 73 | Ht 62.0 in | Wt 149.0 lb

## 2022-04-17 DIAGNOSIS — N3020 Other chronic cystitis without hematuria: Secondary | ICD-10-CM | POA: Insufficient documentation

## 2022-04-17 DIAGNOSIS — R32 Unspecified urinary incontinence: Secondary | ICD-10-CM | POA: Insufficient documentation

## 2022-04-17 LAB — URINALYSIS, COMPLETE (UACMP) WITH MICROSCOPIC
Bilirubin Urine: NEGATIVE
Glucose, UA: NEGATIVE mg/dL
Nitrite: NEGATIVE
Protein, ur: 100 mg/dL — AB
Specific Gravity, Urine: 1.015 (ref 1.005–1.030)
pH: 5.5 (ref 5.0–8.0)

## 2022-04-17 LAB — BLADDER SCAN AMB NON-IMAGING

## 2022-04-21 ENCOUNTER — Other Ambulatory Visit: Payer: Medicare Other | Admitting: Urology

## 2022-04-24 ENCOUNTER — Other Ambulatory Visit: Payer: Self-pay | Admitting: *Deleted

## 2022-04-24 ENCOUNTER — Other Ambulatory Visit
Admission: RE | Admit: 2022-04-24 | Discharge: 2022-04-24 | Disposition: A | Discharge status: Home / Self Care | Payer: Medicare Other | Attending: Urology | Admitting: Urology

## 2022-04-24 DIAGNOSIS — N3020 Other chronic cystitis without hematuria: Secondary | ICD-10-CM | POA: Insufficient documentation

## 2022-04-24 LAB — URINALYSIS, COMPLETE (UACMP) WITH MICROSCOPIC
Bilirubin Urine: NEGATIVE
Glucose, UA: NEGATIVE mg/dL
Nitrite: POSITIVE — AB
Protein, ur: 30 mg/dL — AB
Specific Gravity, Urine: 1.015 (ref 1.005–1.030)
pH: 5.5 (ref 5.0–8.0)

## 2022-04-24 NOTE — Addendum Note (Signed)
Addended by: Memory Argue H on: 04/24/2022 10:10 AM   Modules accepted: Orders

## 2022-04-26 ENCOUNTER — Telehealth: Payer: Self-pay

## 2022-04-26 LAB — URINE CULTURE: Culture: >=100000 — AB

## 2022-04-26 MED ORDER — SULFAMETHOXAZOLE-TRIMETHOPRIM 800-160 MG PO TABS: 1.0000 | ORAL_TABLET | Freq: Two times a day (BID) | ORAL | 0 refills | Status: AC

## 2022-04-26 NOTE — Telephone Encounter (Signed)
-----   Message from Nori Riis, PA-C sent at 04/26/2022 12:52 PM EDT ----- Please let Mrs. Manfre know that her urine culture was positive for infection and we need for her to start Septra DS twice daily for seven days.

## 2022-04-26 NOTE — Telephone Encounter (Signed)
Notified pt as advised, Abx Rx sent to pts pharmacy, pt verbalized understanding.

## 2022-05-03 ENCOUNTER — Other Ambulatory Visit: Payer: Self-pay

## 2022-05-03 DIAGNOSIS — R32 Unspecified urinary incontinence: Secondary | ICD-10-CM

## 2022-05-05 ENCOUNTER — Other Ambulatory Visit
Admission: RE | Admit: 2022-05-05 | Discharge: 2022-05-05 | Disposition: A | Discharge status: Home / Self Care | Payer: Medicare Other | Attending: Urology | Admitting: Urology

## 2022-05-05 ENCOUNTER — Ambulatory Visit (INDEPENDENT_AMBULATORY_CARE_PROVIDER_SITE_OTHER): Payer: Medicare Other | Admitting: Urology

## 2022-05-05 ENCOUNTER — Encounter: Payer: Self-pay | Admitting: Urology

## 2022-05-05 VITALS — BP 152/84 | HR 77 | Ht 62.0 in | Wt 149.0 lb

## 2022-05-05 DIAGNOSIS — N3090 Cystitis, unspecified without hematuria: Secondary | ICD-10-CM | POA: Diagnosis not present

## 2022-05-05 DIAGNOSIS — R32 Unspecified urinary incontinence: Secondary | ICD-10-CM | POA: Diagnosis present

## 2022-05-05 DIAGNOSIS — N3020 Other chronic cystitis without hematuria: Secondary | ICD-10-CM

## 2022-05-05 LAB — URINALYSIS, COMPLETE (UACMP) WITH MICROSCOPIC
Bilirubin Urine: NEGATIVE
Glucose, UA: NEGATIVE mg/dL
Hgb urine dipstick: NEGATIVE
Ketones, ur: NEGATIVE mg/dL
Nitrite: NEGATIVE
Protein, ur: 30 mg/dL — AB
Specific Gravity, Urine: 1.015 (ref 1.005–1.030)
pH: 5.5 (ref 5.0–8.0)

## 2022-05-05 NOTE — Progress Notes (Signed)
   05/05/22  CC:  Chief Complaint  Patient presents with   Cysto    HPI: 81 year old female with a personal history of worsening nocturia who presents today for cystoscopic evaluation as recommended by Zara Council.  NED. A&Ox3.   No respiratory distress   Abd soft, NT, ND Normal external genitalia with patent urethral meatus  Cystoscopy Procedure Note  Patient identification was confirmed, informed consent was obtained, and patient was prepped using Betadine solution.  Lidocaine jelly was administered per urethral meatus.    Procedure: - Flexible cystoscope introduced, without any difficulty.   - Thorough search of the bladder revealed:    normal urethral meatus    normal urothelium    no stones    no ulcers     no tumors    no urethral polyps    no trabeculation  - Ureteral orifices were normal in position and appearance.  There is intervening inflammation between the UOs consistent with trigonitis which does not appear to be pathologic.  Post-Procedure: - Patient tolerated the procedure well  Assessment/ Plan:  1. Chronic cystitis Cystoscopy today with evidence of trigonitis, but otherwise no pathology or concern for underlying malignancy  Patient was reassured  2. Urinary incontinence, unspecified type Slightly improved nocturia, will have her follow-up with Larene Beach to discuss ongoing management of her urinary symptoms     Return in about 4 weeks (around 06/02/2022) for [facility] symptom recheck.  Hollice Espy, MD

## 2022-05-31 ENCOUNTER — Other Ambulatory Visit: Payer: Self-pay | Admitting: Family Medicine

## 2022-05-31 DIAGNOSIS — Z1231 Encounter for screening mammogram for malignant neoplasm of breast: Secondary | ICD-10-CM

## 2022-06-04 NOTE — Progress Notes (Unsigned)
06/05/2022 6:02 PM   Patricia Lucas 05/16/1941 [id number]  Referring provider: Dorothey Baseman, MD [phone] [address] [place],  [place] [address]  Urological history: 1. Cystitis -contributing factors of age, vaginal atrophy, bladder suspension (2010) -documented urine cultures over the last year   03/31/2022 No growth  03/22/2022 Klebsiella pneumoniae  10/04/2021 No growth -suppressive daily nitrofurantoin, vaginal estrogen cream and cranberry tablets twice daily  No chief complaint on file.   HPI: Patricia Lucas is a 81 y.o. female who presents today for follow up.   At her visit on 04/17/2022, she had noted a sudden increase in her nocturia.  UA yellow clear, SG 1.015, pH 5.5, small blood, trace ketone, 100 protein, trace leukocytes, few bacteria and granular casts present.  Her urine culture was positive for Klebsiella pneumonia.  She was treated with culture appropriate antibiotics.  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      PMH: Past Medical History:  Diagnosis Date   Chronic cystitis    Chronic kidney disease    enhanced nsaid use (for psoriatic arthritis) caused renal disease.   Colon polyp    Complication of anesthesia 2017   vomitting with waking up after colonoscopy   Diabetes mellitus without complication (HCC)    Generalized OA    GERD (gastroesophageal reflux disease)    Osteoporosis    Psoriasis    Psoriatic arthritis (HCC)    UTI (lower urinary tract infection)     Surgical History: Past Surgical History:  Procedure Laterality Date   ABDOMINAL HYSTERECTOMY  1969   APPENDECTOMY     BLADDER SUSPENSION  2010   CATARACT EXTRACTION, BILATERAL     fibula fract     OVARIAN CYST REMOVAL  2015   SHOULDER ARTHROSCOPY WITH SUBACROMIAL DECOMPRESSION AND BICEP TENDON REPAIR Left 08/15/2018   Procedure: SHOULDER ARTHROSCOPY WITH DEBRIDEMENT, DECOMPRESSION, ROTATOR CUFF REPAIR AND BICEP TENODESIS-LEFT;  Surgeon: Christena Flake, MD;  Location: [facility] ORS;  Service:  Orthopedics;  Laterality: Left;    Home Medications:  Allergies as of 06/05/2022       Reactions   Nsaids    NOT TO TAKE DUE TO RENAL DISEASE        Medication List        Accurate as of June 04, 2022  6:02 PM. If you have any questions, ask your nurse or doctor.          atorvastatin 20 MG tablet Commonly known as: LIPITOR Take 20 mg by mouth daily.   BIOFREEZE EX Apply 1 application topically daily as needed (joint pain).   clobetasol cream 0.05 % Commonly known as: TEMOVATE Apply 1 application topically daily as needed (inflammation).   Cranberry 450 MG Tabs Take 450 mg by mouth daily.   estradiol 0.1 MG/GM vaginal cream Commonly known as: ESTRACE Place 1 Applicatorful vaginally 2 (two) times a week.   gabapentin 100 MG capsule Commonly known as: NEURONTIN Take 100 mg by mouth at bedtime.   HAIR/SKIN/NAILS PO Take 1 tablet by mouth daily.   inFLIXimab 100 MG injection Commonly known as: REMICADE Inject 400 mg into the muscle every 8 (eight) weeks. Next dose due 08/20/2018   lisinopril 10 MG tablet Commonly known as: ZESTRIL Take 10 mg by mouth daily.   MAGNESIUM OXIDE PO Take 500 mg by mouth daily.   metFORMIN 500 MG tablet Commonly known as: GLUCOPHAGE Take 500 mg by mouth 2 (two) times daily.   metoprolol succinate 50 MG 24 hr tablet Commonly  known as: TOPROL-XL Take 50 mg by mouth 2 (two) times daily.   OMEGA 3 PO Take 1 capsule by mouth 2 (two) times daily.   tiZANidine 4 MG tablet Commonly known as: ZANAFLEX Take 2 mg by mouth every 8 (eight) hours as needed.   traZODone 50 MG tablet Commonly known as: DESYREL Take 50 mg by mouth at bedtime as needed for sleep.   Vitamin D3 25 MCG (1000 UT) Caps Take 1,000 Units by mouth at bedtime.        Allergies:  Allergies  Allergen Reactions   Nsaids     NOT TO TAKE DUE TO RENAL DISEASE    Family History: Family History  Problem Relation Age of Onset   Breast cancer Mother  92   Breast cancer Sister    Breast cancer Maternal Aunt 60   Bladder Cancer Neg Hx    Kidney cancer Neg Hx    Prostate cancer Neg Hx     Social History:  reports that she quit smoking about 24 years ago. Her smoking use included cigarettes. She smoked an average of 1 pack per day. She has never used smokeless tobacco. She reports that she does not drink alcohol and does not use drugs.  ROS: Pertinent ROS in HPI  Physical Exam: There were no vitals taken for this visit.  Constitutional:  Well nourished. Alert and oriented, No acute distress. HEENT: [place] AT, moist mucus membranes.  Trachea midline, no masses. Cardiovascular: No clubbing, cyanosis, or edema. Respiratory: Normal respiratory effort, no increased work of breathing. GU: No CVA tenderness.  No bladder fullness or masses. Vulvovaginal atrophy w/ pallor, loss of rugae, introital retraction, excoriations.  Vulvar thinning, fusion of labia, clitoral hood retraction, prominent urethral meatus.   *** external genitalia, *** pubic hair distribution, no lesions.  Normal urethral meatus, no lesions, no prolapse, no discharge.   No urethral masses, tenderness and/or tenderness. No bladder fullness, tenderness or masses. *** vagina mucosa, *** estrogen effect, no discharge, no lesions, *** pelvic support, *** cystocele and *** rectocele noted.  No cervical motion tenderness.  Uterus is freely mobile and non-fixed.  No adnexal/parametria masses or tenderness noted.  Anus and perineum are without rashes or lesions.   ***  Neurologic: Grossly intact, no focal deficits, moving all 4 extremities. Psychiatric: Normal mood and affect.      Laboratory Data:   Pertinent Imaging: N/A  Assessment & Plan:    1. Cystitis  -***  2. Nocturia  - I explained to the patient that nocturia is often multi-factorial and difficult to treat.  Sleeping disorders, heart conditions, peripheral vascular disease, diabetes, an enlarged prostate for men, an  urethral stricture causing bladder outlet obstruction and/or certain medications can contribute to nocturia.  - I have suggested that the patient avoid caffeine after noon and alcohol in the evening.  He or she may also benefit from fluid restrictions after 6:00 in the evening and voiding just prior to bedtime.  - I have explained that research studies have showed that over 84% of patients with sleep apnea reported frequent nighttime urination.   With sleep apnea, oxygen decreases, carbon dioxide increases, the blood become more acidic, the heart rate drops and blood vessels in the lung constrict.  The body is then alerted that something is very wrong. The sleeper must wake enough to reopen the airway. By this time, the heart is racing and experiences a false signal of fluid overload. The heart excretes a hormone-like protein that tells  the body to get rid of sodium and water, resulting in nocturia.  -  I also informed the patient that a recent study noted that decreasing sodium intake to 2.3 grams daily, if they don't have issues with hyponatremia, can also reduce the number of nightly voids  - There is also an increased incidence in sleep apnea with menopause, symptoms include night sweats, daytime sleepiness, depressed mood, and cognitive complaints like poor concentration or problems with short-term memory ***  - The patient may benefit from a discussion with his or her primary care physician to see if he or she has risk factors for sleep apnea or other sleep disturbances and obtaining a sleep study.   No follow-ups on file.  These notes generated with voice recognition software. I apologize for typographical errors.  Cloretta Ned  [facility] [address]  [address] [place], [place] [address] [phone]

## 2022-06-05 ENCOUNTER — Ambulatory Visit (INDEPENDENT_AMBULATORY_CARE_PROVIDER_SITE_OTHER): Payer: Medicare Other | Admitting: Urology

## 2022-06-05 ENCOUNTER — Encounter: Payer: Self-pay | Admitting: Urology

## 2022-06-05 VITALS — BP 156/83 | HR 87 | Ht 62.0 in | Wt 146.2 lb

## 2022-06-05 DIAGNOSIS — R351 Nocturia: Secondary | ICD-10-CM | POA: Diagnosis not present

## 2022-06-05 DIAGNOSIS — N3020 Other chronic cystitis without hematuria: Secondary | ICD-10-CM

## 2022-06-05 LAB — BLADDER SCAN AMB NON-IMAGING

## 2022-06-05 MED ORDER — MIRABEGRON ER 25 MG PO TB24: 25.0000 mg | ORAL_TABLET | Freq: Every day | ORAL | 0 refills | Status: DC

## 2022-06-08 ENCOUNTER — Ambulatory Visit
Admission: RE | Admit: 2022-06-08 | Discharge: 2022-06-08 | Disposition: A | Discharge status: Home / Self Care | Payer: Medicare Other | Source: Ambulatory Visit | Attending: Family Medicine | Admitting: Family Medicine

## 2022-06-08 DIAGNOSIS — Z1231 Encounter for screening mammogram for malignant neoplasm of breast: Secondary | ICD-10-CM | POA: Diagnosis present

## 2022-07-13 ENCOUNTER — Other Ambulatory Visit: Payer: Self-pay

## 2022-07-13 DIAGNOSIS — Z86010 Personal history of colonic polyps: Secondary | ICD-10-CM

## 2022-07-13 DIAGNOSIS — Z9841 Cataract extraction status, right eye: Secondary | ICD-10-CM

## 2022-07-13 DIAGNOSIS — E1122 Type 2 diabetes mellitus with diabetic chronic kidney disease: Secondary | ICD-10-CM | POA: Diagnosis present

## 2022-07-13 DIAGNOSIS — K529 Noninfective gastroenteritis and colitis, unspecified: Secondary | ICD-10-CM | POA: Diagnosis not present

## 2022-07-13 DIAGNOSIS — Z87891 Personal history of nicotine dependence: Secondary | ICD-10-CM

## 2022-07-13 DIAGNOSIS — Z66 Do not resuscitate: Secondary | ICD-10-CM | POA: Diagnosis present

## 2022-07-13 DIAGNOSIS — R6520 Severe sepsis without septic shock: Secondary | ICD-10-CM | POA: Diagnosis present

## 2022-07-13 DIAGNOSIS — D631 Anemia in chronic kidney disease: Secondary | ICD-10-CM | POA: Diagnosis present

## 2022-07-13 DIAGNOSIS — Z803 Family history of malignant neoplasm of breast: Secondary | ICD-10-CM

## 2022-07-13 DIAGNOSIS — M810 Age-related osteoporosis without current pathological fracture: Secondary | ICD-10-CM | POA: Diagnosis present

## 2022-07-13 DIAGNOSIS — Z7984 Long term (current) use of oral hypoglycemic drugs: Secondary | ICD-10-CM

## 2022-07-13 DIAGNOSIS — M159 Polyosteoarthritis, unspecified: Secondary | ICD-10-CM | POA: Diagnosis present

## 2022-07-13 DIAGNOSIS — L4050 Arthropathic psoriasis, unspecified: Secondary | ICD-10-CM | POA: Diagnosis present

## 2022-07-13 DIAGNOSIS — A419 Sepsis, unspecified organism: Principal | ICD-10-CM | POA: Diagnosis present

## 2022-07-13 DIAGNOSIS — K7460 Unspecified cirrhosis of liver: Secondary | ICD-10-CM | POA: Diagnosis present

## 2022-07-13 DIAGNOSIS — Z9842 Cataract extraction status, left eye: Secondary | ICD-10-CM

## 2022-07-13 DIAGNOSIS — K219 Gastro-esophageal reflux disease without esophagitis: Secondary | ICD-10-CM | POA: Diagnosis present

## 2022-07-13 DIAGNOSIS — E785 Hyperlipidemia, unspecified: Secondary | ICD-10-CM | POA: Diagnosis present

## 2022-07-13 DIAGNOSIS — I129 Hypertensive chronic kidney disease with stage 1 through stage 4 chronic kidney disease, or unspecified chronic kidney disease: Secondary | ICD-10-CM | POA: Diagnosis present

## 2022-07-13 DIAGNOSIS — Z79899 Other long term (current) drug therapy: Secondary | ICD-10-CM

## 2022-07-13 DIAGNOSIS — N1831 Chronic kidney disease, stage 3a: Secondary | ICD-10-CM | POA: Diagnosis present

## 2022-07-13 DIAGNOSIS — Z90710 Acquired absence of both cervix and uterus: Secondary | ICD-10-CM

## 2022-07-13 NOTE — ED Triage Notes (Signed)
Pt comes from home via [facility] c/o rectal bleeding and lower abd pain. Pt states that around 9pm she had an episode of intense abd pain that lasted about an hour. Pt went to use bathroom to have bowel movement and noticed dark stool the first time and then blood in toilet bowl when she had a second bowel movement. Pt also having nausea and vomiting, vomited twice at home Pt in NAD at this time, denies CP, SOB.

## 2022-07-13 NOTE — ED Notes (Signed)
First nurse note:  Pt to ED via EMS from home c/o abd pain and rectal bleeding since 2100 tonight.  3 episodes BRB without hx of same, denies thinners. EMS vitals 204/100, CBG 117 (Hx HTN and DBM).

## 2022-07-14 ENCOUNTER — Encounter
Admission: EM | Disposition: A | Discharge status: Home / Self Care | Payer: Self-pay | Source: Home / Self Care | Attending: Internal Medicine

## 2022-07-14 ENCOUNTER — Inpatient Hospital Stay
Admission: EM | Admit: 2022-07-14 | Discharge: 2022-07-17 | DRG: 872 | Disposition: A | Discharge status: Home / Self Care | Payer: Medicare Other | Attending: Internal Medicine | Admitting: Internal Medicine

## 2022-07-14 ENCOUNTER — Inpatient Hospital Stay: Payer: Medicare Other | Admitting: Anesthesiology

## 2022-07-14 ENCOUNTER — Other Ambulatory Visit: Payer: Self-pay

## 2022-07-14 ENCOUNTER — Emergency Department: Payer: Medicare Other

## 2022-07-14 DIAGNOSIS — E785 Hyperlipidemia, unspecified: Secondary | ICD-10-CM | POA: Diagnosis present

## 2022-07-14 DIAGNOSIS — I129 Hypertensive chronic kidney disease with stage 1 through stage 4 chronic kidney disease, or unspecified chronic kidney disease: Secondary | ICD-10-CM | POA: Diagnosis present

## 2022-07-14 DIAGNOSIS — D649 Anemia, unspecified: Secondary | ICD-10-CM | POA: Diagnosis present

## 2022-07-14 DIAGNOSIS — K7460 Unspecified cirrhosis of liver: Secondary | ICD-10-CM | POA: Diagnosis present

## 2022-07-14 DIAGNOSIS — Z9842 Cataract extraction status, left eye: Secondary | ICD-10-CM | POA: Diagnosis not present

## 2022-07-14 DIAGNOSIS — K219 Gastro-esophageal reflux disease without esophagitis: Secondary | ICD-10-CM | POA: Diagnosis present

## 2022-07-14 DIAGNOSIS — Z87891 Personal history of nicotine dependence: Secondary | ICD-10-CM | POA: Diagnosis not present

## 2022-07-14 DIAGNOSIS — Z90710 Acquired absence of both cervix and uterus: Secondary | ICD-10-CM | POA: Diagnosis not present

## 2022-07-14 DIAGNOSIS — L4050 Arthropathic psoriasis, unspecified: Secondary | ICD-10-CM | POA: Diagnosis present

## 2022-07-14 DIAGNOSIS — Z86010 Personal history of colonic polyps: Secondary | ICD-10-CM | POA: Diagnosis not present

## 2022-07-14 DIAGNOSIS — I10 Essential (primary) hypertension: Secondary | ICD-10-CM | POA: Diagnosis present

## 2022-07-14 DIAGNOSIS — D631 Anemia in chronic kidney disease: Secondary | ICD-10-CM | POA: Diagnosis present

## 2022-07-14 DIAGNOSIS — R6520 Severe sepsis without septic shock: Secondary | ICD-10-CM | POA: Diagnosis present

## 2022-07-14 DIAGNOSIS — Z66 Do not resuscitate: Secondary | ICD-10-CM | POA: Diagnosis present

## 2022-07-14 DIAGNOSIS — K922 Gastrointestinal hemorrhage, unspecified: Secondary | ICD-10-CM

## 2022-07-14 DIAGNOSIS — M810 Age-related osteoporosis without current pathological fracture: Secondary | ICD-10-CM | POA: Diagnosis present

## 2022-07-14 DIAGNOSIS — M159 Polyosteoarthritis, unspecified: Secondary | ICD-10-CM | POA: Diagnosis present

## 2022-07-14 DIAGNOSIS — N1831 Chronic kidney disease, stage 3a: Secondary | ICD-10-CM | POA: Diagnosis present

## 2022-07-14 DIAGNOSIS — R1030 Lower abdominal pain, unspecified: Secondary | ICD-10-CM

## 2022-07-14 DIAGNOSIS — Z7984 Long term (current) use of oral hypoglycemic drugs: Secondary | ICD-10-CM | POA: Diagnosis not present

## 2022-07-14 DIAGNOSIS — K529 Noninfective gastroenteritis and colitis, unspecified: Secondary | ICD-10-CM | POA: Diagnosis present

## 2022-07-14 DIAGNOSIS — Z79899 Other long term (current) drug therapy: Secondary | ICD-10-CM | POA: Diagnosis not present

## 2022-07-14 DIAGNOSIS — E1122 Type 2 diabetes mellitus with diabetic chronic kidney disease: Secondary | ICD-10-CM | POA: Diagnosis present

## 2022-07-14 DIAGNOSIS — E1129 Type 2 diabetes mellitus with other diabetic kidney complication: Secondary | ICD-10-CM | POA: Diagnosis present

## 2022-07-14 DIAGNOSIS — A419 Sepsis, unspecified organism: Secondary | ICD-10-CM | POA: Diagnosis present

## 2022-07-14 DIAGNOSIS — Z803 Family history of malignant neoplasm of breast: Secondary | ICD-10-CM | POA: Diagnosis not present

## 2022-07-14 DIAGNOSIS — Z9841 Cataract extraction status, right eye: Secondary | ICD-10-CM | POA: Diagnosis not present

## 2022-07-14 HISTORY — PX: FLEXIBLE SIGMOIDOSCOPY: SHX5431

## 2022-07-14 LAB — URINALYSIS, ROUTINE W REFLEX MICROSCOPIC
Bacteria, UA: NONE SEEN
Bilirubin Urine: NEGATIVE
Glucose, UA: NEGATIVE mg/dL
Hgb urine dipstick: NEGATIVE
Ketones, ur: 5 mg/dL — AB
Leukocytes,Ua: NEGATIVE
Nitrite: NEGATIVE
Protein, ur: 30 mg/dL — AB
Specific Gravity, Urine: 1.027 (ref 1.005–1.030)
pH: 6 (ref 5.0–8.0)

## 2022-07-14 LAB — CBC
HCT: 31.3 % — ABNORMAL LOW (ref 36.0–46.0)
HCT: 35.7 % — ABNORMAL LOW (ref 36.0–46.0)
HCT: 36 % (ref 36.0–46.0)
Hemoglobin: 10.1 g/dL — ABNORMAL LOW (ref 12.0–15.0)
Hemoglobin: 11.6 g/dL — ABNORMAL LOW (ref 12.0–15.0)
Hemoglobin: 11.6 g/dL — ABNORMAL LOW (ref 12.0–15.0)
MCH: 29.8 pg (ref 26.0–34.0)
MCH: 30.2 pg (ref 26.0–34.0)
MCH: 30.4 pg (ref 26.0–34.0)
MCHC: 32.2 g/dL (ref 30.0–36.0)
MCHC: 32.3 g/dL (ref 30.0–36.0)
MCHC: 32.5 g/dL (ref 30.0–36.0)
MCV: 91.8 fL (ref 80.0–100.0)
MCV: 93.7 fL (ref 80.0–100.0)
MCV: 94.5 fL (ref 80.0–100.0)
Platelets: 185 10*3/uL (ref 150–400)
Platelets: 193 10*3/uL (ref 150–400)
Platelets: 243 10*3/uL (ref 150–400)
RBC: 3.34 MIL/uL — ABNORMAL LOW (ref 3.87–5.11)
RBC: 3.81 MIL/uL — ABNORMAL LOW (ref 3.87–5.11)
RBC: 3.89 MIL/uL (ref 3.87–5.11)
RDW: 12.8 % (ref 11.5–15.5)
RDW: 12.9 % (ref 11.5–15.5)
RDW: 13.2 % (ref 11.5–15.5)
WBC: 12.9 10*3/uL — ABNORMAL HIGH (ref 4.0–10.5)
WBC: 16.8 10*3/uL — ABNORMAL HIGH (ref 4.0–10.5)
WBC: 18.5 10*3/uL — ABNORMAL HIGH (ref 4.0–10.5)
nRBC: 0 % (ref 0.0–0.2)
nRBC: 0 % (ref 0.0–0.2)
nRBC: 0 % (ref 0.0–0.2)

## 2022-07-14 LAB — GLUCOSE, CAPILLARY
Glucose-Capillary: 134 mg/dL — ABNORMAL HIGH (ref 70–99)
Glucose-Capillary: 115 mg/dL — ABNORMAL HIGH (ref 70–99)

## 2022-07-14 LAB — PROTIME-INR
INR: 1.1 (ref 0.8–1.2)
Prothrombin Time: 14 seconds (ref 11.4–15.2)

## 2022-07-14 LAB — PROCALCITONIN: Procalcitonin: <0.1 ng/mL

## 2022-07-14 LAB — TYPE AND SCREEN
ABO/RH(D): A POS
Antibody Screen: NEGATIVE

## 2022-07-14 LAB — COMPREHENSIVE METABOLIC PANEL
ALT: 22 U/L (ref 0–44)
AST: 33 U/L (ref 15–41)
Albumin: 4.5 g/dL (ref 3.5–5.0)
Alkaline Phosphatase: 54 U/L (ref 38–126)
Anion gap: 12 (ref 5–15)
BUN: 23 mg/dL (ref 8–23)
CO2: 22 mmol/L (ref 22–32)
Calcium: 9.5 mg/dL (ref 8.9–10.3)
Chloride: 100 mmol/L (ref 98–111)
Creatinine, Ser: 1.1 mg/dL — ABNORMAL HIGH (ref 0.44–1.00)
GFR, Estimated: 50 mL/min — ABNORMAL LOW (ref >60)
Glucose, Bld: 128 mg/dL — ABNORMAL HIGH (ref 70–99)
Potassium: 4.6 mmol/L (ref 3.5–5.1)
Sodium: 134 mmol/L — ABNORMAL LOW (ref 135–145)
Total Bilirubin: 0.5 mg/dL (ref 0.3–1.2)
Total Protein: 8.4 g/dL — ABNORMAL HIGH (ref 6.5–8.1)

## 2022-07-14 LAB — LACTIC ACID, PLASMA
Lactic Acid, Venous: 2.2 mmol/L (ref 0.5–1.9)
Lactic Acid, Venous: 1.9 mmol/L (ref 0.5–1.9)

## 2022-07-14 LAB — CBG MONITORING, ED
Glucose-Capillary: 153 mg/dL — ABNORMAL HIGH (ref 70–99)
Glucose-Capillary: 116 mg/dL — ABNORMAL HIGH (ref 70–99)

## 2022-07-14 LAB — APTT: aPTT: 29 seconds (ref 24–36)

## 2022-07-14 SURGERY — SIGMOIDOSCOPY, FLEXIBLE
Anesthesia: General

## 2022-07-14 MED ORDER — INSULIN ASPART 100 UNIT/ML IJ SOLN
0.0000 [IU] | Freq: Three times a day (TID) | INTRAMUSCULAR | Status: DC
  Administered 2022-07-14: 2 [IU] via SUBCUTANEOUS
  Administered 2022-07-14 – 2022-07-15 (×2): 1 [IU] via SUBCUTANEOUS
  Administered 2022-07-16 – 2022-07-17 (×3): 2 [IU] via SUBCUTANEOUS
  Administered 2022-07-17: 1 [IU] via SUBCUTANEOUS
  Filled 2022-07-14 (×7): qty 1

## 2022-07-14 MED ORDER — ESTRADIOL 0.1 MG/GM VA CREA: 1.0000 | TOPICAL_CREAM | VAGINAL | Status: DC

## 2022-07-14 MED ORDER — MORPHINE SULFATE (PF) 2 MG/ML IV SOLN
1.0000 mg | INTRAVENOUS | Status: DC | PRN
  Administered 2022-07-14: 1 mg via INTRAVENOUS
  Filled 2022-07-14: qty 1

## 2022-07-14 MED ORDER — OMEGA-3-ACID ETHYL ESTERS 1 G PO CAPS
1000.0000 mg | ORAL_CAPSULE | Freq: Two times a day (BID) | ORAL | Status: DC
  Administered 2022-07-14 – 2022-07-17 (×7): 1000 mg via ORAL
  Filled 2022-07-14 (×7): qty 1

## 2022-07-14 MED ORDER — FLUTICASONE PROPIONATE 50 MCG/ACT NA SUSP: 2.0000 | Freq: Every day | NASAL | Status: DC

## 2022-07-14 MED ORDER — METRONIDAZOLE 500 MG/100ML IV SOLN
500.0000 mg | Freq: Two times a day (BID) | INTRAVENOUS | Status: DC
  Administered 2022-07-14 – 2022-07-17 (×7): 500 mg via INTRAVENOUS
  Filled 2022-07-14 (×8): qty 100

## 2022-07-14 MED ORDER — VITAMIN D 25 MCG (1000 UNIT) PO TABS: 1000.0000 [IU] | ORAL_TABLET | Freq: Every day | ORAL | Status: DC

## 2022-07-14 MED ORDER — METOPROLOL SUCCINATE ER 50 MG PO TB24
50.0000 mg | ORAL_TABLET | Freq: Two times a day (BID) | ORAL | Status: DC
  Administered 2022-07-14 – 2022-07-17 (×7): 50 mg via ORAL
  Filled 2022-07-14 (×7): qty 1

## 2022-07-14 MED ORDER — FENTANYL CITRATE PF 50 MCG/ML IJ SOSY
50.0000 ug | PREFILLED_SYRINGE | Freq: Once | INTRAMUSCULAR | Status: AC
  Administered 2022-07-14: 50 ug via INTRAVENOUS
  Filled 2022-07-14: qty 1

## 2022-07-14 MED ORDER — TRAZODONE HCL 50 MG PO TABS: 50.0000 mg | ORAL_TABLET | Freq: Every evening | ORAL | Status: DC | PRN

## 2022-07-14 MED ORDER — TIZANIDINE HCL 2 MG PO TABS: 2.0000 mg | ORAL_TABLET | Freq: Three times a day (TID) | ORAL | Status: DC | PRN

## 2022-07-14 MED ORDER — ATORVASTATIN CALCIUM 20 MG PO TABS
20.0000 mg | ORAL_TABLET | Freq: Every day | ORAL | Status: DC
  Administered 2022-07-14 – 2022-07-17 (×4): 20 mg via ORAL
  Filled 2022-07-14 (×4): qty 1

## 2022-07-14 MED ORDER — SODIUM CHLORIDE 0.9 % IV SOLN
2.0000 g | INTRAVENOUS | Status: DC
  Administered 2022-07-14 – 2022-07-17 (×4): 2 g via INTRAVENOUS
  Filled 2022-07-14 (×2): qty 20
  Filled 2022-07-14: qty 2
  Filled 2022-07-14: qty 20

## 2022-07-14 MED ORDER — CRANBERRY 450 MG PO TABS: 450.0000 mg | ORAL_TABLET | Freq: Every day | ORAL | Status: DC

## 2022-07-14 MED ORDER — MIRABEGRON ER 25 MG PO TB24
25.0000 mg | ORAL_TABLET | Freq: Every day | ORAL | Status: DC
  Administered 2022-07-14 – 2022-07-16 (×3): 25 mg via ORAL
  Filled 2022-07-14 (×6): qty 1

## 2022-07-14 MED ORDER — PROPOFOL 10 MG/ML IV BOLUS
INTRAVENOUS | Status: DC | PRN
  Administered 2022-07-14: 30 mg via INTRAVENOUS

## 2022-07-14 MED ORDER — OXYCODONE-ACETAMINOPHEN 5-325 MG PO TABS
1.0000 | ORAL_TABLET | ORAL | Status: DC | PRN
  Administered 2022-07-14 – 2022-07-16 (×6): 1 via ORAL
  Filled 2022-07-14 (×6): qty 1

## 2022-07-14 MED ORDER — LISINOPRIL 10 MG PO TABS
10.0000 mg | ORAL_TABLET | Freq: Every day | ORAL | Status: DC
  Administered 2022-07-14 – 2022-07-17 (×4): 10 mg via ORAL
  Filled 2022-07-14 (×4): qty 1

## 2022-07-14 MED ORDER — HYDRALAZINE HCL 20 MG/ML IJ SOLN: 5.0000 mg | INTRAMUSCULAR | Status: DC | PRN

## 2022-07-14 MED ORDER — VITAMIN D 25 MCG (1000 UNIT) PO TABS
1000.0000 [IU] | ORAL_TABLET | Freq: Every day | ORAL | Status: DC
  Administered 2022-07-14 – 2022-07-16 (×3): 1000 [IU] via ORAL
  Filled 2022-07-14 (×3): qty 1

## 2022-07-14 MED ORDER — ONDANSETRON HCL 4 MG PO TABS: 4.0000 mg | ORAL_TABLET | Freq: Four times a day (QID) | ORAL | Status: DC | PRN

## 2022-07-14 MED ORDER — ACETAMINOPHEN 650 MG RE SUPP: 650.0000 mg | Freq: Four times a day (QID) | RECTAL | Status: DC | PRN

## 2022-07-14 MED ORDER — ONDANSETRON HCL 4 MG/2ML IJ SOLN
4.0000 mg | Freq: Four times a day (QID) | INTRAMUSCULAR | Status: DC | PRN
  Administered 2022-07-14 (×2): 4 mg via INTRAVENOUS
  Filled 2022-07-14 (×2): qty 2

## 2022-07-14 MED ORDER — MORPHINE SULFATE (PF) 2 MG/ML IV SOLN
2.0000 mg | INTRAVENOUS | Status: DC | PRN
  Administered 2022-07-14: 2 mg via INTRAVENOUS
  Filled 2022-07-14: qty 1

## 2022-07-14 MED ORDER — SODIUM CHLORIDE 0.9 % IV SOLN
6.2500 mg | Freq: Three times a day (TID) | INTRAVENOUS | Status: DC | PRN
  Filled 2022-07-14: qty 0.25

## 2022-07-14 MED ORDER — ENOXAPARIN SODIUM 40 MG/0.4ML IJ SOSY: 40.0000 mg | PREFILLED_SYRINGE | INTRAMUSCULAR | Status: DC

## 2022-07-14 MED ORDER — LACTATED RINGERS IV BOLUS
1000.0000 mL | Freq: Once | INTRAVENOUS | Status: AC
  Administered 2022-07-14: 1000 mL via INTRAVENOUS

## 2022-07-14 MED ORDER — CLOBETASOL PROPIONATE 0.05 % EX CREA: 1.0000 | TOPICAL_CREAM | Freq: Every day | CUTANEOUS | Status: DC | PRN

## 2022-07-14 MED ORDER — INSULIN ASPART 100 UNIT/ML IJ SOLN: 0.0000 [IU] | Freq: Every day | INTRAMUSCULAR | Status: DC

## 2022-07-14 MED ORDER — GABAPENTIN 100 MG PO CAPS
100.0000 mg | ORAL_CAPSULE | Freq: Every day | ORAL | Status: DC
  Administered 2022-07-14 – 2022-07-16 (×3): 100 mg via ORAL
  Filled 2022-07-14 (×3): qty 1

## 2022-07-14 MED ORDER — PROPOFOL 500 MG/50ML IV EMUL
INTRAVENOUS | Status: DC | PRN
  Administered 2022-07-14: 150 ug/kg/min via INTRAVENOUS

## 2022-07-14 MED ORDER — ACETAMINOPHEN 325 MG PO TABS: 650.0000 mg | ORAL_TABLET | Freq: Four times a day (QID) | ORAL | Status: DC | PRN

## 2022-07-14 MED ORDER — ONDANSETRON HCL 4 MG/2ML IJ SOLN
4.0000 mg | Freq: Once | INTRAMUSCULAR | Status: AC
  Administered 2022-07-14: 4 mg via INTRAVENOUS
  Filled 2022-07-14: qty 2

## 2022-07-14 MED ORDER — IOHEXOL 300 MG/ML  SOLN
100.0000 mL | Freq: Once | INTRAMUSCULAR | Status: AC | PRN
  Administered 2022-07-14: 100 mL via INTRAVENOUS

## 2022-07-14 MED ORDER — SODIUM CHLORIDE 0.9 % IV SOLN: INTRAVENOUS | Status: DC

## 2022-07-14 NOTE — Consult Note (Signed)
Consultation  Referring Provider:    Dr Blaine Hamper  Admit date 07/14/22 Consult date   07/14/22      Reason for Consultation:     Colitis         HPI:   Patricia Lucas is a 82 y.o. female with medical history significant of HTN, HLD, GERD, CKD-3a,  who presented yesterday with one day history of lower abdominal pain, rectal bleeding, NV- had CT A/P demonstrating left sided colitis changes and some cirrhotic liver changes, subsequently admitted with mild leukocytosis, hgb 11.6. platelets normal. Liver enzymes and albumin normal. Pt/inr normal. Cdiff test pending. States she was feeling generally well last night- had spaghetti for dinner with some meat sauce- states about 8pm she started having bad lower bilateral abdominal cramping/urge to defecate- this increased while she was on commode and she felt very cold and sweaty/clammy at that time- lasted about an hour. Had a large loose stool and felt better. At some point later had some bright red liquidy bowel movements- last was about 2am today. She has had some associated nausea and vomiting- describes the emesis as watery and nonbloody. States her pain has improved and is significantly less at this point.there is still some mild nausea but also better. Not on any  anticoagulation and denies nsaids. +DM. Denies any prior cardiovascular history (ie cva/mi/peripheral vascular disease). No known problems with sedated procedures. Denies any family history of colorectal cancer, IBD. Sister had colon resection due to diverticulitis and episode of ischemic colitis in past. Has psoriatic arthritis on infliximab- last dose 07/06/22 (sees Dr Winfield Rast)  PREVIOUS ENDOSCOPIES:            Colonoscopy 2015- Dr Tiffany Kocher- 2 small adenomas   Past Medical History:  Diagnosis Date   Chronic cystitis    Chronic kidney disease    enhanced nsaid use (for psoriatic arthritis) caused renal disease.   Colon polyp    Complication of anesthesia 2017   vomitting with waking up after  colonoscopy   Diabetes mellitus without complication (HCC)    Generalized OA    GERD (gastroesophageal reflux disease)    Osteoporosis    Psoriasis    Psoriatic arthritis ([place])    UTI (lower urinary tract infection)     Past Surgical History:  Procedure Laterality Date   ABDOMINAL HYSTERECTOMY  1969   APPENDECTOMY     BLADDER SUSPENSION  2010   CATARACT EXTRACTION, BILATERAL     fibula fract     OVARIAN CYST REMOVAL  2015   SHOULDER ARTHROSCOPY WITH SUBACROMIAL DECOMPRESSION AND BICEP TENDON REPAIR Left 08/15/2018   Procedure: SHOULDER ARTHROSCOPY WITH DEBRIDEMENT, DECOMPRESSION, ROTATOR CUFF REPAIR AND BICEP TENODESIS-LEFT;  Surgeon: Corky Mull, MD;  Location: [facility];  Service: Orthopedics;  Laterality: Left;    Family History  Problem Relation Age of Onset   Breast cancer Mother 31   Breast cancer Sister    Breast cancer Maternal Aunt 60   Bladder Cancer Neg Hx    Kidney cancer Neg Hx    Prostate cancer Neg Hx      Social History   Tobacco Use   Smoking status: Former    Packs/day: 1.00    Types: Cigarettes    Quit date: 1999    Years since quitting: 25.0   Smokeless tobacco: Never  Vaping Use   Vaping Use: Never used  Substance Use Topics   Alcohol use: No    Alcohol/week: 0.0 standard drinks of alcohol   Drug use:  No    Prior to Admission medications   Medication Sig Start Date End Date Taking? Authorizing Provider  atorvastatin (LIPITOR) 20 MG tablet Take 20 mg by mouth daily. 04/12/21  Yes [provider]  Biotin w/ Vitamins C & E (HAIR/SKIN/NAILS PO) Take 1 tablet by mouth daily.   Yes [provider]  Cholecalciferol (VITAMIN D3) 1000 UNITS CAPS Take 1,000 Units by mouth at bedtime.   Yes [provider]  Cranberry 450 MG TABS Take 450 mg by mouth daily.    Yes [provider]  estradiol (ESTRACE) 0.1 MG/GM vaginal cream Place 1 Applicatorful vaginally 2 (two) times a week.    Yes [provider]   fluticasone (FLONASE) 50 MCG/ACT nasal spray Place 2 sprays into both nostrils daily. 05/15/22 05/15/23 Yes [provider]  gabapentin (NEURONTIN) 100 MG capsule Take 100 mg by mouth at bedtime. 03/30/21  Yes [provider]  inFLIXimab (REMICADE) 100 MG injection Inject 400 mg into the muscle every 8 (eight) weeks. Next dose due 08/20/2018 05/13/12  Yes [provider]  lisinopril (ZESTRIL) 10 MG tablet Take 10 mg by mouth daily. 03/30/21  Yes [provider]  MAGNESIUM OXIDE PO Take 500 mg by mouth daily.    Yes [provider]  metFORMIN (GLUCOPHAGE) 500 MG tablet Take 500 mg by mouth 2 (two) times daily.   Yes [provider]  metoprolol succinate (TOPROL-XL) 50 MG 24 hr tablet Take 50 mg by mouth 2 (two) times daily. 02/23/21  Yes [provider]  mirabegron ER (MYRBETRIQ) 25 MG TB24 tablet Take 1 tablet (25 mg total) by mouth daily. 06/05/22  Yes McGowan, Carollee Herter A, PA-C  nitrofurantoin (MACRODANTIN) 100 MG capsule Take 100 mg by mouth at bedtime. 07/04/22 12/31/22 Yes [provider]  Omega-3 Fatty Acids (OMEGA 3 PO) Take 1 capsule by mouth 2 (two) times daily.    Yes [provider]  clobetasol cream (TEMOVATE) 0.05 % Apply 1 application topically daily as needed (inflammation).    [provider]  Menthol, Topical Analgesic, (BIOFREEZE EX) Apply 1 application topically daily as needed (joint pain).    [provider]  tiZANidine (ZANAFLEX) 4 MG tablet Take 2 mg by mouth every 8 (eight) hours as needed. 03/31/21   [provider]  traZODone (DESYREL) 50 MG tablet Take 50 mg by mouth at bedtime as needed for sleep.  05/06/18   [provider]    Current Facility-Administered Medications  Medication Dose Route Frequency Provider Last Rate Last Admin   0.9 %  sodium chloride infusion   Intravenous Continuous Mansy, Jan A, MD 100 mL/hr at 07/14/22 0638 New Bag at 07/14/22 0350    acetaminophen (TYLENOL) tablet 650 mg  650 mg Oral Q6H PRN Mansy, Jan A, MD       Or   acetaminophen (TYLENOL) suppository 650 mg  650 mg Rectal Q6H PRN Mansy, Jan A, MD       atorvastatin (LIPITOR) tablet 20 mg  20 mg Oral Daily Mansy, Jan A, MD   20 mg at 07/14/22 0929   cefTRIAXone (ROCEPHIN) 2 g in sodium chloride 0.9 % 100 mL IVPB  2 g Intravenous Q24H Mansy, Jan A, MD   Stopped at 07/14/22 0720   cholecalciferol (VITAMIN D3) 25 MCG (1000 UNIT) tablet 1,000 Units  1,000 Units Oral QHS Belue, Lendon Collar, RPH       clobetasol cream (TEMOVATE) 0.05 % 1 Application  1 Application Topical Daily PRN Mansy,  Arvella Merles, MD       [START ON 07/17/2022] estradiol (ESTRACE) vaginal cream 1 Applicatorful  1 Applicatorful Vaginal Once per day on Mon Thu Mansy, Jan A, MD       fluticasone [facility]) 50 MCG/ACT nasal spray 2 spray  2 spray Each Nare Daily Mansy, Jan A, MD       gabapentin (NEURONTIN) capsule 100 mg  100 mg Oral QHS Mansy, Jan A, MD       hydrALAZINE (APRESOLINE) injection 5 mg  5 mg Intravenous Q2H PRN Ivor Costa, MD       insulin aspart (novoLOG) injection 0-5 Units  0-5 Units Subcutaneous QHS Ivor Costa, MD       insulin aspart (novoLOG) injection 0-9 Units  0-9 Units Subcutaneous TID WC Ivor Costa, MD   2 Units at 07/14/22 0932   lisinopril (ZESTRIL) tablet 10 mg  10 mg Oral Daily Mansy, Jan A, MD   10 mg at 07/14/22 0929   metoprolol succinate (TOPROL-XL) 24 hr tablet 50 mg  50 mg Oral BID Mansy, Jan A, MD   50 mg at 07/14/22 0931   metroNIDAZOLE (FLAGYL) IVPB 500 mg  500 mg Intravenous Q12H Mansy, Jan A, MD   Stopped at 07/14/22 0932   mirabegron ER (MYRBETRIQ) tablet 25 mg  25 mg Oral Daily Mansy, Jan A, MD       morphine (PF) 2 MG/ML injection 1 mg  1 mg Intravenous Q4H PRN Ivor Costa, MD       omega-3 acid ethyl esters (LOVAZA) capsule 1,000 mg  1,000 mg Oral BID Mansy, Jan A, MD   1,000 mg at 07/14/22 1105   ondansetron (ZOFRAN) tablet 4 mg  4 mg Oral Q6H PRN Mansy, Jan A, MD       Or    ondansetron [facility]) injection 4 mg  4 mg Intravenous Q6H PRN Mansy, Jan A, MD   4 mg at 07/14/22 [id number]   oxyCODONE-acetaminophen (PERCOCET/ROXICET) 5-325 MG per tablet 1 tablet  1 tablet Oral Q4H PRN Ivor Costa, MD   1 tablet at 07/14/22 0929   tiZANidine (ZANAFLEX) tablet 2 mg  2 mg Oral Q8H PRN Mansy, Jan A, MD       traZODone (DESYREL) tablet 50 mg  50 mg Oral QHS PRN Mansy, Arvella Merles, MD       Current Outpatient Medications  Medication Sig Dispense Refill   atorvastatin (LIPITOR) 20 MG tablet Take 20 mg by mouth daily.     Biotin w/ Vitamins C & E (HAIR/SKIN/NAILS PO) Take 1 tablet by mouth daily.     Cholecalciferol (VITAMIN D3) 1000 UNITS CAPS Take 1,000 Units by mouth at bedtime.     Cranberry 450 MG TABS Take 450 mg by mouth daily.      estradiol (ESTRACE) 0.1 MG/GM vaginal cream Place 1 Applicatorful vaginally 2 (two) times a week.      fluticasone (FLONASE) 50 MCG/ACT nasal spray Place 2 sprays into both nostrils daily.     gabapentin (NEURONTIN) 100 MG capsule Take 100 mg by mouth at bedtime.     inFLIXimab (REMICADE) 100 MG injection Inject 400 mg into the muscle every 8 (eight) weeks. Next dose due 08/20/2018     lisinopril (ZESTRIL) 10 MG tablet Take 10 mg by mouth daily.     MAGNESIUM OXIDE PO Take 500 mg by mouth daily.      metFORMIN (GLUCOPHAGE) 500 MG tablet Take 500 mg by mouth 2 (two) times daily.  metoprolol succinate (TOPROL-XL) 50 MG 24 hr tablet Take 50 mg by mouth 2 (two) times daily.     mirabegron ER (MYRBETRIQ) 25 MG TB24 tablet Take 1 tablet (25 mg total) by mouth daily. 42 tablet 0   nitrofurantoin (MACRODANTIN) 100 MG capsule Take 100 mg by mouth at bedtime.     Omega-3 Fatty Acids (OMEGA 3 PO) Take 1 capsule by mouth 2 (two) times daily.      clobetasol cream (TEMOVATE) 7.25 % Apply 1 application topically daily as needed (inflammation).     Menthol, Topical Analgesic, (BIOFREEZE EX) Apply 1 application topically daily as needed (joint pain).     tiZANidine  (ZANAFLEX) 4 MG tablet Take 2 mg by mouth every 8 (eight) hours as needed.     traZODone (DESYREL) 50 MG tablet Take 50 mg by mouth at bedtime as needed for sleep.       Allergies as of 07/13/2022 - Review Complete 07/13/2022  Allergen Reaction Noted   Nsaids  08/08/2018     Review of Systems:    All systems reviewed and negative except where noted in HPI.    Physical Exam:  Vital signs in last 24 hours: Temp:  [97.9 F (36.6 C)-98.6 F (37 C)] 97.9 F (36.6 C) (01/12 1113) Pulse Rate:  [76-108] 96 (01/12 1113) Resp:  [16-25] 16 (01/12 1113) BP: (154-198)/(82-106) 154/82 (01/12 1113) SpO2:  [94 %-100 %] 100 % (01/12 1113) Weight:  [65.8 kg] 65.8 kg (01/11 2348)   General:   Pleasant elderly woman in NAD Head:  Normocephalic and atraumatic. Eyes:   No icterus.   Conjunctiva pink. Ears:  Normal auditory acuity. Mouth: Mucosa pink moist, no lesions. Neck:  Supple; no masses felt Lungs:  Respirations even and unlabored. Lungs clear to auscultation bilaterally.   No wheezes, crackles, or rhonchi.  Heart:  S1S2, RRR, no MRG. No edema. Abdomen:   Flat, soft, nondistended, nontender. Normal bowel sounds. No appreciable masses or hepatomegaly. No rebound signs or other peritoneal signs. Rectal:  Not performed.  Msk:  MAEW x4, No clubbing or cyanosis. Strength 5/5. Symmetrical without gross deformities. Neurologic:  Alert and  oriented x4;  Cranial nerves II-XII intact.  Skin:  Warm, dry, pink without significant lesions or rashes. Psych:  Alert and cooperative. Normal affect.  LAB RESULTS: Recent Labs    07/13/22 2356 07/14/22 0956  WBC 12.9* 16.8*  HGB 11.6* 11.6*  HCT 36.0 35.7*  PLT 243 193   BMET Recent Labs    07/13/22 2356  NA 134*  K 4.6  CL 100  CO2 22  GLUCOSE 128*  BUN 23  CREATININE 1.10*  CALCIUM 9.5   LFT Recent Labs    07/13/22 2356  PROT 8.4*  ALBUMIN 4.5  AST 33  ALT 22  ALKPHOS 54  BILITOT 0.5   PT/INR Recent Labs    07/14/22 0956   LABPROT 14.0  INR 1.1    STUDIES: CT Abdomen Pelvis W Contrast  Result Date: 07/14/2022 CLINICAL DATA:  Abdominal pain, acute, nonlocalized, lower abdominal pain, rectal bleeding EXAM: CT ABDOMEN AND PELVIS WITH CONTRAST TECHNIQUE: Multidetector CT imaging of the abdomen and pelvis was performed using the standard protocol following bolus administration of intravenous contrast. RADIATION DOSE REDUCTION: This exam was performed according to the departmental dose-optimization program which includes automated exposure control, adjustment of the mA and/or kV according to patient size and/or use of iterative reconstruction technique. CONTRAST:  176mL OMNIPAQUE IOHEXOL 300 MG/ML  SOLN COMPARISON:  11/02/2009  FINDINGS: Lower chest: Emphysema.  No acute abnormality. Hepatobiliary: Nodular contour of the liver in keeping with cirrhosis. No enhancing intrahepatic mass. No intra or extrahepatic biliary ductal dilation. Gallbladder unremarkable. Pancreas: Unremarkable Spleen: Unremarkable Adrenals/Urinary Tract: The adrenal glands are unremarkable. The kidneys are normal in size and position. Multiple simple cortical cysts are seen within the kidneys bilaterally. No follow-up imaging is recommended for these lesions. The kidneys are otherwise unremarkable. The bladder is unremarkable. Stomach/Bowel: There is severe circumferential wall thickening, submucosal edema, and pericolonic inflammatory stranding involving the descending colon with milder inflammatory changes also involving the proximal and mid sigmoid colon in keeping with a long segment infectious, inflammatory, or ischemic colitis. The marginal artery and vein appear patent, however. No evidence of obstruction or perforation. No free intraperitoneal gas or fluid. The stomach, small bowel, and large bowel are otherwise unremarkable. The appendix is absent. Vascular/Lymphatic: Moderate infrarenal aortic and iliac atherosclerotic calcification. No aortic  aneurysm. No pathologic adenopathy within the abdomen and pelvis. Reproductive: Status post hysterectomy. No adnexal masses. Other: No abdominal wall hernia or abnormality. No abdominopelvic ascites. Musculoskeletal: Osseous structures are age-appropriate. No acute bone abnormality. No lytic or blastic bone lesion. IMPRESSION: 1. Long segment infectious, inflammatory, or ischemic colitis involving the descending colon and proximal and mid sigmoid colon. No evidence of obstruction or perforation. 2. Morphologic changes in keeping with cirrhosis. No enhancing intrahepatic mass. Electronically Signed   By: Fidela Salisbury M.D.   On: 07/14/2022 02:23       Impression / Plan:   Lower abdominal pain, rectal bleeding, abnormal CT colon- broad ddx but also concerning for ischemia- less likely inflammatory- recommending flexible sigmoidoscopy for luminal eval. Had 3 bites jello at 11am. Procedure, indication, benefits, risks discussed with her and her sister- as well as DNR implications during procedure and they are agreeable. Planning for later today  Thank you very much for this consult. These services were provided by Stephens November, NP-C, in collaboration with Lesly Rubenstein MD, with whom I have discussed this patient in full.   Stephens November, NP-C

## 2022-07-14 NOTE — ED Provider Notes (Signed)
[facility] Provider Note    Event Date/Time   First MD Initiated Contact with Patient 07/14/22 [id number]     (approximate)   History   Rectal Bleeding   HPI  Patricia Lucas is a 82 y.o. female who presents to the ED for evaluation of Rectal Bleeding   I reviewed urology clinic visit from 12/4.  History of chronic cystitis, psoriatic arthritis on infliximab, HTN, HLD, DM.  No documented AC or antiplatelets. Most recent CBC comparison I can find is from 04/10/2022 with a hemoglobin of 10.4.  Patient presents to the ED for evaluation of lower abdominal pain and hematochezia. Just for the past few hours since last night. No fevers, dysuria or emesis.    Physical Exam   Triage Vital Signs: ED Triage Vitals [07/13/22 2348]  Enc Vitals Group     BP (!) 198/95     Pulse Rate 76     Resp 20     Temp 98.6 F (37 C)     Temp Source Oral     SpO2 94 %     Weight 145 lb (65.8 kg)     Height 5\' 2"  (1.575 m)     Head Circumference      Peak Flow      Pain Score 8     Pain Loc      Pain Edu?      Excl. in GC?     Most recent vital signs: Vitals:   07/13/22 2348 07/14/22 0232  BP: (!) 198/95 (!) 190/97  Pulse: 76 91  Resp: 20 18  Temp: 98.6 F (37 C) 98.5 F (36.9 C)  SpO2: 94% 100%    General: Awake, no distress. Uncomfortable. CV:  Good peripheral perfusion.  Resp:  Normal effort.  Abd:  No distention. Lower abdominal TTP, particularly to the LLQ, but no peritoneal features. Benign upper abd.  MSK:  No deformity noted.  Neuro:  No focal deficits appreciated. Other:     ED Results / Procedures / Treatments   Labs (all labs ordered are listed, but only abnormal results are displayed) Labs Reviewed  COMPREHENSIVE METABOLIC PANEL - Abnormal; Notable for the following components:      Result Value   Sodium 134 (*)    Glucose, Bld 128 (*)    Creatinine, Ser 1.10 (*)    Total Protein 8.4 (*)    GFR, Estimated 50 (*)    All other  components within normal limits  CBC - Abnormal; Notable for the following components:   WBC 12.9 (*)    RBC 3.81 (*)    Hemoglobin 11.6 (*)    All other components within normal limits  URINALYSIS, ROUTINE W REFLEX MICROSCOPIC - Abnormal; Notable for the following components:   Color, Urine STRAW (*)    APPearance CLEAR (*)    Ketones, ur 5 (*)    Protein, ur 30 (*)    All other components within normal limits  POC OCCULT BLOOD, ED  TYPE AND SCREEN    EKG   RADIOLOGY Ct abd/pelv interpreted by me with signs of colitis, but no SBO  Official radiology report(s): CT Abdomen Pelvis W Contrast  Result Date: 07/14/2022 CLINICAL DATA:  Abdominal pain, acute, nonlocalized, lower abdominal pain, rectal bleeding EXAM: CT ABDOMEN AND PELVIS WITH CONTRAST TECHNIQUE: Multidetector CT imaging of the abdomen and pelvis was performed using the standard protocol following bolus administration of intravenous contrast. RADIATION DOSE REDUCTION: This exam was performed  according to the departmental dose-optimization program which includes automated exposure control, adjustment of the mA and/or kV according to patient size and/or use of iterative reconstruction technique. CONTRAST:  135mL OMNIPAQUE IOHEXOL 300 MG/ML  SOLN COMPARISON:  11/02/2009 FINDINGS: Lower chest: Emphysema.  No acute abnormality. Hepatobiliary: Nodular contour of the liver in keeping with cirrhosis. No enhancing intrahepatic mass. No intra or extrahepatic biliary ductal dilation. Gallbladder unremarkable. Pancreas: Unremarkable Spleen: Unremarkable Adrenals/Urinary Tract: The adrenal glands are unremarkable. The kidneys are normal in size and position. Multiple simple cortical cysts are seen within the kidneys bilaterally. No follow-up imaging is recommended for these lesions. The kidneys are otherwise unremarkable. The bladder is unremarkable. Stomach/Bowel: There is severe circumferential wall thickening, submucosal edema, and  pericolonic inflammatory stranding involving the descending colon with milder inflammatory changes also involving the proximal and mid sigmoid colon in keeping with a long segment infectious, inflammatory, or ischemic colitis. The marginal artery and vein appear patent, however. No evidence of obstruction or perforation. No free intraperitoneal gas or fluid. The stomach, small bowel, and large bowel are otherwise unremarkable. The appendix is absent. Vascular/Lymphatic: Moderate infrarenal aortic and iliac atherosclerotic calcification. No aortic aneurysm. No pathologic adenopathy within the abdomen and pelvis. Reproductive: Status post hysterectomy. No adnexal masses. Other: No abdominal wall hernia or abnormality. No abdominopelvic ascites. Musculoskeletal: Osseous structures are age-appropriate. No acute bone abnormality. No lytic or blastic bone lesion. IMPRESSION: 1. Long segment infectious, inflammatory, or ischemic colitis involving the descending colon and proximal and mid sigmoid colon. No evidence of obstruction or perforation. 2. Morphologic changes in keeping with cirrhosis. No enhancing intrahepatic mass. Electronically Signed   By: Fidela Salisbury M.D.   On: 07/14/2022 02:23    PROCEDURES and INTERVENTIONS:  Procedures  Medications  fentaNYL (SUBLIMAZE) injection 50 mcg (has no administration in time range)  iohexol (OMNIPAQUE) 300 MG/ML solution 100 mL (100 mLs Intravenous Contrast Given 07/14/22 0100)  lactated ringers bolus 1,000 mL (1,000 mLs Intravenous New Bag/Given 07/14/22 0243)  fentaNYL (SUBLIMAZE) injection 50 mcg (50 mcg Intravenous Given 07/14/22 0243)  ondansetron (ZOFRAN) injection 4 mg (4 mg Intravenous Given 07/14/22 0242)     IMPRESSION / MDM / ASSESSMENT AND PLAN / ED COURSE  I reviewed the triage vital signs and the nursing notes.  Differential diagnosis includes, but is not limited to, diverticulitis, colitis, ischemic colitis, sepsis, vaginal bleeding or cystitis,  blood loss anemia  {Patient presents with symptoms of an acute illness or injury that is potentially life-threatening.  82 year old female on Remicade presents to the ED from home with lower abdominal pain and hematochezia, with evidence of colitis of uncertain etiology and requiring medical admission.  Blood work with stable hemoglobin.  Leukocytosis is noted.  Creatinine slightly higher, but largely around baseline.  Urine with ketones suggestive of dehydration, but no infectious features.  CT with evidence of colitis.  Less likely ischemic, infectious is certainly a possibility considering her being on infliximab.  Due to the continued symptomatic nature of her symptoms, we will consult medicine for admission  Clinical Course as of 07/14/22 0426  Fri Jul 14, 2022  0343 Reassessed.  On the toilet.  Reports pain is worsened again.  She is passing a bowel movement.  Shows me the tissue paper as she is wiping, red blood is noted. [DS]    Clinical Course User Index [DS] Vladimir Crofts, MD     FINAL CLINICAL IMPRESSION(S) / ED DIAGNOSES   Final diagnoses:  Colitis  Lower GI bleed  Lower abdominal pain     Rx / DC Orders   ED Discharge Orders     None        Note:  This document was prepared using Dragon voice recognition software and may include unintentional dictation errors.   Vladimir Crofts, MD 07/14/22 0430

## 2022-07-14 NOTE — Anesthesia Procedure Notes (Signed)
Date/Time: 07/14/2022 3:13 PM  Performed by: Nelda Marseille, CRNAPre-anesthesia Checklist: Patient identified, Emergency Drugs available, Suction available, Patient being monitored and Timeout performed Oxygen Delivery Method: Nasal cannula

## 2022-07-14 NOTE — Anesthesia Preprocedure Evaluation (Signed)
Anesthesia Evaluation  Patient identified by MRN, date of birth, ID band Patient awake    Reviewed: Allergy & Precautions, NPO status , Patient's Chart, lab work & pertinent test results  History of Anesthesia Complications (+) history of anesthetic complications  Airway Mallampati: III  TM Distance: <3 FB Neck ROM: full    Dental  (+) Chipped   Pulmonary neg shortness of breath, former smoker   Pulmonary exam normal        Cardiovascular Exercise Tolerance: Good hypertension, (-) angina Normal cardiovascular exam     Neuro/Psych negative neurological ROS  negative psych ROS   GI/Hepatic Neg liver ROS,GERD  ,,  Endo/Other  diabetes, Type 2    Renal/GU Renal disease  negative genitourinary   Musculoskeletal   Abdominal   Peds  Hematology negative hematology ROS (+)   Anesthesia Other Findings Past Medical History: No date: Chronic cystitis No date: Chronic kidney disease     Comment:  enhanced nsaid use (for psoriatic arthritis) caused               renal disease. 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 No date: GERD (gastroesophageal reflux disease) No date: Osteoporosis No date: Psoriasis No date: Psoriatic arthritis ([place]) No date: UTI (lower urinary tract infection)  Past Surgical History: 1969: ABDOMINAL HYSTERECTOMY No date: APPENDECTOMY 2010: BLADDER SUSPENSION No date: CATARACT EXTRACTION, BILATERAL No date: fibula fract 2015: OVARIAN CYST REMOVAL 08/15/2018: SHOULDER ARTHROSCOPY WITH SUBACROMIAL DECOMPRESSION AND  BICEP TENDON REPAIR; Left     Comment:  Procedure: SHOULDER ARTHROSCOPY WITH DEBRIDEMENT,               DECOMPRESSION, ROTATOR CUFF REPAIR AND BICEP               TENODESIS-LEFT;  Surgeon: Corky Mull, MD;  Location:                [facility] ORS;  Service: Orthopedics;  Laterality: Left;  BMI    Body Mass Index: 26.52 kg/m      Reproductive/Obstetrics negative OB ROS                             Anesthesia Physical Anesthesia Plan  ASA: 2  Anesthesia Plan: General   Post-op Pain Management:    Induction: Intravenous  PONV Risk Score and Plan: Propofol infusion and TIVA  Airway Management Planned: Natural Airway and Nasal Cannula  Additional Equipment:   Intra-op Plan:   Post-operative Plan:   Informed Consent: I have reviewed the patients History and Physical, chart, labs and discussed the procedure including the risks, benefits and alternatives for the proposed anesthesia with the patient or authorized representative who has indicated his/her understanding and acceptance.   Patient has DNR.  Discussed DNR with patient and Suspend DNR.   Dental Advisory Given  Plan Discussed with: Anesthesiologist, CRNA and Surgeon  Anesthesia Plan Comments: (Patient consented for risks of anesthesia including but not limited to:  - adverse reactions to medications - risk of airway placement if required - damage to eyes, teeth, lips or other oral mucosa - nerve damage due to positioning  - sore throat or hoarseness - Damage to heart, brain, nerves, lungs, other parts of body or loss of life  Patient voiced understanding.)       Anesthesia Quick  Evaluation

## 2022-07-14 NOTE — ED Notes (Signed)
Smith MD aware of pts BP

## 2022-07-14 NOTE — Op Note (Signed)
[facility] Gastroenterology Patient Name: Patricia Lucas Procedure Date: 07/14/2022 2:46 PM MRN: [id number] Account #: [id number] Date of Birth: 06-24-1941 Admit Type: Outpatient Age: 82 Room: [facility] ENDO ROOM 3 Gender: Female Note Status: Finalized Instrument Name: Peds Colonoscope [id number] Procedure:             Flexible Sigmoidoscopy Indications:           Abnormal CT of the GI tract Providers:             Andrey Farmer MD, MD Medicines:             Monitored Anesthesia Care Complications:         No immediate complications. Estimated blood loss:                         Minimal. Procedure:             Pre-Anesthesia Assessment:                        - Prior to the procedure, a History and Physical was                         performed, and patient medications and allergies were                         reviewed. The patient is competent. The risks and                         benefits of the procedure and the sedation options and                         risks were discussed with the patient. All questions                         were answered and informed consent was obtained.                         Patient identification and proposed procedure were                         verified by the physician, the nurse, the                         anesthesiologist, the anesthetist and the technician                         in the endoscopy suite. Mental Status Examination:                         alert and oriented. Airway Examination: normal                         oropharyngeal airway and neck mobility. Respiratory                         Examination: clear to auscultation. CV Examination:                         normal. Prophylactic Antibiotics: The patient does not  require prophylactic antibiotics. Prior                         Anticoagulants: The patient has taken no anticoagulant                         or antiplatelet agents. ASA Grade  Assessment: III - A                         patient with severe systemic disease. After reviewing                         the risks and benefits, the patient was deemed in                         satisfactory condition to undergo the procedure. The                         anesthesia plan was to use monitored anesthesia care                         (MAC). Immediately prior to administration of                         medications, the patient was re-assessed for adequacy                         to receive sedatives. The heart rate, respiratory                         rate, oxygen saturations, blood pressure, adequacy of                         pulmonary ventilation, and response to care were                         monitored throughout the procedure. The physical                         status of the patient was re-assessed after the                         procedure.                        After obtaining informed consent, the scope was passed                         under direct vision. The Colonoscope was introduced                         through the anus and advanced to the the descending                         colon. The flexible sigmoidoscopy was accomplished                         without difficulty. The patient tolerated the  procedure well. The quality of the bowel preparation                         was inadequate. Findings:      The perianal and digital rectal examinations were normal.      Clotted blood was found in the recto-sigmoid colon and in the sigmoid       colon.      An area of mildly congested mucosa was found in the recto-sigmoid colon,       in the sigmoid colon and in the descending colon. Biopsies were taken       with a cold forceps for histology. Estimated blood loss was minimal. The       mucosa appeared mostly normal besides the clotted blood throughout the       sigmoid colon that was adhering to the mucosal wall which is suggestive        of ischemic colitis. Also, the abrupt transition from abnormal to normal       mucosa was prominent.      Normal mucosa was found in the proximal descending colon. There appeared       to be an abrupt transition from abnormal to normal mucosa. Impression:            - Preparation of the colon was inadequate.                        - Blood in the recto-sigmoid colon and in the sigmoid                         colon.                        - Congested mucosa in the recto-sigmoid colon, in the                         sigmoid colon and in the descending colon. Biopsied.                        - Normal mucosa in the proximal descending colon. Recommendation:        - Return patient to hospital ward for ongoing care.                        - Advance diet as tolerated.                        - Continue present medications.                        - Await pathology results. Procedure Code(s):     --- Professional ---                        [phone], Sigmoidoscopy, flexible; with biopsy, single or                         multiple Diagnosis Code(s):     --- Professional ---                        K92.2, Gastrointestinal hemorrhage, unspecified  K63.89, Other specified diseases of intestine                        R93.3, Abnormal findings on diagnostic imaging of                         other parts of digestive tract CPT copyright 2022 [employer]. All rights reserved. The codes documented in this report are preliminary and upon coder review may  be revised to meet current compliance requirements. Andrey Farmer MD, MD 07/14/2022 3:30:26 PM Number of Addenda: 0 Note Initiated On: 07/14/2022 2:46 PM Total Procedure Duration: 0 hours 8 minutes 0 seconds  Estimated Blood Loss:  Estimated blood loss was minimal.      [facility]

## 2022-07-14 NOTE — Transfer of Care (Signed)
Immediate Anesthesia Transfer of Care Note  Patient: Patricia Lucas  Procedure(s) Performed: FLEXIBLE SIGMOIDOSCOPY  Patient Location: PACU  Anesthesia Type:General  Level of Consciousness: awake, alert , and oriented  Airway & Oxygen Therapy: Patient Spontanous Breathing and Patient connected to nasal cannula oxygen  Post-op Assessment: Report given to RN and Post -op Vital signs reviewed and stable  Post vital signs: Reviewed and stable  Last Vitals:  Vitals Value Taken Time  BP    Temp    Pulse    Resp 23 07/14/22 1526  SpO2    Vitals shown include unvalidated device data.  Last Pain:  Vitals:   07/14/22 1456  TempSrc: Temporal  PainSc: 0-No pain         Complications: No notable events documented.

## 2022-07-14 NOTE — H&P (Signed)
History and Physical    Patricia Lucas WCB:[id number] DOB: [birth year] DOA: 07/14/2022  Referring MD/NP/PA:   PCP: Juluis Pitch, MD   Patient coming from:  The patient is coming from home.  At baseline, pt is independent for most of ADL.        Chief Complaint:   HPI: Patricia Lucas is a 82 y.o. female with medical history significant of HTN, HLD, GERD, CKD-3a, who presents with nausea, vomiting, abdominal pain, bloody stool.  Patient states that her symptoms started yesterday, including nausea vomiting, abdominal pain, bloody stool.  She has vomited several times with nonbilious nonbloody vomiting, mostly dry heaves.  Her abdominal pain is located in the lower abdomen, which is constant, severe, sharp, nonradiating.  Patient initially had dark stool bowel movement, then had 4-5 times of bloody stool bowel movement with bright red blood.  Patient has chills, but no fever.  Denies chest pain, cough, shortness breath.  No symptoms of UTI.  Data reviewed independently and ED Course: pt was found to have WBC 12.9, renal function close to baseline, temperature normal, blood pressure 198/95, 177, 91, heart rate 103, RR 23, oxygen saturation 98% on room air (patient had 89% desaturation transiently).  CT abdomen/pelvis showed long segment of acute colitis.  Patient is admitted to telemetry bed as inpatient.  Dr. Haig Prophet of GI is consulted  CT abdomen/pelvis: 1. Long segment infectious, inflammatory, or ischemic colitis involving the descending colon and proximal and mid sigmoid colon. No evidence of obstruction or perforation. 2. Morphologic changes in keeping with cirrhosis. No enhancing intrahepatic mass.   EKG:  Not done in ED, will get one.  Sinus rhythm, QTc 463, LAE, early R wave progression   Review of Systems:   General: no fevers, chills, no body weight gain, has poor appetite, has fatigue HEENT: no blurry vision, hearing changes or sore throat Respiratory: no dyspnea,  coughing, wheezing CV: no chest pain, no palpitations GI: has nausea, vomiting, abdominal pain, bloody stool GU: no dysuria, burning on urination, increased urinary frequency, hematuria  Ext: no leg edema Neuro: no unilateral weakness, numbness, or tingling, no vision change or hearing loss Skin: no rash, no skin tear. MSK: No muscle spasm, no deformity, no limitation of range of movement in spin Heme: No easy bruising.  Travel history: No recent long distant travel.   Allergy:  Allergies  Allergen Reactions   Nsaids     NOT TO TAKE DUE TO RENAL DISEASE    Past Medical History:  Diagnosis Date   Chronic cystitis    Chronic kidney disease    enhanced nsaid use (for psoriatic arthritis) caused renal disease.   Colon polyp    Complication of anesthesia 2017   vomitting with waking up after colonoscopy   Diabetes mellitus without complication (HCC)    Generalized OA    GERD (gastroesophageal reflux disease)    Osteoporosis    Psoriasis    Psoriatic arthritis ([place])    UTI (lower urinary tract infection)     Past Surgical History:  Procedure Laterality Date   ABDOMINAL HYSTERECTOMY  1969   APPENDECTOMY     BLADDER SUSPENSION  2010   CATARACT EXTRACTION, BILATERAL     fibula fract     OVARIAN CYST REMOVAL  2015   SHOULDER ARTHROSCOPY WITH SUBACROMIAL DECOMPRESSION AND BICEP TENDON REPAIR Left 08/15/2018   Procedure: SHOULDER ARTHROSCOPY WITH DEBRIDEMENT, DECOMPRESSION, ROTATOR CUFF REPAIR AND BICEP TENODESIS-LEFT;  Surgeon: Corky Mull, MD;  Location: [facility] ORS;  Service: Orthopedics;  Laterality: Left;    Social History:  reports that she quit smoking about 25 years ago. Her smoking use included cigarettes. She smoked an average of 1 pack per day. She has never used smokeless tobacco. She reports that she does not drink alcohol and does not use drugs.  Family History:  Family History  Problem Relation Age of Onset   Breast cancer Mother 31   Breast cancer Sister     Breast cancer Maternal Aunt 60   Bladder Cancer Neg Hx    Kidney cancer Neg Hx    Prostate cancer Neg Hx      Prior to Admission medications   Medication Sig Start Date End Date Taking? Authorizing Provider  atorvastatin (LIPITOR) 20 MG tablet Take 20 mg by mouth daily. 04/12/21  Yes [provider]  Biotin w/ Vitamins C & E (HAIR/SKIN/NAILS PO) Take 1 tablet by mouth daily.   Yes [provider]  Cholecalciferol (VITAMIN D3) 1000 UNITS CAPS Take 1,000 Units by mouth at bedtime.   Yes [provider]  Cranberry 450 MG TABS Take 450 mg by mouth daily.    Yes [provider]  estradiol (ESTRACE) 0.1 MG/GM vaginal cream Place 1 Applicatorful vaginally 2 (two) times a week.    Yes [provider]  fluticasone (FLONASE) 50 MCG/ACT nasal spray Place 2 sprays into both nostrils daily. 05/15/22 05/15/23 Yes [provider]  gabapentin (NEURONTIN) 100 MG capsule Take 100 mg by mouth at bedtime. 03/30/21  Yes [provider]  inFLIXimab (REMICADE) 100 MG injection Inject 400 mg into the muscle every 8 (eight) weeks. Next dose due 08/20/2018 05/13/12  Yes [provider]  lisinopril (ZESTRIL) 10 MG tablet Take 10 mg by mouth daily. 03/30/21  Yes [provider]  MAGNESIUM OXIDE PO Take 500 mg by mouth daily.    Yes [provider]  metFORMIN (GLUCOPHAGE) 500 MG tablet Take 500 mg by mouth 2 (two) times daily.   Yes [provider]  metoprolol succinate (TOPROL-XL) 50 MG 24 hr tablet Take 50 mg by mouth 2 (two) times daily. 02/23/21  Yes [provider]  mirabegron ER (MYRBETRIQ) 25 MG TB24 tablet Take 1 tablet (25 mg total) by mouth daily. 06/05/22  Yes McGowan, Larene Beach A, PA-C  nitrofurantoin (MACRODANTIN) 100 MG capsule Take 100 mg by mouth at bedtime. 07/04/22 12/31/22 Yes [provider]  Omega-3 Fatty Acids (OMEGA 3 PO) Take 1 capsule by mouth 2 (two) times daily.    Yes [provider]  clobetasol cream (TEMOVATE) 2.13 % Apply 1 application topically daily as needed (inflammation).    [provider]  Menthol, Topical Analgesic, (BIOFREEZE EX) Apply 1 application topically daily as needed (joint pain).    [provider]  tiZANidine (ZANAFLEX) 4 MG tablet Take 2 mg by mouth every 8 (eight) hours as needed. 03/31/21   [provider]  traZODone (DESYREL) 50 MG tablet Take 50 mg by mouth at bedtime as needed for sleep.  05/06/18   [provider]    Physical Exam: Vitals:   07/14/22 1456 07/14/22 1524 07/14/22 1540 07/14/22 1556  BP: (!) 145/68 128/75 126/81 (!) 148/67  Pulse: (!) 112  (!) 45 (!) 109  Resp: 16 (!) 23 (!) 21 (!) 21  Temp: 98.8 F (37.1 C) 99.7 F (37.6 C)    TempSrc: Temporal Temporal    SpO2: 91% 97% (!) 83% 90%  Weight:      Height:  General: Not in acute distress HEENT:       Eyes: PERRL, EOMI, no scleral icterus.       ENT: No discharge from the ears and nose, no pharynx injection, no tonsillar enlargement.        Neck: No JVD, no bruit, no mass felt. Heme: No neck lymph node enlargement. Cardiac: S1/S2, RRR, No murmurs, No gallops or rubs. Respiratory: No rales, wheezing, rhonchi or rubs. GI: Soft, nondistended, has a tenderness in lower abdomen, no rebound pain, no organomegaly, BS present. GU: No hematuria Ext: No pitting leg edema bilaterally. 1+DP/PT pulse bilaterally. Musculoskeletal: No joint deformities, No joint redness or warmth, no limitation of ROM in spin. Skin: No rashes.  Neuro: Alert, oriented X3, cranial nerves II-XII grossly intact, moves all extremities normally.  Psych: Patient is not psychotic, no suicidal or hemocidal ideation.  Labs on Admission: I have personally reviewed following labs and imaging studies  CBC: Recent Labs  Lab 07/13/22 2356 07/14/22 0956  WBC 12.9* 16.8*  HGB 11.6* 11.6*  HCT 36.0 35.7*  MCV 94.5 91.8  PLT 243 0000000   Basic Metabolic Panel: Recent  Labs  Lab 07/13/22 2356  NA 134*  K 4.6  CL 100  CO2 22  GLUCOSE 128*  BUN 23  CREATININE 1.10*  CALCIUM 9.5   GFR: Estimated Creatinine Clearance: 35.7 mL/min (A) (by C-G formula based on SCr of 1.1 mg/dL (H)). Liver Function Tests: Recent Labs  Lab 07/13/22 2356  AST 33  ALT 22  ALKPHOS 54  BILITOT 0.5  PROT 8.4*  ALBUMIN 4.5   No results for input(s): "LIPASE", "AMYLASE" in the last 168 hours. No results for input(s): "AMMONIA" in the last 168 hours. Coagulation Profile: Recent Labs  Lab 07/14/22 0956  INR 1.1   Cardiac Enzymes: No results for input(s): "CKTOTAL", "CKMB", "CKMBINDEX", "TROPONINI" in the last 168 hours. BNP (last 3 results) No results for input(s): "PROBNP" in the last 8760 hours. HbA1C: No results for input(s): "HGBA1C" in the last 72 hours. CBG: Recent Labs  Lab 07/14/22 0837 07/14/22 1225 07/14/22 1718  GLUCAP 153* 116* 134*   Lipid Profile: No results for input(s): "CHOL", "HDL", "LDLCALC", "TRIG", "CHOLHDL", "LDLDIRECT" in the last 72 hours. Thyroid Function Tests: No results for input(s): "TSH", "T4TOTAL", "FREET4", "T3FREE", "THYROIDAB" in the last 72 hours. Anemia Panel: No results for input(s): "VITAMINB12", "FOLATE", "FERRITIN", "TIBC", "IRON", "RETICCTPCT" in the last 72 hours. Urine analysis:    Component Value Date/Time   COLORURINE STRAW (A) 07/14/2022 0314   APPEARANCEUR CLEAR (A) 07/14/2022 0314   APPEARANCEUR Hazy (A) 05/23/2021 1122   LABSPEC 1.027 07/14/2022 0314   PHURINE 6.0 07/14/2022 0314   GLUCOSEU NEGATIVE 07/14/2022 0314   HGBUR NEGATIVE 07/14/2022 0314   BILIRUBINUR NEGATIVE 07/14/2022 0314   BILIRUBINUR Negative 05/23/2021 1122   KETONESUR 5 (A) 07/14/2022 0314   PROTEINUR 30 (A) 07/14/2022 0314   NITRITE NEGATIVE 07/14/2022 0314   LEUKOCYTESUR NEGATIVE 07/14/2022 0314   Sepsis Labs: @LABRCNTIP (procalcitonin:4,lacticidven:4) )No results found for this or any previous visit (from the past 240  hour(s)).   Radiological Exams on Admission: CT Abdomen Pelvis W Contrast  Result Date: 07/14/2022 CLINICAL DATA:  Abdominal pain, acute, nonlocalized, lower abdominal pain, rectal bleeding EXAM: CT ABDOMEN AND PELVIS WITH CONTRAST TECHNIQUE: Multidetector CT imaging of the abdomen and pelvis was performed using the standard protocol following bolus administration of intravenous contrast. RADIATION DOSE REDUCTION: This exam was performed according to the departmental dose-optimization program which includes automated exposure control,  adjustment of the mA and/or kV according to patient size and/or use of iterative reconstruction technique. CONTRAST:  OMNIPAQUE IOHEXOL 300 MG/ML  SOLN COMPARISON:  11/02/2009 FINDINGS: Lower chest: Emphysema.  No acute abnormality. Hepatobiliary: Nodular contour of the liver in keeping with cirrhosis. No enhancing intrahepatic mass. No intra or extrahepatic biliary ductal dilation. Gallbladder unremarkable. Pancreas: Unremarkable Spleen: Unremarkable Adrenals/Urinary Tract: The adrenal glands are unremarkable. The kidneys are normal in size and position. Multiple simple cortical cysts are seen within the kidneys bilaterally. No follow-up imaging is recommended for these lesions. The kidneys are otherwise unremarkable. The bladder is unremarkable. Stomach/Bowel: There is severe circumferential wall thickening, submucosal edema, and pericolonic inflammatory stranding involving the descending colon with milder inflammatory changes also involving the proximal and mid sigmoid colon in keeping with a long segment infectious, inflammatory, or ischemic colitis. The marginal artery and vein appear patent, however. No evidence of obstruction or perforation. No free intraperitoneal gas or fluid. The stomach, small bowel, and large bowel are otherwise unremarkable. The appendix is absent. Vascular/Lymphatic: Moderate infrarenal aortic and iliac atherosclerotic calcification. No aortic  aneurysm. No pathologic adenopathy within the abdomen and pelvis. Reproductive: Status post hysterectomy. No adnexal masses. Other: No abdominal wall hernia or abnormality. No abdominopelvic ascites. Musculoskeletal: Osseous structures are age-appropriate. No acute bone abnormality. No lytic or blastic bone lesion. IMPRESSION: 1. Long segment infectious, inflammatory, or ischemic colitis involving the descending colon and proximal and mid sigmoid colon. No evidence of obstruction or perforation. 2. Morphologic changes in keeping with cirrhosis. No enhancing intrahepatic mass. Electronically Signed   By: Helyn Numbers M.D.   On: 07/14/2022 02:23      Assessment/Plan Principal Problem:   Acute colitis Active Problems:   Severe sepsis (HCC)   Normocytic anemia   Essential hypertension   HLD (hyperlipidemia)   Type II diabetes mellitus with renal manifestations (HCC)   Chronic kidney disease, stage 3a (HCC)   Psoriatic arthritis (HCC)   Assessment and Plan:  Acute colitis and severe sepsis [facility]): pt meets criteria for severe sepsis with WBC 12.9, heart rate of 103, RR 23.  Lactic acid 2.2 which is normalized to 1.9 with IV fluid resuscitation.  Consulted Dr. Mia Creek for GI.  -Admit to telemetry bed as inpatient -Blood culture -IV Rocephin and Flagyl -IV fluid: 1 L LR, then 100 cc/h of normal saline -As needed morphine, Percocet, Tylenol for pain -As needed Zofran -Check C. Difficile  Normocytic anemia: Hemoglobin stable, 11.6 -Follow-up with CBC  Essential hypertension -IV hydralazine as needed -Lisinopril, metoprolol  HLD (hyperlipidemia) -Lipitor  Type II diabetes mellitus with renal manifestations (HCC): Recent A1c 6.3, well-controlled.  Patient taking metformin at home -Sliding scale insulin  Chronic kidney disease, stage 3a (HCC): At baseline -Follow-up with BMP  Psoriatic arthritis (HCC) -Patient is on Remicade every 8 weeks.  Last dose was on 1/4.    DVT ppx:  SCD  Code Status: DNR (I discussed with patient and explained the meaning of CODE STATUS. Patient wants to be DNR)  Family Communication:  Yes, patient's daughter  by phone  Disposition Plan:  Anticipate discharge back to previous environment  Consults called:  Dr. Mia Creek of GI  Admission status and Level of care: Telemetry Medical:    as inpt      Dispo: The patient is from: Home              Anticipated d/c is to: Home  Anticipated d/c date is: 2 days              Patient currently is not medically stable to d/c.    Severity of Illness:  The appropriate patient status for this patient is INPATIENT. Inpatient status is judged to be reasonable and necessary in order to provide the required intensity of service to ensure the patient's safety. The patient's presenting symptoms, physical exam findings, and initial radiographic and laboratory data in the context of their chronic comorbidities is felt to place them at high risk for further clinical deterioration. Furthermore, it is not anticipated that the patient will be medically stable for discharge from the hospital within 2 midnights of admission.   * I certify that at the point of admission it is my clinical judgment that the patient will require inpatient hospital care spanning beyond 2 midnights from the point of admission due to high intensity of service, high risk for further deterioration and high frequency of surveillance required.*       Date of Service 07/14/2022    Ivor Costa [facility]   If 7PM-7AM, please contact night-coverage [email or web address] 07/14/2022, 6:22 PM

## 2022-07-14 NOTE — Progress Notes (Signed)
PHARMACIST - PHYSICIAN ORDER COMMUNICATION  CONCERNING: P&T Medication Policy on Herbal Medications  DESCRIPTION:  This patient's order(s) for: Cranberry 450 mg tabs has been noted.  This product(s) is classified as an "herbal" or natural product. Due to a lack of definitive safety studies or FDA approval, nonstandard manufacturing practices, plus the potential risk of unknown drug-drug interactions while on inpatient medications, the Pharmacy and Therapeutics Committee does not permit the use of "herbal" or natural products of this type within [facility].   ACTION TAKEN: The pharmacy department is unable to verify this order at this time.  Please reevaluate patient's clinical condition at discharge and address if the herbal or natural product(s) should be resumed at that time.  Renda Rolls, PharmD, [facility] 07/14/2022 6:33 AM

## 2022-07-14 NOTE — Progress Notes (Deleted)
06/05/2022 2:54 PM   Patricia Lucas March 14, 1941 [id number]  Referring provider: Juluis Pitch, MD [phone],  [place] [address]  Urological history: 1. Cystitis -contributing factors of age, vaginal atrophy, bladder suspension (2010) -documented urine cultures over the last year   04/24/2022 Klebsiella pneumoniae  03/31/2022 No growth  03/22/2022 Klebsiella pneumoniae  10/04/2021 No growth -suppressive daily nitrofurantoin, vaginal estrogen cream and cranberry tablets twice daily  Chief Complaint  Patient presents with   Cystitis    HPI: Patricia Lucas is a 82 y.o. female who presents today for 6 weeks follow up after a trial of Myrbetriq 25 mg for nocturia.  At her visit on 04/17/2022, she had noted a sudden increase in her nocturia.  UA yellow clear, SG 1.015, pH 5.5, small blood, trace ketone, 100 protein, trace leukocytes, few bacteria and granular casts present.  Her urine culture was positive for Klebsiella pneumonia.  She was treated with culture appropriate antibiotics.  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    She continues to have issues with nocturia x3-4.  She does take Zanaflex at night which does have the side effects of urinary frequency.  She also will consume some water as she is prone to having leg cramps in the evening.  Patient denies any modifying or aggravating factors.  Patient denies any gross hematuria, dysuria or suprapubic/flank pain.  Patient denies any fevers, chills, nausea or vomiting.    PVR 74 mL   PMH: Past Medical History:  Diagnosis Date   Chronic cystitis    Chronic kidney disease    enhanced nsaid use (for psoriatic arthritis) caused renal disease.   Colon polyp    Complication of anesthesia 2017   vomitting with waking up after colonoscopy   Diabetes mellitus without complication (HCC)    Generalized OA    GERD (gastroesophageal reflux disease)    Osteoporosis    Psoriasis    Psoriatic arthritis ([place])    UTI (lower  urinary tract infection)     Surgical History: Past Surgical History:  Procedure Laterality Date   ABDOMINAL HYSTERECTOMY  1969   APPENDECTOMY     BLADDER SUSPENSION  2010   CATARACT EXTRACTION, BILATERAL     fibula fract     OVARIAN CYST REMOVAL  2015   SHOULDER ARTHROSCOPY WITH SUBACROMIAL DECOMPRESSION AND BICEP TENDON REPAIR Left 08/15/2018   Procedure: SHOULDER ARTHROSCOPY WITH DEBRIDEMENT, DECOMPRESSION, ROTATOR CUFF REPAIR AND BICEP TENODESIS-LEFT;  Surgeon: Corky Mull, MD;  Location: [facility] ORS;  Service: Orthopedics;  Laterality: Left;    Home Medications:  Allergies as of 06/05/2022       Reactions   Nsaids    NOT TO TAKE DUE TO RENAL DISEASE        Medication List        Accurate as of June 05, 2022  2:54 PM. If you have any questions, ask your nurse or doctor.          atorvastatin 20 MG tablet Commonly known as: LIPITOR Take 20 mg by mouth daily.   BIOFREEZE EX Apply 1 application topically daily as needed (joint pain).   clobetasol cream 0.05 % Commonly known as: TEMOVATE Apply 1 application topically daily as needed (inflammation).   Cranberry 450 MG Tabs Take 450 mg by mouth daily.   estradiol 0.1 MG/GM vaginal cream Commonly known as: ESTRACE Place 1 Applicatorful vaginally 2 (two) times a week.   fluticasone 50 MCG/ACT nasal spray Commonly known as: FLONASE Place 2  sprays into both nostrils daily.   gabapentin 100 MG capsule Commonly known as: NEURONTIN Take 100 mg by mouth at bedtime.   HAIR/SKIN/NAILS PO Take 1 tablet by mouth daily.   inFLIXimab 100 MG injection Commonly known as: REMICADE Inject 400 mg into the muscle every 8 (eight) weeks. Next dose due 08/20/2018   lisinopril 10 MG tablet Commonly known as: ZESTRIL Take 10 mg by mouth daily.   MAGNESIUM OXIDE PO Take 500 mg by mouth daily.   metFORMIN 500 MG tablet Commonly known as: GLUCOPHAGE Take 500 mg by mouth 2 (two) times daily.   metoprolol succinate 50  MG 24 hr tablet Commonly known as: TOPROL-XL Take 50 mg by mouth 2 (two) times daily.   mirabegron ER 25 MG Tb24 tablet Commonly known as: Myrbetriq Take 1 tablet (25 mg total) by mouth daily. Started by: Zara Council, PA-C   OMEGA 3 PO Take 1 capsule by mouth 2 (two) times daily.   tiZANidine 4 MG tablet Commonly known as: ZANAFLEX Take 2 mg by mouth every 8 (eight) hours as needed.   traZODone 50 MG tablet Commonly known as: DESYREL Take 50 mg by mouth at bedtime as needed for sleep.   Vitamin D3 25 MCG (1000 UT) Caps Take 1,000 Units by mouth at bedtime.        Allergies:  Allergies  Allergen Reactions   Nsaids     NOT TO TAKE DUE TO RENAL DISEASE    Family History: Family History  Problem Relation Age of Onset   Breast cancer Mother 23   Breast cancer Sister    Breast cancer Maternal Aunt 60   Bladder Cancer Neg Hx    Kidney cancer Neg Hx    Prostate cancer Neg Hx     Social History:  reports that she quit smoking about 24 years ago. Her smoking use included cigarettes. She smoked an average of 1 pack per day. She has never used smokeless tobacco. She reports that she does not drink alcohol and does not use drugs.  ROS: Pertinent ROS in HPI  Physical Exam: BP (!) 156/83 (BP Location: Left Arm, Patient Position: Sitting, Cuff Size: Normal)   Pulse 87   Ht 5' 2"$  (1.575 m)   Wt 146 lb 3.2 oz (66.3 kg)   BMI 26.74 kg/m   Constitutional:  Well nourished. Alert and oriented, No acute distress. HEENT: [place] AT, moist mucus membranes.  Trachea midline Cardiovascular: No clubbing, cyanosis, or edema. Respiratory: Normal respiratory effort, no increased work of breathing. Neurologic: Grossly intact, no focal deficits, moving all 4 extremities. Psychiatric: Normal mood and affect.      Laboratory Data: N/A  Pertinent Imaging:  06/05/22 14:41  Scan Result 13m    Assessment & Plan:    1. Cystitis  -resolved  2. Nocturia -She states that sometimes  she does have to drink a lot of fluids at night due to leg cramps and understands that it can increase her nighttime voiding -We also reviewed her nighttime medication and note that Zanaflex does have a side effect of urinary frequency -We will have a trial of Myrbetriq 25 mg daily, #42 samples given  Return in about 6 weeks (around 07/17/2022) for PVR and OAB questionnaire.  These notes generated with voice recognition software. I apologize for typographical errors.  [place] PMabank[address] SJean LafitteBSouth Point Glen [phone]

## 2022-07-14 NOTE — ED Notes (Addendum)
Pt O2 sat went down to 89%. Placed on 2L [place]. Tamala Julian MD aware

## 2022-07-15 DIAGNOSIS — K529 Noninfective gastroenteritis and colitis, unspecified: Secondary | ICD-10-CM | POA: Diagnosis not present

## 2022-07-15 LAB — BASIC METABOLIC PANEL
Anion gap: 9 (ref 5–15)
BUN: 13 mg/dL (ref 8–23)
CO2: 22 mmol/L (ref 22–32)
Calcium: 7.8 mg/dL — ABNORMAL LOW (ref 8.9–10.3)
Chloride: 104 mmol/L (ref 98–111)
Creatinine, Ser: 0.92 mg/dL (ref 0.44–1.00)
GFR, Estimated: >60 mL/min (ref >60)
Glucose, Bld: 111 mg/dL — ABNORMAL HIGH (ref 70–99)
Potassium: 3.7 mmol/L (ref 3.5–5.1)
Sodium: 135 mmol/L (ref 135–145)

## 2022-07-15 LAB — GLUCOSE, CAPILLARY
Glucose-Capillary: 125 mg/dL — ABNORMAL HIGH (ref 70–99)
Glucose-Capillary: 104 mg/dL — ABNORMAL HIGH (ref 70–99)
Glucose-Capillary: 98 mg/dL (ref 70–99)
Glucose-Capillary: 103 mg/dL — ABNORMAL HIGH (ref 70–99)

## 2022-07-15 LAB — CBC
HCT: 27.8 % — ABNORMAL LOW (ref 36.0–46.0)
Hemoglobin: 9 g/dL — ABNORMAL LOW (ref 12.0–15.0)
MCH: 30.3 pg (ref 26.0–34.0)
MCHC: 32.4 g/dL (ref 30.0–36.0)
MCV: 93.6 fL (ref 80.0–100.0)
Platelets: 169 10*3/uL (ref 150–400)
RBC: 2.97 MIL/uL — ABNORMAL LOW (ref 3.87–5.11)
RDW: 13.4 % (ref 11.5–15.5)
WBC: 16.9 10*3/uL — ABNORMAL HIGH (ref 4.0–10.5)
nRBC: 0 % (ref 0.0–0.2)

## 2022-07-15 NOTE — Progress Notes (Signed)
GI Inpatient Follow-up Note  Subjective:  Patient seen and still with moderate left sided abdominal pain. No diarrhea.   Scheduled Inpatient Medications:   atorvastatin  20 mg Oral Daily   cholecalciferol  1,000 Units Oral QHS   [START ON 07/17/2022] estradiol  1 Applicatorful Vaginal Once per day on Mon Thu   fluticasone  2 spray Each Nare Daily   gabapentin  100 mg Oral QHS   insulin aspart  0-5 Units Subcutaneous QHS   insulin aspart  0-9 Units Subcutaneous TID WC   lisinopril  10 mg Oral Daily   metoprolol succinate  50 mg Oral BID   mirabegron ER  25 mg Oral Daily   omega-3 acid ethyl esters  1,000 mg Oral BID    Continuous Inpatient Infusions:    cefTRIAXone (ROCEPHIN)  IV Stopped (07/15/22 9983)   metronidazole Stopped (07/15/22 1101)   promethazine (PHENERGAN) injection (IM or IVPB)      PRN Inpatient Medications:  acetaminophen **OR** acetaminophen, clobetasol cream, hydrALAZINE, morphine injection, ondansetron **OR** ondansetron (ZOFRAN) IV, oxyCODONE-acetaminophen, promethazine (PHENERGAN) injection (IM or IVPB), tiZANidine, traZODone  Review of Systems:  Review of Systems  Constitutional:  Negative for chills, fever and weight loss.  Respiratory:  Negative for shortness of breath.   Cardiovascular:  Negative for chest pain.  Gastrointestinal:  Positive for abdominal pain. Negative for constipation, diarrhea, nausea and vomiting.  Musculoskeletal:  Positive for joint pain.  Skin:  Negative for rash.  Neurological:  Negative for focal weakness.  Psychiatric/Behavioral:  Negative for substance abuse.   All other systems reviewed and are negative.    Physical Examination: BP 127/66   Pulse 88   Temp 98.3 F (36.8 C) (Oral)   Resp 18   Ht 5\' 2"  (1.575 m)   Wt 65.8 kg   SpO2 (!) 89%   BMI 26.52 kg/m  Gen: NAD, alert and oriented x 4 HEENT: PEERLA, EOMI, Neck: supple, no JVD or thyromegaly Chest: No respiratory distress Abd: soft, left sided  tenderness Ext: no edema, well perfused with 2+ pulses, Skin: no rash or lesions noted Lymph: no LAD  Data: Lab Results  Component Value Date   WBC 16.9 (H) 07/15/2022   HGB 9.0 (L) 07/15/2022   HCT 27.8 (L) 07/15/2022   MCV 93.6 07/15/2022   PLT 169 07/15/2022   Recent Labs  Lab 07/14/22 0956 07/14/22 1954 07/15/22 0720  HGB 11.6* 10.1* 9.0*   Lab Results  Component Value Date   NA 135 07/15/2022   K 3.7 07/15/2022   CL 104 07/15/2022   CO2 22 07/15/2022   BUN 13 07/15/2022   CREATININE 0.92 07/15/2022   Lab Results  Component Value Date   ALT 22 07/13/2022   AST 33 07/13/2022   ALKPHOS 54 07/13/2022   BILITOT 0.5 07/13/2022   Recent Labs  Lab 07/14/22 0956  APTT 29  INR 1.1   Assessment/Plan: [name] is a 82 y.o. lady with history of psoriatic arthritis, CKD, and hypertension who presented with abdominal pain and loose stools and found to have colitis, concerning for ischemic colitis. S/P flex sig with biopsies. Still with moderate pain localized to left side, no peritoneal signs  Recommendations:  - continue antibiotics - try to limit opioids - clear liquids, do not advance diet yet - consider IV fluids - monitor for any worsening abdominal pain or signs of peritonitis/fevers/chills that would require evaluation and potential repeat CT imaging - anticipate her pain should improve over the next day  or so - we will continue to follow  Patricia Miyamoto MD, MPH [place]

## 2022-07-15 NOTE — Progress Notes (Signed)
[place] [facility] [place] NAME: Patricia Lucas    MR#:  [id number]  DATE OF BIRTH:  [birth year]  SUBJECTIVE:   Patient came in with rectal bleed underwent flexible sigmoidoscopy showed evidence of ischemic colitis. Denies any diarrheal stools since yesterday. Feels abdominal bloating. Tolerating full liquid diet. No fever.   VITALS:  Blood pressure (!) 142/66, pulse 91, temperature 98.1 F (36.7 C), temperature source Oral, resp. rate 18, height 5\' 2"  (1.575 m), weight 65.8 kg, SpO2 92 %.  PHYSICAL EXAMINATION:   GENERAL:  82 y.o.-year-old patient lying in the bed with no acute distress.  LUNGS: Normal breath sounds bilaterally, no wheezing CARDIOVASCULAR: S1, S2 normal. No murmur   ABDOMEN: Soft, nontender, nondistended. Bowel sounds present.  EXTREMITIES: No  edema b/l.    NEUROLOGIC: nonfocal  patient is alert and awake SKIN: No obvious rash, lesion, or ulcer.   LABORATORY PANEL:  CBC Recent Labs  Lab 07/15/22 0720  WBC 16.9*  HGB 9.0*  HCT 27.8*  PLT 169    Chemistries  Recent Labs  Lab 07/13/22 2356 07/15/22 0720  NA 134* 135  K 4.6 3.7  CL 100 104  CO2 22 22  GLUCOSE 128* 111*  BUN 23 13  CREATININE 1.10* 0.92  CALCIUM 9.5 7.8*  AST 33  --   ALT 22  --   ALKPHOS 54  --   BILITOT 0.5  --    Cardiac Enzymes No results for input(s): "TROPONINI" in the last 168 hours. RADIOLOGY:  CT Abdomen Pelvis W Contrast  Result Date: 07/14/2022 CLINICAL DATA:  Abdominal pain, acute, nonlocalized, lower abdominal pain, rectal bleeding EXAM: CT ABDOMEN AND PELVIS WITH CONTRAST TECHNIQUE: Multidetector CT imaging of the abdomen and pelvis was performed using the standard protocol following bolus administration of intravenous contrast. RADIATION DOSE REDUCTION: This exam was performed according to the departmental dose-optimization program which includes automated exposure control, adjustment of the mA and/or kV according to patient  size and/or use of iterative reconstruction technique. CONTRAST:  160mL OMNIPAQUE IOHEXOL 300 MG/ML  SOLN COMPARISON:  11/02/2009 FINDINGS: Lower chest: Emphysema.  No acute abnormality. Hepatobiliary: Nodular contour of the liver in keeping with cirrhosis. No enhancing intrahepatic mass. No intra or extrahepatic biliary ductal dilation. Gallbladder unremarkable. Pancreas: Unremarkable Spleen: Unremarkable Adrenals/Urinary Tract: The adrenal glands are unremarkable. The kidneys are normal in size and position. Multiple simple cortical cysts are seen within the kidneys bilaterally. No follow-up imaging is recommended for these lesions. The kidneys are otherwise unremarkable. The bladder is unremarkable. Stomach/Bowel: There is severe circumferential wall thickening, submucosal edema, and pericolonic inflammatory stranding involving the descending colon with milder inflammatory changes also involving the proximal and mid sigmoid colon in keeping with a long segment infectious, inflammatory, or ischemic colitis. The marginal artery and vein appear patent, however. No evidence of obstruction or perforation. No free intraperitoneal gas or fluid. The stomach, small bowel, and large bowel are otherwise unremarkable. The appendix is absent. Vascular/Lymphatic: Moderate infrarenal aortic and iliac atherosclerotic calcification. No aortic aneurysm. No pathologic adenopathy within the abdomen and pelvis. Reproductive: Status post hysterectomy. No adnexal masses. Other: No abdominal wall hernia or abnormality. No abdominopelvic ascites. Musculoskeletal: Osseous structures are age-appropriate. No acute bone abnormality. No lytic or blastic bone lesion. IMPRESSION: 1. Long segment infectious, inflammatory, or ischemic colitis involving the descending colon and proximal and mid sigmoid colon. No evidence of obstruction or perforation. 2. Morphologic changes in keeping with cirrhosis. No enhancing intrahepatic mass.  Electronically  Signed   By: Fidela Salisbury M.D.   On: 07/14/2022 02:23    Assessment and Plan   Patricia Lucas is a 82 y.o. female with medical history significant of HTN, HLD, GERD, CKD-3a, who presents with nausea, vomiting, abdominal pain, bloody stool.  Patient states that her symptoms started yesterday, including nausea vomiting, abdominal pain, bloody stool.  She has vomited several times with nonbilious nonbloody vomiting, mostly dry heaves.  Her abdominal pain is located in the lower abdomen, which is constant, severe, sharp, nonradiating.  CT abdomen/pelvis: 1. Long segment infectious, inflammatory, or ischemic colitis involving the descending colon and proximal and mid sigmoid colon. No evidence of obstruction or perforation. 2. Morphologic changes in keeping with cirrhosis. No enhancing intrahepatic mass.  Acute colitis and severe sepsis [facility]): pt meets criteria for severe sepsis with WBC 12.9, heart rate of 103, RR 23.  Lactic acid 2.2 which is normalized to 1.9 with IV fluid resuscitation.   --Consulted Dr. Haig Prophet for GI. --IV Rocephin and Flagyl -- patient received IV fluids. Discontinue now tolerating PO diet -As needed morphine, Percocet, Tylenol for pain -As needed Zofran -Check C. Difficile--unable to give stool -- flex sigmoidoscopy - Blood in the recto-sigmoid colon and in the sigmoid                         colon.                        - Congested mucosa in the recto-sigmoid colon, in the                         sigmoid colon and in the descending colon. Biopsied.                        - Normal mucosa in the proximal descending colon.   Normocytic anemia: Hemoglobin stable, 11.6--10.1--9.0   Essential hypertension -IV hydralazine as needed -Lisinopril, metoprolol   HLD (hyperlipidemia) -Lipitor   Type II diabetes mellitus with renal manifestations [facility]): Recent A1c 6.3, well-controlled.  Patient taking metformin at home -Sliding scale insulin   Chronic kidney  disease, stage 3a ([place]): At baseline   Psoriatic arthritis [facility]) -Patient is on Remicade every 8 weeks.  Last dose was on 1/4.       DVT ppx: SCD  Code Status: DNR ( Dr Blaine Hamper discussed with patient and explained the meaning of CODE STATUS. Patient wants to be DNR)  Family Communication:  none today  Disposition Plan:  Anticipate discharge back to previous environment  Consults called:  Dr. Haig Prophet of GI  Admission status and Level of care: Telemetry Medical:    as inpt     Status is: Inpatient Remains inpatient appropriate because: mnx of ischemic colitis    TOTAL TIME TAKING CARE OF THIS PATIENT: 35 minutes.  >50% time spent on counselling and coordination of care  Note: This dictation was prepared with Dragon dictation along with smaller phrase technology. Any transcriptional errors that result from this process are unintentional.  Fritzi Mandes M.D    [facility]   CC: Primary care physician; Juluis Pitch, MD

## 2022-07-15 NOTE — Plan of Care (Signed)
  Problem: Fluid Volume: Goal: Hemodynamic stability will improve Outcome: Progressing   Problem: Clinical Measurements: Goal: Diagnostic test results will improve Outcome: Progressing Goal: Signs and symptoms of infection will decrease Outcome: Progressing   Problem: Respiratory: Goal: Ability to maintain adequate ventilation will improve Outcome: Progressing   Problem: Education: Goal: Ability to describe self-care measures that may prevent or decrease complications (Diabetes Survival Skills Education) will improve Outcome: Progressing Goal: Individualized Educational Video(s) Outcome: Progressing   Problem: Coping: Goal: Ability to adjust to condition or change in health will improve Outcome: Progressing   Problem: Fluid Volume: Goal: Ability to maintain a balanced intake and output will improve Outcome: Progressing   Problem: Health Behavior/Discharge Planning: Goal: Ability to identify and utilize available resources and services will improve Outcome: Progressing Goal: Ability to manage health-related needs will improve Outcome: Progressing   Problem: Metabolic: Goal: Ability to maintain appropriate glucose levels will improve Outcome: Progressing   Problem: Nutritional: Goal: Maintenance of adequate nutrition will improve Outcome: Progressing Goal: Progress toward achieving an optimal weight will improve Outcome: Progressing   Problem: Skin Integrity: Goal: Risk for impaired skin integrity will decrease Outcome: Progressing   Problem: Tissue Perfusion: Goal: Adequacy of tissue perfusion will improve Outcome: Progressing   

## 2022-07-16 DIAGNOSIS — K529 Noninfective gastroenteritis and colitis, unspecified: Secondary | ICD-10-CM | POA: Diagnosis not present

## 2022-07-16 LAB — GLUCOSE, CAPILLARY
Glucose-Capillary: 105 mg/dL — ABNORMAL HIGH (ref 70–99)
Glucose-Capillary: 169 mg/dL — ABNORMAL HIGH (ref 70–99)
Glucose-Capillary: 154 mg/dL — ABNORMAL HIGH (ref 70–99)
Glucose-Capillary: 115 mg/dL — ABNORMAL HIGH (ref 70–99)

## 2022-07-16 MED ORDER — PSYLLIUM 95 % PO PACK
1.0000 | PACK | Freq: Every day | ORAL | Status: DC
  Administered 2022-07-16 – 2022-07-17 (×2): 1 via ORAL
  Filled 2022-07-16 (×2): qty 1

## 2022-07-16 MED ORDER — SODIUM CHLORIDE 0.9 % IV SOLN: INTRAVENOUS | Status: DC

## 2022-07-16 NOTE — Progress Notes (Signed)
Patricia Lucas    MR#:  [id number]  DATE OF BIRTH:  [birth year]  SUBJECTIVE:   Patient came in with rectal bleed underwent flexible sigmoidoscopy showed evidence of ischemic colitis. Denies any diarrheal stools since yesterday. Feels abdominal bloating.  Wants to ambulate--told her ok to do so with staff or family Clear liquid diet No diarrhea   VITALS:  Blood pressure (!) 148/72, pulse 79, temperature 98.6 F (37 C), temperature source Oral, resp. rate 18, height 5\' 2"  (1.575 m), weight 65.8 kg, SpO2 92 %.  PHYSICAL EXAMINATION:   GENERAL:  82 y.o.-year-old patient lying in the bed with no acute distress.  LUNGS: Normal breath sounds bilaterally, no wheezing CARDIOVASCULAR: S1, S2 normal. No murmur   ABDOMEN: Soft, nontender, nondistended. Bowel sounds present.  EXTREMITIES: No  edema b/l.    NEUROLOGIC: nonfocal  patient is alert and awake SKIN: No obvious rash, lesion, or ulcer.   LABORATORY PANEL:  CBC Recent Labs  Lab 07/15/22 0720  WBC 16.9*  HGB 9.0*  HCT 27.8*  PLT 169     Chemistries  Recent Labs  Lab 07/13/22 2356 07/15/22 0720  NA 134* 135  K 4.6 3.7  CL 100 104  CO2 22 22  GLUCOSE 128* 111*  BUN 23 13  CREATININE 1.10* 0.92  CALCIUM 9.5 7.8*  AST 33  --   ALT 22  --   ALKPHOS 54  --   BILITOT 0.5  --     Cardiac Enzymes No results for input(s): "TROPONINI" in the last 168 hours. RADIOLOGY:  No results found.  Assessment and Plan   Patricia Lucas is a 82 y.o. female with medical history significant of HTN, HLD, GERD, CKD-3a, who presents with nausea, vomiting, abdominal pain, bloody stool.  Patient states that her symptoms started yesterday, including nausea vomiting, abdominal pain, bloody stool.  She has vomited several times with nonbilious nonbloody vomiting, mostly dry heaves.  Her abdominal pain is located in the lower abdomen, which is constant, severe, sharp,  nonradiating.  CT abdomen/pelvis: 1. Long segment infectious, inflammatory, or ischemic colitis involving the descending colon and proximal and mid sigmoid colon. No evidence of obstruction or perforation. 2. Morphologic changes in keeping with cirrhosis. No enhancing intrahepatic mass.  Acute colitis and severe sepsis [facility]): pt meets criteria for severe sepsis with WBC 12.9, heart rate of 103, RR 23.  Lactic acid 2.2 which is normalized to 1.9 with IV fluid resuscitation.   --Consulted Dr. Haig Prophet for GI. --IV Rocephin and Flagyl -- patient received IV fluids. Discontinue now tolerating PO diet-per GI cont CLD -As needed morphine, Percocet, Tylenol for pain -As needed Zofran -Check C. Difficile--unable to give stool -- flex sigmoidoscopy - Blood in the recto-sigmoid colon and in the sigmoid                         colon.                        - Congested mucosa in the recto-sigmoid colon, in the                         sigmoid colon and in the descending colon. Biopsied.                        - Normal  mucosa in the proximal descending colon.   Normocytic anemia: Hemoglobin stable, 11.6--10.1--9.0   Essential hypertension -IV hydralazine as needed -Lisinopril, metoprolol   HLD (hyperlipidemia) -Lipitor   Type II diabetes mellitus with renal manifestations [facility]): Recent A1c 6.3, well-controlled.  Patient taking metformin at home -Sliding scale insulin   Chronic kidney disease, stage 3a ([place]): At baseline   Psoriatic arthritis [facility]) -Patient is on Remicade every 8 weeks.  Last dose was on 1/4.       DVT ppx: SCD  Code Status: DNR ( Dr Blaine Hamper discussed with patient and explained the meaning of CODE STATUS. Patient wants to be DNR)  Family Communication:  none today  Disposition Plan:  Anticipate discharge back to previous environment  Consults called:  Dr. Haig Prophet of GI  Status is: Inpatient Remains inpatient appropriate because: mnx of ischemic colitis    TOTAL TIME  TAKING CARE OF THIS PATIENT: 35 minutes.  >50% time spent on counselling and coordination of care  Note: This dictation was prepared with Dragon dictation along with smaller phrase technology. Any transcriptional errors that result from this process are unintentional.  Fritzi Mandes M.D    [facility]   CC: Primary care physician; Juluis Pitch, MD

## 2022-07-16 NOTE — Plan of Care (Signed)
  Problem: Fluid Volume: Goal: Hemodynamic stability will improve Outcome: Progressing   Problem: Clinical Measurements: Goal: Diagnostic test results will improve Outcome: Progressing Goal: Signs and symptoms of infection will decrease Outcome: Progressing   Problem: Respiratory: Goal: Ability to maintain adequate ventilation will improve Outcome: Progressing   Problem: Education: Goal: Ability to describe self-care measures that may prevent or decrease complications (Diabetes Survival Skills Education) will improve Outcome: Progressing Goal: Individualized Educational Video(s) Outcome: Progressing   

## 2022-07-16 NOTE — Progress Notes (Signed)
GI Inpatient Follow-up Note  Subjective:  Patient seen and doing better than yesterday. Pain is 4/10. Some abdominal bloating.  Scheduled Inpatient Medications:   atorvastatin  20 mg Oral Daily   cholecalciferol  1,000 Units Oral QHS   [START ON 07/17/2022] estradiol  1 Applicatorful Vaginal Once per day on Mon Thu   fluticasone  2 spray Each Nare Daily   gabapentin  100 mg Oral QHS   insulin aspart  0-5 Units Subcutaneous QHS   insulin aspart  0-9 Units Subcutaneous TID WC   lisinopril  10 mg Oral Daily   metoprolol succinate  50 mg Oral BID   mirabegron ER  25 mg Oral Daily   omega-3 acid ethyl esters  1,000 mg Oral BID   psyllium  1 packet Oral Daily    Continuous Inpatient Infusions:    sodium chloride 75 mL/hr at 07/16/22 1055   cefTRIAXone (ROCEPHIN)  IV 2 g (07/16/22 9485)   metronidazole 500 mg (07/16/22 0757)   promethazine (PHENERGAN) injection (IM or IVPB)      PRN Inpatient Medications:  acetaminophen **OR** acetaminophen, clobetasol cream, hydrALAZINE, ondansetron **OR** ondansetron (ZOFRAN) IV, oxyCODONE-acetaminophen, promethazine (PHENERGAN) injection (IM or IVPB), tiZANidine, traZODone  Review of Systems:  Review of Systems  Constitutional:  Negative for chills and fever.  Respiratory:  Negative for shortness of breath.   Cardiovascular:  Negative for chest pain.  Gastrointestinal:  Positive for abdominal pain. Negative for blood in stool, constipation, diarrhea and melena.  Musculoskeletal:  Positive for joint pain.  Neurological:  Negative for focal weakness.  Psychiatric/Behavioral:  Negative for substance abuse.   All other systems reviewed and are negative.     Physical Examination: BP (!) 148/72 (BP Location: Right Arm)   Pulse 79   Temp 98.6 F (37 C) (Oral)   Resp 18   Ht 5\' 2"  (1.575 m)   Wt 65.8 kg   SpO2 92%   BMI 26.52 kg/m  Gen: NAD, alert and oriented x 4 HEENT: PEERLA, EOMI, Neck: supple, no JVD or thyromegaly Chest: No  respiratory distress Abd: distended but soft, non-tender Ext: no edema, well perfused with 2+ pulses, Skin: no rash or lesions noted Lymph: no LAD  Data: Lab Results  Component Value Date   WBC 16.9 (H) 07/15/2022   HGB 9.0 (L) 07/15/2022   HCT 27.8 (L) 07/15/2022   MCV 93.6 07/15/2022   PLT 169 07/15/2022   Recent Labs  Lab 07/14/22 0956 07/14/22 1954 07/15/22 0720  HGB 11.6* 10.1* 9.0*   Lab Results  Component Value Date   NA 135 07/15/2022   K 3.7 07/15/2022   CL 104 07/15/2022   CO2 22 07/15/2022   BUN 13 07/15/2022   CREATININE 0.92 07/15/2022   Lab Results  Component Value Date   ALT 22 07/13/2022   AST 33 07/13/2022   ALKPHOS 54 07/13/2022   BILITOT 0.5 07/13/2022   Recent Labs  Lab 07/14/22 0956  APTT 29  INR 1.1   Assessment/Plan: Patricia Lucas is a 82 y.o. lady with history of psoriatic arthritis, CKD, and hypertension who presented with abdominal pain and loose stools and found to have colitis, concerning for ischemic colitis. S/P flex sig with biopsies. Pain improving  Recommendations:  - continue antibiotics - would d/c opioids - advanced diet to soft - continue IV fluid - ordered psyllium to help have a bowel movement - if doing well tomorrow, anticipate potential discharge   Patricia Miyamoto MD, MPH [place]

## 2022-07-17 ENCOUNTER — Ambulatory Visit: Payer: Medicare Other | Admitting: Urology

## 2022-07-17 ENCOUNTER — Encounter: Payer: Self-pay | Admitting: Gastroenterology

## 2022-07-17 DIAGNOSIS — K529 Noninfective gastroenteritis and colitis, unspecified: Secondary | ICD-10-CM | POA: Diagnosis not present

## 2022-07-17 LAB — HEMOGLOBIN: Hemoglobin: 9.3 g/dL — ABNORMAL LOW (ref 12.0–15.0)

## 2022-07-17 LAB — GLUCOSE, CAPILLARY
Glucose-Capillary: 121 mg/dL — ABNORMAL HIGH (ref 70–99)
Glucose-Capillary: 153 mg/dL — ABNORMAL HIGH (ref 70–99)

## 2022-07-17 MED ORDER — PSYLLIUM 95 % PO PACK: 1.0000 | PACK | Freq: Every day | ORAL | 0 refills | Status: DC

## 2022-07-17 MED ORDER — OXYCODONE-ACETAMINOPHEN 5-325 MG PO TABS: 1.0000 | ORAL_TABLET | Freq: Four times a day (QID) | ORAL | Status: DC | PRN

## 2022-07-17 MED ORDER — AMOXICILLIN-POT CLAVULANATE 875-125 MG PO TABS: 1.0000 | ORAL_TABLET | Freq: Two times a day (BID) | ORAL | 0 refills | Status: AC

## 2022-07-17 MED ORDER — AMOXICILLIN-POT CLAVULANATE 875-125 MG PO TABS: 1.0000 | ORAL_TABLET | Freq: Two times a day (BID) | ORAL | Status: DC

## 2022-07-17 NOTE — Discharge Instructions (Signed)
Soft easy to digest diet Take yoghurt wit your meals

## 2022-07-17 NOTE — Discharge Summary (Signed)
Physician Discharge Summary   Patient: Patricia Lucas MRN: [id number] DOB: October 02, 1940  Admit date:     07/14/2022  Discharge date: 07/17/22  Discharge Physician: Enedina Finner   PCP: Dorothey Baseman, MD   Recommendations at discharge:    F/u Dr Mia Creek in 2 weeks F/u PCP as needed  Discharge Diagnoses: Principal Problem:   Acute colitis Active Problems:   Severe sepsis (HCC)   Normocytic anemia   Essential hypertension   HLD (hyperlipidemia)   Type II diabetes mellitus with renal manifestations (HCC)   Chronic kidney disease, stage 3a (HCC)   Psoriatic arthritis [facility])   Hospital Course:   Patricia Lucas is a 82 y.o. female with medical history significant of HTN, HLD, GERD, CKD-3a, who presents with nausea, vomiting, abdominal pain, bloody stool.  Patient states that her symptoms started yesterday, including nausea vomiting, abdominal pain, bloody stool.  She has vomited several times with nonbilious nonbloody vomiting, mostly dry heaves.  Her abdominal pain is located in the lower abdomen, which is constant, severe, sharp, nonradiating.   CT abdomen/pelvis: 1. Long segment infectious, inflammatory, or ischemic colitis involving the descending colon and proximal and mid sigmoid colon. No evidence of obstruction or perforation. 2. Morphologic changes in keeping with cirrhosis. No enhancing intrahepatic mass.   Acute colitis and severe sepsis [facility]): pt meets criteria for severe sepsis with WBC 12.9, heart rate of 103, RR 23.  Lactic acid 2.2 which is normalized to 1.9 with IV fluid resuscitation.   --Consulted Dr. Mia Creek for GI. --IV Rocephin and Flagyl--change to po augmentin (total 7 days abxs) --per GI now on soft diet -As needed Tylenol for pain -Check C. Difficile--unable to give stool -- flex sigmoidoscopy - Blood in the recto-sigmoid colon and in the sigmoid                         colon.                        - Congested mucosa in the recto-sigmoid colon,  in the                         sigmoid colon and in the descending colon. Biopsied.                        - Normal mucosa in the proximal descending colon. --change to po abxs and ok to dc home per GI. Pt agreeable   Normocytic anemia: Hemoglobin stable, 11.6--10.1--9.0--9.3   Essential hypertension -IV hydralazine as needed -Lisinopril, metoprolol   HLD (hyperlipidemia) -Lipitor   Type II diabetes mellitus with renal manifestations [facility]): Recent A1c 6.3, well-controlled.  Patient taking metformin at home -Sliding scale insulin   Chronic kidney disease, stage 3a (HCC): At baseline   Psoriatic arthritis [facility]) -Patient is on Remicade every 8 weeks.  Last dose was on 1/4.       DVT ppx: SCD  Code Status: DNR ( Dr Clyde Lundborg discussed with patient and explained the meaning of CODE STATUS. Patient wants to be DNR)  Family Communication:  none today  Disposition Plan:  Anticipate discharge back to previous environment  Consultants: GI Procedures performed: flex sigmoidoscopy  Disposition: Home Diet recommendation:  Discharge Diet Orders (From admission, onward)     Start     Ordered   07/17/22 0000  Diet - low sodium heart healthy  07/17/22 1543           Cardiac diet DISCHARGE MEDICATION: Allergies as of 07/17/2022       Reactions   Nsaids    NOT TO TAKE DUE TO RENAL DISEASE        Medication List     TAKE these medications    amoxicillin-clavulanate 875-125 MG tablet Commonly known as: AUGMENTIN Take 1 tablet by mouth every 12 (twelve) hours for 3 days. Start taking on: July 18, 2022   atorvastatin 20 MG tablet Commonly known as: LIPITOR Take 20 mg by mouth daily.   BIOFREEZE EX Apply 1 application topically daily as needed (joint pain).   clobetasol cream 0.05 % Commonly known as: TEMOVATE Apply 1 application topically daily as needed (inflammation).   Cranberry 450 MG Tabs Take 450 mg by mouth daily.   estradiol 0.1 MG/GM vaginal  cream Commonly known as: ESTRACE Place 1 Applicatorful vaginally 2 (two) times a week.   fluticasone 50 MCG/ACT nasal spray Commonly known as: FLONASE Place 2 sprays into both nostrils daily.   gabapentin 100 MG capsule Commonly known as: NEURONTIN Take 100 mg by mouth at bedtime.   HAIR/SKIN/NAILS PO Take 1 tablet by mouth daily.   inFLIXimab 100 MG injection Commonly known as: REMICADE Inject 400 mg into the muscle every 8 (eight) weeks. Next dose due 08/20/2018   lisinopril 10 MG tablet Commonly known as: ZESTRIL Take 10 mg by mouth daily.   MAGNESIUM OXIDE PO Take 500 mg by mouth daily.   metFORMIN 500 MG tablet Commonly known as: GLUCOPHAGE Take 500 mg by mouth 2 (two) times daily.   metoprolol succinate 50 MG 24 hr tablet Commonly known as: TOPROL-XL Take 50 mg by mouth 2 (two) times daily.   mirabegron ER 25 MG Tb24 tablet Commonly known as: Myrbetriq Take 1 tablet (25 mg total) by mouth daily.   nitrofurantoin 100 MG capsule Commonly known as: MACRODANTIN Take 100 mg by mouth at bedtime.   OMEGA 3 PO Take 1 capsule by mouth 2 (two) times daily.   psyllium 95 % Pack Commonly known as: HYDROCIL/METAMUCIL Take 1 packet by mouth daily. Start taking on: July 18, 2022   tiZANidine 4 MG tablet Commonly known as: ZANAFLEX Take 2 mg by mouth every 8 (eight) hours as needed.   traZODone 50 MG tablet Commonly known as: DESYREL Take 50 mg by mouth at bedtime as needed for sleep.   Vitamin D3 25 MCG (1000 UT) Caps Take 1,000 Units by mouth at bedtime.        Follow-up Information     Dorothey Baseman, MD. Schedule an appointment as soon as possible for a visit in 1 week(s).   Specialty: Family Medicine Contact information: [address] [place] [address] [phone]         Regis Bill, MD Follow up in 2 week(s).   Specialty: Gastroenterology Why: acute colitis f/u Contact information: [address] [place]  [address] [phone]                 Filed Weights   07/13/22 2348  Weight: 65.8 kg     Condition at discharge: fair  The results of significant diagnostics from this hospitalization (including imaging, microbiology, ancillary and laboratory) are listed below for reference.   Imaging Studies: CT Abdomen Pelvis W Contrast  Result Date: 07/14/2022 CLINICAL DATA:  Abdominal pain, acute, nonlocalized, lower abdominal pain, rectal bleeding EXAM: CT ABDOMEN AND PELVIS WITH CONTRAST  TECHNIQUE: Multidetector CT imaging of the abdomen and pelvis was performed using the standard protocol following bolus administration of intravenous contrast. RADIATION DOSE REDUCTION: This exam was performed according to the departmental dose-optimization program which includes automated exposure control, adjustment of the mA and/or kV according to patient size and/or use of iterative reconstruction technique. CONTRAST:  162mL OMNIPAQUE IOHEXOL 300 MG/ML  SOLN COMPARISON:  11/02/2009 FINDINGS: Lower chest: Emphysema.  No acute abnormality. Hepatobiliary: Nodular contour of the liver in keeping with cirrhosis. No enhancing intrahepatic mass. No intra or extrahepatic biliary ductal dilation. Gallbladder unremarkable. Pancreas: Unremarkable Spleen: Unremarkable Adrenals/Urinary Tract: The adrenal glands are unremarkable. The kidneys are normal in size and position. Multiple simple cortical cysts are seen within the kidneys bilaterally. No follow-up imaging is recommended for these lesions. The kidneys are otherwise unremarkable. The bladder is unremarkable. Stomach/Bowel: There is severe circumferential wall thickening, submucosal edema, and pericolonic inflammatory stranding involving the descending colon with milder inflammatory changes also involving the proximal and mid sigmoid colon in keeping with a long segment infectious, inflammatory, or ischemic colitis. The marginal artery and vein appear patent, however. No  evidence of obstruction or perforation. No free intraperitoneal gas or fluid. The stomach, small bowel, and large bowel are otherwise unremarkable. The appendix is absent. Vascular/Lymphatic: Moderate infrarenal aortic and iliac atherosclerotic calcification. No aortic aneurysm. No pathologic adenopathy within the abdomen and pelvis. Reproductive: Status post hysterectomy. No adnexal masses. Other: No abdominal wall hernia or abnormality. No abdominopelvic ascites. Musculoskeletal: Osseous structures are age-appropriate. No acute bone abnormality. No lytic or blastic bone lesion. IMPRESSION: 1. Long segment infectious, inflammatory, or ischemic colitis involving the descending colon and proximal and mid sigmoid colon. No evidence of obstruction or perforation. 2. Morphologic changes in keeping with cirrhosis. No enhancing intrahepatic mass. Electronically Signed   By: Fidela Salisbury M.D.   On: 07/14/2022 02:23    Microbiology: Results for orders placed or performed during the hospital encounter of 07/14/22  Culture, blood (x 2)     Status: None (Preliminary result)   Collection Time: 07/14/22 10:07 AM   Specimen: BLOOD  Result Value Ref Range Status   Specimen Description BLOOD BLOOD LEFT HAND  Final   Special Requests   Final    BOTTLES DRAWN AEROBIC AND ANAEROBIC Blood Culture adequate volume   Culture   Final    NO GROWTH 3 DAYS Performed at [facility] [address], [address] [place], [place] [address]    Report Status PENDING  Incomplete  Culture, blood (x 2)     Status: None (Preliminary result)   Collection Time: 07/14/22  2:30 PM   Specimen: BLOOD  Result Value Ref Range Status   Specimen Description BLOOD BLOOD LEFT ARM  Final   Special Requests   Final    BOTTLES DRAWN AEROBIC AND ANAEROBIC Blood Culture adequate volume   Culture   Final    NO GROWTH 3 DAYS Performed at [facility], [address] [place], [place] [address]    Report Status PENDING  Incomplete     Labs: CBC: Recent Labs  Lab 07/13/22 2356 07/14/22 0956 07/14/22 1954 07/15/22 0720 07/17/22 0915  WBC 12.9* 16.8* 18.5* 16.9*  --   HGB 11.6* 11.6* 10.1* 9.0* 9.3*  HCT 36.0 35.7* 31.3* 27.8*  --   MCV 94.5 91.8 93.7 93.6  --   PLT 243 193 185 169  --    Basic Metabolic Panel: Recent Labs  Lab 07/13/22 2356 07/15/22 0720  NA 134*  135  K 4.6 3.7  CL 100 104  CO2 22 22  GLUCOSE 128* 111*  BUN 23 13  CREATININE 1.10* 0.92  CALCIUM 9.5 7.8*   Liver Function Tests: Recent Labs  Lab 07/13/22 2356  AST 33  ALT 22  ALKPHOS 54  BILITOT 0.5  PROT 8.4*  ALBUMIN 4.5   CBG: Recent Labs  Lab 07/16/22 1130 07/16/22 1615 07/16/22 2122 07/17/22 0729 07/17/22 1156  GLUCAP 169* 154* 115* 121* 153*    Discharge time spent: greater than 30 minutes.  Signed: Fritzi Mandes, MD [facility] 07/17/2022

## 2022-07-17 NOTE — TOC Initial Note (Signed)
Transition of Care [facility]) - Initial/Assessment Note    Patient Details  Name: Patricia Lucas MRN: [id number] Date of Birth: Oct 31, 1940  Transition of Care [facility]) CM/SW Contact:    Beverly Sessions, RN Phone Number: 07/17/2022, 2:53 PM  Clinical Narrative:                   Transition of Care (TOC) Screening Note   Patient Details  Name: Patricia Lucas Date of Birth: [birth year]   Transition of Care [facility]) CM/SW Contact:    Beverly Sessions, RN Phone Number: 07/17/2022, 2:53 PM    Transition of Care Department [employer]) has reviewed patient and no TOC needs have been identified at this time. We will continue to monitor patient advancement through interdisciplinary progression rounds. If new patient transition needs arise, please place a TOC consult.         Patient Goals and CMS Choice            Expected Discharge Plan and Services                                              Prior Living Arrangements/Services                       Activities of Daily Living Home Assistive Devices/Equipment: None ADL Screening (condition at time of admission) Patient's cognitive ability adequate to safely complete daily activities?: Yes Is the patient deaf or have difficulty hearing?: No Does the patient have difficulty seeing, even when wearing glasses/contacts?: No Does the patient have difficulty concentrating, remembering, or making decisions?: No Patient able to express need for assistance with ADLs?: Yes Does the patient have difficulty dressing or bathing?: No Independently performs ADLs?: Yes (appropriate for developmental age) Does the patient have difficulty walking or climbing stairs?: No Weakness of Legs: None Weakness of Arms/Hands: None  Permission Sought/Granted                  Emotional Assessment              Admission diagnosis:  Colitis [K52.9] Lower GI bleed [K92.2] Lower abdominal pain [R10.30] Acute colitis  [K52.9] Patient Active Problem List   Diagnosis Date Noted   Acute colitis 07/14/2022   HLD (hyperlipidemia) 07/14/2022   Type II diabetes mellitus with renal manifestations ([place]) 07/14/2022   Chronic kidney disease, stage 3a ([place]) 07/14/2022   Severe sepsis ([place]) 07/14/2022   Normocytic anemia 07/14/2022   Essential hypertension 09/27/2020   Hypercholesterolemia 01/16/2020   Degenerative tear of glenoid labrum of left shoulder 08/15/2018   Rotator cuff tendinitis, left 07/22/2018   Nontraumatic complete tear of left rotator cuff 07/22/2018   Dyspepsia 07/02/2015   Fever of unknown origin 02/21/2015   Hyponatremia 01/60/1093   Ehrlichiosis 23/55/7322   H/O adenomatous polyp of colon 05/07/2014   Diabetes mellitus type 2, uncomplicated ([place]) 02/54/2706   Psoriatic arthritis ([place]) 10/08/2013   Psoriasis 10/08/2013   Post-menopausal osteoporosis 10/08/2013   Osteoarthritis 10/08/2013   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 [place] [address] Phone: [phone] Fax: [phone]     Social Determinants of Health (SDOH) Social History: SDOH Screenings   Food Insecurity: No Food Insecurity (07/14/2022)  Housing: Low Risk  (07/14/2022)  Transportation Needs: No Transportation Needs (07/14/2022)  Utilities: Not At Risk (07/14/2022)  Tobacco Use: Medium Risk (07/17/2022)   SDOH Interventions:     Readmission Risk Interventions     No data to display

## 2022-07-17 NOTE — Care Management Important Message (Signed)
Important Message  Patient Details  Name: Patricia Lucas MRN: [id number] Date of Birth: July 26, 1940   Medicare Important Message Given:  Yes     Dannette Barbara 07/17/2022, 10:58 AM

## 2022-07-17 NOTE — Progress Notes (Signed)
[facility] [place] NAME: Patricia Lucas    MR#:  [id number]  DATE OF BIRTH:  11-Oct-1940  SUBJECTIVE:   Patient came in with rectal bleed underwent flexible sigmoidoscopy showed evidence of ischemic colitis. Denies any diarrheal stools since yesterday. Feels abdominal bloating.    Pt had BM with some blood. Sore LLQ. Tolerating soft diet (placed by GI)  VITALS:  Blood pressure (!) 142/113, pulse 86, temperature 98.5 F (36.9 C), temperature source Oral, resp. rate 18, height 5\' 2"  (1.575 m), weight 65.8 kg, SpO2 96 %.  PHYSICAL EXAMINATION:   GENERAL:  82 y.o.-year-old patient lying in the bed with no acute distress.  LUNGS: Normal breath sounds bilaterally, no wheezing CARDIOVASCULAR: S1, S2 normal. No murmur   ABDOMEN: Soft mild tender LLQ EXTREMITIES: No  edema b/l.    NEUROLOGIC: nonfocal  patient is alert and awake SKIN: No obvious rash, lesion, or ulcer.   LABORATORY PANEL:  CBC Recent Labs  Lab 07/15/22 0720 07/17/22 0915  WBC 16.9*  --   HGB 9.0* 9.3*  HCT 27.8*  --   PLT 169  --      Chemistries  Recent Labs  Lab 07/13/22 2356 07/15/22 0720  NA 134* 135  K 4.6 3.7  CL 100 104  CO2 22 22  GLUCOSE 128* 111*  BUN 23 13  CREATININE 1.10* 0.92  CALCIUM 9.5 7.8*  AST 33  --   ALT 22  --   ALKPHOS 54  --   BILITOT 0.5  --      Assessment and Plan   Patricia Lucas is a 82 y.o. female with medical history significant of HTN, HLD, GERD, CKD-3a, who presents with nausea, vomiting, abdominal pain, bloody stool.  Patient states that her symptoms started yesterday, including nausea vomiting, abdominal pain, bloody stool.  She has vomited several times with nonbilious nonbloody vomiting, mostly dry heaves.  Her abdominal pain is located in the lower abdomen, which is constant, severe, sharp, nonradiating.  CT abdomen/pelvis: 1. Long segment infectious, inflammatory, or ischemic colitis involving the  descending colon and proximal and mid sigmoid colon. No evidence of obstruction or perforation. 2. Morphologic changes in keeping with cirrhosis. No enhancing intrahepatic mass.  Acute colitis and severe sepsis [place] [facility]): pt meets criteria for severe sepsis with WBC 12.9, heart rate of 103, RR 23.  Lactic acid 2.2 which is normalized to 1.9 with IV fluid resuscitation.   --Consulted Dr. Haig Prophet for GI. --IV Rocephin and Flagyl -- patient received IV fluids. Discontinue now tolerating PO diet-per GI now on soft diet -As needed Tylenol for pain -As needed Zofran -Check C. Difficile--unable to give stool -- flex sigmoidoscopy - Blood in the recto-sigmoid colon and in the sigmoid                         colon.                        - Congested mucosa in the recto-sigmoid colon, in the                         sigmoid colon and in the descending colon. Biopsied.                        - Normal mucosa in the proximal descending colon.   Normocytic anemia:  Hemoglobin stable, 11.6--10.1--9.0--9.3   Essential hypertension -IV hydralazine as needed -Lisinopril, metoprolol   HLD (hyperlipidemia) -Lipitor   Type II diabetes mellitus with renal manifestations Patricia [facility]): Recent A1c 6.3, well-controlled.  Patient taking metformin at home -Sliding scale insulin   Chronic kidney disease, stage 3a ([place]): At baseline   Psoriatic arthritis [facility]) -Patient is on Remicade every 8 weeks.  Last dose was on 1/4.       DVT ppx: SCD  Code Status: DNR ( Dr Blaine Hamper discussed with patient and explained the meaning of CODE STATUS. Patient wants to be DNR)  Family Communication:  none today  Disposition Plan:  Anticipate discharge back to previous environment  Consults called:  Dr. Haig Prophet of GI  Status is: Inpatient Remains inpatient appropriate because: mnx of ischemic colitis    TOTAL TIME TAKING CARE OF THIS PATIENT: 35 minutes.  >50% time spent on counselling and coordination of care  Note: This dictation  was prepared with Dragon dictation along with smaller phrase technology. Any transcriptional errors that result from this process are unintentional.  Fritzi Mandes M.D    [facility]   CC: Primary care physician; Juluis Pitch, MD

## 2022-07-17 NOTE — Anesthesia Postprocedure Evaluation (Signed)
Anesthesia Post Note  Patient: SENTORIA BRENT  Procedure(s) Performed: [place]  Patient location during evaluation: Endoscopy Anesthesia Type: General Level of consciousness: awake and alert Pain management: pain level controlled Vital Signs Assessment: post-procedure vital signs reviewed and stable Respiratory status: spontaneous breathing, nonlabored ventilation, respiratory function stable and patient connected to nasal cannula oxygen Cardiovascular status: blood pressure returned to baseline and stable Postop Assessment: no apparent nausea or vomiting Anesthetic complications: no   No notable events documented.   Last Vitals:  Vitals:   07/17/22 0456 07/17/22 0730  BP: (!) 168/77 (!) 142/113  Pulse: 80 86  Resp:  18  Temp: 36.9 C 36.9 C  SpO2: 95% 96%    Last Pain:  Vitals:   07/17/22 0730  TempSrc: Oral  PainSc: 0-No pain                 Precious Haws Amyri Frenz

## 2022-07-18 LAB — SURGICAL PATHOLOGY

## 2022-07-19 LAB — CULTURE, BLOOD (ROUTINE X 2)
Culture: NO GROWTH
Culture: NO GROWTH
Special Requests: ADEQUATE
Special Requests: ADEQUATE

## 2022-07-30 NOTE — Progress Notes (Unsigned)
07/31/2022 2:09 PM   Patricia Lucas 1940/12/31 [id number]  Referring provider: Juluis Pitch, MD [phone] [place],  [place] [address]  Urological history: 1. Cystitis -contributing factors of age, vaginal atrophy, bladder suspension (2010) -documented urine cultures over the last year   04/24/2022 Klebsiella pneumoniae  03/31/2022 No growth  03/22/2022 Klebsiella pneumoniae  10/04/2021 No growth -suppressive daily nitrofurantoin, vaginal estrogen cream and cranberry tablets twice daily  No chief complaint on file.   HPI: Patricia Lucas is a 82 y.o. female who presents today for 6 weeks follow up after a trial of Myrbetriq 25 mg for nocturia.  At her visit on 04/17/2022, she had noted a sudden increase in her nocturia.  UA yellow clear, SG 1.015, pH 5.5, small blood, trace ketone, 100 protein, trace leukocytes, few bacteria and granular casts present.  Her urine culture was positive for Klebsiella pneumonia.  She was treated with culture appropriate antibiotics.  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    At her visit on June 05, 2022, she continues to have issues with nocturia x3-4.  She does take Zanaflex at night which does have the side effects of urinary frequency.  She also will consume some water as she is prone to having leg cramps in the evening.  PVR 74 mL   She has been recently admitted for sepsis secondary to colitis.  PVR ***  PMH: Past Medical History:  Diagnosis Date   Chronic cystitis    Chronic kidney disease    enhanced nsaid use (for psoriatic arthritis) caused renal disease.   Colon polyp    Complication of anesthesia 2017   vomitting with waking up after colonoscopy   Diabetes mellitus without complication (HCC)    Generalized OA    GERD (gastroesophageal reflux disease)    Osteoporosis    Psoriasis    Psoriatic arthritis ([place])    UTI (lower urinary tract infection)     Surgical History: Past Surgical History:  Procedure Laterality  Date   ABDOMINAL HYSTERECTOMY  1969   APPENDECTOMY     BLADDER SUSPENSION  2010   CATARACT EXTRACTION, BILATERAL     fibula fract     FLEXIBLE SIGMOIDOSCOPY N/A 07/14/2022   Procedure: FLEXIBLE SIGMOIDOSCOPY;  Surgeon: Lesly Rubenstein, MD;  Location: [facility];  Service: Endoscopy;  Laterality: N/A;   OVARIAN CYST REMOVAL  2015   SHOULDER ARTHROSCOPY WITH SUBACROMIAL DECOMPRESSION AND BICEP TENDON REPAIR Left 08/15/2018   Procedure: SHOULDER ARTHROSCOPY WITH DEBRIDEMENT, DECOMPRESSION, ROTATOR CUFF REPAIR AND BICEP TENODESIS-LEFT;  Surgeon: Corky Mull, MD;  Location: [facility] OR[facility];  Service: Orthopedics;  Laterality: Left;    Home Medications:  Allergies as of 07/31/2022       Reactions   Nsaids    NOT TO TAKE DUE TO RENAL DISEASE        Medication List        Accurate as of July 30, 2022  2:09 PM. If you have any questions, ask your nurse or doctor.          atorvastatin 20 MG tablet Commonly known as: LIPITOR Take 20 mg by mouth daily.   BIOFREEZE EX Apply 1 application topically daily as needed (joint pain).   clobetasol cream 0.05 % Commonly known as: TEMOVATE Apply 1 application topically daily as needed (inflammation).   Cranberry 450 MG Tabs Take 450 mg by mouth daily.   estradiol 0.1 MG/GM vaginal cream Commonly known as: ESTRACE Place 1 Applicatorful vaginally 2 (two) times a  week.   fluticasone 50 MCG/ACT nasal spray Commonly known as: FLONASE Place 2 sprays into both nostrils daily.   gabapentin 100 MG capsule Commonly known as: NEURONTIN Take 100 mg by mouth at bedtime.   HAIR/SKIN/NAILS PO Take 1 tablet by mouth daily.   inFLIXimab 100 MG injection Commonly known as: REMICADE Inject 400 mg into the muscle every 8 (eight) weeks. Next dose due 08/20/2018   lisinopril 10 MG tablet Commonly known as: ZESTRIL Take 10 mg by mouth daily.   MAGNESIUM OXIDE PO Take 500 mg by mouth daily.   metFORMIN 500 MG tablet Commonly known as:  GLUCOPHAGE Take 500 mg by mouth 2 (two) times daily.   metoprolol succinate 50 MG 24 hr tablet Commonly known as: TOPROL-XL Take 50 mg by mouth 2 (two) times daily.   mirabegron ER 25 MG Tb24 tablet Commonly known as: Myrbetriq Take 1 tablet (25 mg total) by mouth daily.   nitrofurantoin 100 MG capsule Commonly known as: MACRODANTIN Take 100 mg by mouth at bedtime.   OMEGA 3 PO Take 1 capsule by mouth 2 (two) times daily.   psyllium 95 % Pack Commonly known as: HYDROCIL/METAMUCIL Take 1 packet by mouth daily.   tiZANidine 4 MG tablet Commonly known as: ZANAFLEX Take 2 mg by mouth every 8 (eight) hours as needed.   traZODone 50 MG tablet Commonly known as: DESYREL Take 50 mg by mouth at bedtime as needed for sleep.   Vitamin D3 25 MCG (1000 UT) Caps Take 1,000 Units by mouth at bedtime.        Allergies:  Allergies  Allergen Reactions   Nsaids     NOT TO TAKE DUE TO RENAL DISEASE    Family History: Family History  Problem Relation Age of Onset   Breast cancer Mother 29   Breast cancer Sister    Breast cancer Maternal Aunt 60   Bladder Cancer Neg Hx    Kidney cancer Neg Hx    Prostate cancer Neg Hx     Social History:  reports that she quit smoking about 25 years ago. Her smoking use included cigarettes. She smoked an average of 1 pack per day. She has never used smokeless tobacco. She reports that she does not drink alcohol and does not use drugs.  ROS: Pertinent ROS in HPI  Physical Exam: There were no vitals taken for this visit.  Constitutional:  Well nourished. Alert and oriented, No acute distress. HEENT: [place] AT, moist mucus membranes.  Trachea midline Cardiovascular: No clubbing, cyanosis, or edema. Respiratory: Normal respiratory effort, no increased work of breathing. Neurologic: Grossly intact, no focal deficits, moving all 4 extremities. Psychiatric: Normal mood and affect.      Laboratory Data: N/A  Pertinent  Imaging: ***   Assessment & Plan:    1. Nocturia -She states that sometimes she does have to drink a lot of fluids at night due to leg cramps and understands that it can increase her nighttime voiding -We also reviewed her nighttime medication and note that Zanaflex does have a side effect of urinary frequency -We will have a trial of Myrbetriq 25 mg daily, #42 samples given  No follow-ups on file.  These notes generated with voice recognition software. I apologize for typographical errors.  [place], [place] [address]  [place], [place] [address] [phone]

## 2022-07-31 ENCOUNTER — Encounter: Payer: Self-pay | Admitting: Urology

## 2022-07-31 ENCOUNTER — Ambulatory Visit (INDEPENDENT_AMBULATORY_CARE_PROVIDER_SITE_OTHER): Payer: Medicare Other | Admitting: Urology

## 2022-07-31 VITALS — BP 143/81 | HR 96 | Ht 62.0 in | Wt 140.0 lb

## 2022-07-31 DIAGNOSIS — N390 Urinary tract infection, site not specified: Secondary | ICD-10-CM | POA: Diagnosis not present

## 2022-07-31 DIAGNOSIS — R109 Unspecified abdominal pain: Secondary | ICD-10-CM | POA: Diagnosis not present

## 2022-07-31 DIAGNOSIS — R351 Nocturia: Secondary | ICD-10-CM

## 2022-07-31 DIAGNOSIS — R10A2 Flank pain, left side: Secondary | ICD-10-CM

## 2022-07-31 LAB — BLADDER SCAN AMB NON-IMAGING: Scan Result: 0

## 2022-07-31 MED ORDER — TROSPIUM CHLORIDE 20 MG PO TABS: 20.0000 mg | ORAL_TABLET | Freq: Every day | ORAL | 3 refills | Status: DC

## 2022-09-15 ENCOUNTER — Ambulatory Visit (INDEPENDENT_AMBULATORY_CARE_PROVIDER_SITE_OTHER): Payer: Medicare Other | Admitting: Physician Assistant

## 2022-09-15 VITALS — BP 140/85 | HR 78 | Ht 60.0 in | Wt 140.0 lb

## 2022-09-15 DIAGNOSIS — R3989 Other symptoms and signs involving the genitourinary system: Secondary | ICD-10-CM | POA: Diagnosis not present

## 2022-09-15 DIAGNOSIS — R350 Frequency of micturition: Secondary | ICD-10-CM

## 2022-09-15 DIAGNOSIS — N390 Urinary tract infection, site not specified: Secondary | ICD-10-CM

## 2022-09-15 DIAGNOSIS — R300 Dysuria: Secondary | ICD-10-CM

## 2022-09-15 DIAGNOSIS — R82998 Other abnormal findings in urine: Secondary | ICD-10-CM

## 2022-09-15 DIAGNOSIS — Z87440 Personal history of urinary (tract) infections: Secondary | ICD-10-CM

## 2022-09-15 DIAGNOSIS — R509 Fever, unspecified: Secondary | ICD-10-CM

## 2022-09-15 LAB — URINALYSIS, COMPLETE
Bilirubin, UA: NEGATIVE
Glucose, UA: NEGATIVE
Nitrite, UA: NEGATIVE
Specific Gravity, UA: 1.02 (ref 1.005–1.030)
Urobilinogen, Ur: 0.2 mg/dL (ref 0.2–1.0)
pH, UA: 5.5 (ref 5.0–7.5)

## 2022-09-15 LAB — MICROSCOPIC EXAMINATION: WBC, UA: >30 /hpf — AB (ref 0–5)

## 2022-09-15 MED ORDER — SULFAMETHOXAZOLE-TRIMETHOPRIM 800-160 MG PO TABS: 1.0000 | ORAL_TABLET | Freq: Two times a day (BID) | ORAL | 0 refills | Status: DC

## 2022-09-15 NOTE — Progress Notes (Unsigned)
09/15/2022 1:22 PM   Patricia Lucas 01-Jul-1941 [id number]  CC: Chief Complaint  Patient presents with   Follow-up   HPI: Patricia Lucas is a 82 y.o. 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  She has had some subjective fevers and chills.  She remains on daily suppressive Macrobid and notes that her current infection occurs in the setting of having accidentally skipped her topical vaginal estrogen cream for about 1 week.  Typically she uses this twice weekly.  In-office catheterized UA today positive for trace ketones, trace intact blood, 2+ protein, and 2+ leukocytes; urine microscopy with >30 WBCs/HPF, 3-10 RBCs/HPF, and many bacteria.  PMH: Past Medical History:  Diagnosis Date   Chronic cystitis    Chronic kidney disease    enhanced nsaid use (for psoriatic arthritis) caused renal disease.   Colon polyp    Complication of anesthesia 2017   vomitting with waking up after colonoscopy   Diabetes mellitus without complication (HCC)    Generalized OA    GERD (gastroesophageal reflux disease)    Osteoporosis    Psoriasis    Psoriatic arthritis ([place])    UTI (lower urinary tract infection)     Surgical History: Past Surgical History:  Procedure Laterality Date   ABDOMINAL HYSTERECTOMY  1969   APPENDECTOMY     BLADDER SUSPENSION  2010   CATARACT EXTRACTION, BILATERAL     fibula fract     FLEXIBLE SIGMOIDOSCOPY N/A 07/14/2022   Procedure: FLEXIBLE SIGMOIDOSCOPY;  Surgeon: Lesly Rubenstein, MD;  Location: [facility];  Service: Endoscopy;  Laterality: N/A;   OVARIAN CYST REMOVAL  2015   SHOULDER ARTHROSCOPY WITH SUBACROMIAL DECOMPRESSION AND BICEP TENDON REPAIR Left 08/15/2018   Procedure: SHOULDER ARTHROSCOPY WITH DEBRIDEMENT, DECOMPRESSION,  ROTATOR CUFF REPAIR AND BICEP TENODESIS-LEFT;  Surgeon: Corky Mull, MD;  Location: [facility];  Service: Orthopedics;  Laterality: Left;    Home Medications:  Allergies as of 09/15/2022       Reactions   Nsaids    NOT TO TAKE DUE TO RENAL DISEASE        Medication List        Accurate as of September 15, 2022  1:22 PM. If you have any questions, ask your nurse or doctor.          atorvastatin 20 MG tablet Commonly known as: LIPITOR Take 20 mg by mouth daily.   BIOFREEZE EX Apply 1 application topically daily as needed (joint pain).   clobetasol cream 0.05 % Commonly known as: TEMOVATE Apply 1 application topically daily as needed (inflammation).   Cranberry 450 MG Tabs Take 450 mg by mouth daily.   estradiol 0.1 MG/GM vaginal cream Commonly known as: ESTRACE Place 1 Applicatorful vaginally 2 (two) times a week.   fluticasone 50 MCG/ACT nasal spray Commonly known as: FLONASE Place 2 sprays into both nostrils daily.   gabapentin 100 MG capsule Commonly known as: NEURONTIN Take 100 mg by mouth at bedtime.   HAIR/SKIN/NAILS PO Take 1 tablet by mouth daily.   inFLIXimab 100 MG injection Commonly known as: REMICADE Inject 400 mg into the muscle every 8 (eight) weeks. Next dose due 08/20/2018   lisinopril 10 MG tablet Commonly known as: ZESTRIL Take 10 mg by mouth daily.   MAGNESIUM OXIDE PO Take  500 mg by mouth daily.   metFORMIN 500 MG tablet Commonly known as: GLUCOPHAGE Take 500 mg by mouth 2 (two) times daily.   metoprolol succinate 50 MG 24 hr tablet Commonly known as: TOPROL-XL Take 50 mg by mouth 2 (two) times daily.   nitrofurantoin 100 MG capsule Commonly known as: MACRODANTIN Take 100 mg by mouth at bedtime.   OMEGA 3 PO Take 1 capsule by mouth 2 (two) times daily.   psyllium 95 % Pack Commonly known as: HYDROCIL/METAMUCIL Take 1 packet by mouth daily.   tiZANidine 4 MG tablet Commonly known as: ZANAFLEX Take 2 mg by mouth every 8  (eight) hours as needed.   traZODone 50 MG tablet Commonly known as: DESYREL Take 50 mg by mouth at bedtime as needed for sleep.   trospium 20 MG tablet Commonly known as: SANCTURA Take 1 tablet (20 mg total) by mouth at bedtime.   Vitamin D3 25 MCG (1000 UT) capsule Generic drug: Cholecalciferol Take 1,000 Units by mouth at bedtime.        Allergies:  Allergies  Allergen Reactions   Nsaids     NOT TO TAKE DUE TO RENAL DISEASE    Family History: Family History  Problem Relation Age of Onset   Breast cancer Mother 49   Breast cancer Sister    Breast cancer Maternal Aunt 60   Bladder Cancer Neg Hx    Kidney cancer Neg Hx    Prostate cancer Neg Hx     Social History:   reports that she quit smoking about 25 years ago. Her smoking use included cigarettes. She smoked an average of 1 pack per day. She has never used smokeless tobacco. She reports that she does not drink alcohol and does not use drugs.  Physical Exam: BP (!) 140/85   Pulse 78   Ht 5' (1.524 m)   Wt 140 lb (63.5 kg)   BMI 27.34 kg/m   Constitutional:  Alert and oriented, no acute distress, nontoxic appearing HEENT: [place], AT Cardiovascular: No clubbing, cyanosis, or edema Respiratory: Normal respiratory effort, no increased work of breathing Skin: No rashes, bruises or suspicious lesions Neurologic: Grossly intact, no focal deficits, moving all 4 extremities Psychiatric: Normal mood and affect  Laboratory Data: Results for orders placed or performed in visit on 09/15/22  CULTURE, URINE COMPREHENSIVE   Specimen: Urine   UR  Result Value Ref Range   Urine Culture, Comprehensive Preliminary report (A)    Organism ID, Bacteria Gram negative rods (A)   Microscopic Examination   Urine  Result Value Ref Range   WBC, UA >30 (A) 0 - 5 /hpf   RBC, Urine 3-10 (A) 0 - 2 /hpf   Epithelial Cells (non renal) 0-10 0 - 10 /hpf   Casts Present (A) None seen /lpf   Cast Type Hyaline casts N/A   Mucus, UA  Present (A) Not Estab.   Bacteria, UA Many (A) None seen/Few  Urinalysis, Complete  Result Value Ref Range   Specific Gravity, UA 1.020 1.005 - 1.030   pH, UA 5.5 5.0 - 7.5   Color, UA Yellow Yellow   Appearance Ur Cloudy (A) Clear   Leukocytes,UA 2+ (A) Negative   Protein,UA 2+ (A) Negative/Trace   Glucose, UA Negative Negative   Ketones, UA Trace (A) Negative   RBC, UA Trace (A) Negative   Bilirubin, UA Negative Negative   Urobilinogen, Ur 0.2 0.2 - 1.0 mg/dL   Nitrite, UA Negative Negative   Microscopic  Examination See below:    Assessment & Plan:   1. Recurrent UTI UA appears grossly infected today, will start empiric Bactrim and send for culture for further evaluation.  She requests a prolonged course of antibiotic, 10 days, she is on Remicade for psoriatic arthritis.  She declines repeat UA to prove resolution of microscopic hematuria.  Will continue to monitor.  We discussed stopping daily suppressive Macrobid while on a treatment course of antibiotics and continuing topical vaginal estrogen cream, I recommend 3 times weekly. - Urinalysis, Complete - sulfamethoxazole-trimethoprim (BACTRIM DS) 800-160 MG tablet; Take 1 tablet by mouth 2 (two) times daily for 10 days.  Dispense: 20 tablet; Refill: 0 - CULTURE, URINE COMPREHENSIVE   Return if symptoms worsen or fail to improve.  Debroah Loop, PA-C  [facility] [address], [place], [place] [address] [phone]

## 2022-09-19 LAB — CULTURE, URINE COMPREHENSIVE

## 2022-09-20 ENCOUNTER — Telehealth: Payer: Self-pay | Admitting: *Deleted

## 2022-09-20 NOTE — Telephone Encounter (Signed)
-----   Message from Debroah Loop, [place] sent at 09/19/2022  5:22 PM EDT ----- Urine culture is resistant to the Bactrim I prescribed. Please have her stop it and switch to cefuroxime 250mg  BID x10 days instead.

## 2022-09-20 NOTE — Telephone Encounter (Signed)
Patient left message on triage line returning our call, I called patient back and left a message to call back to discuss results

## 2022-09-20 NOTE — Telephone Encounter (Signed)
.  left message to have patient return my call.  

## 2022-09-21 ENCOUNTER — Telehealth: Payer: Self-pay | Admitting: Physician Assistant

## 2022-09-21 DIAGNOSIS — N390 Urinary tract infection, site not specified: Secondary | ICD-10-CM

## 2022-09-21 MED ORDER — FLUCONAZOLE 150 MG PO TABS: 150.0000 mg | ORAL_TABLET | Freq: Once | ORAL | 0 refills | Status: DC

## 2022-09-21 NOTE — Telephone Encounter (Signed)
Pt saw Sam on 3/15 and took 5 days of Septra for UTI.  She said she has a yeast infection also.  She said meds did not work for UTI and wants to know if she can try something else.  She also would like Flagyl sent in for yeast infection.

## 2022-09-21 NOTE — Telephone Encounter (Signed)
Please see phone notes, we have been trying to switch her antibiotic for 2 days but unable to reach her. She should start cefuroxime as previously recommended. I have also sent in 1 dose of Diflucan for yeast infection to the [facility] in [place].

## 2022-09-22 MED ORDER — CEFUROXIME AXETIL 250 MG PO TABS: 250.0000 mg | ORAL_TABLET | Freq: Two times a day (BID) | ORAL | 0 refills | Status: DC

## 2022-09-22 MED ORDER — FLUCONAZOLE 150 MG PO TABS: 150.0000 mg | ORAL_TABLET | Freq: Once | ORAL | 0 refills | Status: AC

## 2022-09-22 MED ORDER — CEFUROXIME AXETIL 250 MG PO TABS: 250.0000 mg | ORAL_TABLET | Freq: Two times a day (BID) | ORAL | 0 refills | Status: AC

## 2022-09-22 NOTE — Telephone Encounter (Signed)
Spoke with patient and switched abx and sent to pharmacy.

## 2022-09-22 NOTE — Telephone Encounter (Signed)
Pt LMOM that RX should be sent to [facility] in [place].

## 2022-09-22 NOTE — Telephone Encounter (Signed)
Spoke with patient again to verify pharmacy, Diflucan and Ceftin sent to [facility].

## 2022-09-22 NOTE — Addendum Note (Signed)
Addended by: Despina Hidden on: 09/22/2022 01:47 PM   Modules accepted: Orders

## 2022-11-17 ENCOUNTER — Ambulatory Visit (INDEPENDENT_AMBULATORY_CARE_PROVIDER_SITE_OTHER): Payer: Medicare Other | Admitting: Physician Assistant

## 2022-11-17 VITALS — BP 161/84 | HR 84 | Ht 60.0 in | Wt 143.2 lb

## 2022-11-17 DIAGNOSIS — R350 Frequency of micturition: Secondary | ICD-10-CM | POA: Diagnosis not present

## 2022-11-17 DIAGNOSIS — R3915 Urgency of urination: Secondary | ICD-10-CM

## 2022-11-17 DIAGNOSIS — N390 Urinary tract infection, site not specified: Secondary | ICD-10-CM

## 2022-11-17 DIAGNOSIS — Z87440 Personal history of urinary (tract) infections: Secondary | ICD-10-CM

## 2022-11-17 DIAGNOSIS — R300 Dysuria: Secondary | ICD-10-CM

## 2022-11-17 LAB — URINALYSIS, COMPLETE
Bilirubin, UA: NEGATIVE
Nitrite, UA: POSITIVE — AB
Specific Gravity, UA: 1.01 (ref 1.005–1.030)
Urobilinogen, Ur: 1 mg/dL (ref 0.2–1.0)
pH, UA: 5 (ref 5.0–7.5)

## 2022-11-17 LAB — MICROSCOPIC EXAMINATION: WBC, UA: >30 /hpf — AB (ref 0–5)

## 2022-11-17 LAB — BLADDER SCAN AMB NON-IMAGING: Scan Result: 63

## 2022-11-17 MED ORDER — AMOXICILLIN-POT CLAVULANATE 875-125 MG PO TABS
1.0000 | ORAL_TABLET | Freq: Two times a day (BID) | ORAL | 0 refills | Status: AC
Start: 2022-11-17 — End: 2022-11-27

## 2022-11-17 MED ORDER — FLUCONAZOLE 150 MG PO TABS: 150.0000 mg | ORAL_TABLET | Freq: Once | ORAL | 0 refills | Status: AC

## 2022-11-17 NOTE — Progress Notes (Unsigned)
[name] presents for an office visit. BP today is _145 81__________. She is complaint with BP medication-patient states b/p always high with automatic b/p machines Greater than 140/90. Provider  notified. Pt advised to_f/u with PCP___. Pt voiced understanding.

## 2022-11-17 NOTE — Progress Notes (Unsigned)
11/17/2022 2:53 PM   Patricia Lucas 1940-08-27 [id number]  CC: Chief Complaint  Patient presents with   Urinary Frequency   HPI: Patricia Lucas is a 82 y.o. 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  Today she reports an approximate 12-day history of dysuria, urgency, and frequency.  She has been taking Azo for symptom relief, most recently last night.  She is frustrated with her frequency of infection.  She continues to take daily suppressive Macrobid, cranberry supplements twice daily, and estrogen cream twice weekly.  She wonders if she is doing anything wrong to cause these UTIs or if there is anything she can do differently to prevent them.  Notably, this would represent her second episode of acute cystitis in 2 months on suppressive Macrobid, though her last urine culture was Macrobid resistant.  In-office UA today positive for orange color, trace glucose, trace ketones, trace intact blood, 2+ protein, nitrites, and 1+ leukocytes; urine microscopy with >30 WBCs/HPF and many bacteria.  PMH: Past Medical History:  Diagnosis Date   Chronic cystitis    Chronic kidney disease    enhanced nsaid use (for psoriatic arthritis) caused renal disease.   Colon polyp    Complication of anesthesia 2017   vomitting with waking up after colonoscopy   Diabetes mellitus without complication (HCC)    Generalized OA    GERD (gastroesophageal reflux disease)    Osteoporosis    Psoriasis    Psoriatic arthritis (HCC)    UTI (lower urinary tract infection)     Surgical History: Past Surgical History:  Procedure Laterality Date   ABDOMINAL HYSTERECTOMY  1969   APPENDECTOMY     BLADDER SUSPENSION  2010   CATARACT EXTRACTION, BILATERAL     fibula fract     FLEXIBLE SIGMOIDOSCOPY N/A 07/14/2022   Procedure: FLEXIBLE SIGMOIDOSCOPY;  Surgeon: Regis Bill, MD;   Location: [facility];  Service: Endoscopy;  Laterality: N/A;   OVARIAN CYST REMOVAL  2015   SHOULDER ARTHROSCOPY WITH SUBACROMIAL DECOMPRESSION AND BICEP TENDON REPAIR Left 08/15/2018   Procedure: SHOULDER ARTHROSCOPY WITH DEBRIDEMENT, DECOMPRESSION, ROTATOR CUFF REPAIR AND BICEP TENODESIS-LEFT;  Surgeon: Christena Flake, MD;  Location: [facility];  Service: Orthopedics;  Laterality: Left;    Home Medications:  Allergies as of 11/17/2022       Reactions   Nsaids    NOT TO TAKE DUE TO RENAL DISEASE        Medication List        Accurate as of Nov 17, 2022  2:53 PM. If you have any questions, ask your nurse or doctor.          STOP taking these medications    BIOFREEZE EX Stopped by: Carman Ching, PA-C   fluticasone 50 MCG/ACT nasal spray Commonly known as: FLONASE Stopped by: Carman Ching, PA-C   trospium 20 MG tablet Commonly known as: SANCTURA Stopped by: Carman Ching, PA-C       TAKE these medications    atorvastatin 20 MG tablet Commonly known as: LIPITOR Take 20 mg by mouth daily.   clobetasol cream 0.05 % Commonly known as: TEMOVATE Apply 1 application topically daily as needed (inflammation).   Cranberry 450 MG Tabs Take 450 mg by mouth daily.   estradiol 0.1 MG/GM vaginal cream Commonly known as: ESTRACE Place 1 Applicatorful vaginally 2 (two) times a week.   gabapentin 100 MG capsule Commonly  known as: NEURONTIN Take 100 mg by mouth at bedtime.   HAIR/SKIN/NAILS PO Take 1 tablet by mouth daily.   inFLIXimab 100 MG injection Commonly known as: REMICADE Inject 400 mg into the muscle every 8 (eight) weeks. Next dose due 08/20/2018   lisinopril 10 MG tablet Commonly known as: ZESTRIL Take 10 mg by mouth daily.   MAGNESIUM OXIDE PO Take 500 mg by mouth daily.   metFORMIN 500 MG tablet Commonly known as: GLUCOPHAGE Take 500 mg by mouth 2 (two) times daily.   metoprolol succinate 50 MG 24 hr tablet Commonly  known as: TOPROL-XL Take 50 mg by mouth 2 (two) times daily.   nitrofurantoin 100 MG capsule Commonly known as: MACRODANTIN Take 100 mg by mouth at bedtime.   OMEGA 3 PO Take 1 capsule by mouth 2 (two) times daily.   psyllium 95 % Pack Commonly known as: HYDROCIL/METAMUCIL Take 1 packet by mouth daily.   tiZANidine 4 MG tablet Commonly known as: ZANAFLEX Take 2 mg by mouth every 8 (eight) hours as needed.   traZODone 50 MG tablet Commonly known as: DESYREL Take 50 mg by mouth at bedtime as needed for sleep.   Vitamin D3 25 MCG (1000 UT) Caps Take 1,000 Units by mouth at bedtime.        Allergies:  Allergies  Allergen Reactions   Nsaids     NOT TO TAKE DUE TO RENAL DISEASE    Family History: Family History  Problem Relation Age of Onset   Breast cancer Mother 56   Breast cancer Sister    Breast cancer Maternal Aunt 60   Bladder Cancer Neg Hx    Kidney cancer Neg Hx    Prostate cancer Neg Hx     Social History:   reports that she quit smoking about 25 years ago. Her smoking use included cigarettes. She smoked an average of 1 pack per day. She has never used smokeless tobacco. She reports that she does not drink alcohol and does not use drugs.  Physical Exam: BP (!) 145/81   Pulse 85   Ht 5' (1.524 m)   Wt 143 lb 4 oz (65 kg)   BMI 27.98 kg/m   Constitutional:  Alert and oriented, no acute distress, nontoxic appearing HEENT: [place], AT Cardiovascular: No clubbing, cyanosis, or edema Respiratory: Normal respiratory effort, no increased work of breathing Skin: No rashes, bruises or suspicious lesions Neurologic: Grossly intact, no focal deficits, moving all 4 extremities Psychiatric: Normal mood and affect  Laboratory Data: Results for orders placed or performed in visit on 11/17/22  CULTURE, URINE COMPREHENSIVE   Specimen: Urine   UC  Result Value Ref Range   Urine Culture, Comprehensive Preliminary report (A)    Organism ID, Bacteria Gram negative rods  (A)   Microscopic Examination   Urine  Result Value Ref Range   WBC, UA >30 (A) 0 - 5 /hpf   RBC, Urine 0-2 0 - 2 /hpf   Epithelial Cells (non renal) 0-10 0 - 10 /hpf   Bacteria, UA Many (A) None seen/Few  Urinalysis, Complete  Result Value Ref Range   Specific Gravity, UA 1.010 1.005 - 1.030   pH, UA 5.0 5.0 - 7.5   Color, UA Orange Yellow   Appearance Ur Cloudy (A) Clear   Leukocytes,UA 1+ (A) Negative   Protein,UA 2+ (A) Negative/Trace   Glucose, UA Trace (A) Negative   Ketones, UA Trace (A) Negative   RBC, UA Trace (A) Negative  Bilirubin, UA Negative Negative   Urobilinogen, Ur 1.0 0.2 - 1.0 mg/dL   Nitrite, UA Positive (A) Negative   Microscopic Examination See below:   Bladder Scan (Post Void Residual) in office  Result Value Ref Range   Scan Result 63 ml    Assessment & Plan:   1. Recurrent UTI UA appears grossly infected today.  Will start empiric Augmentin and send for culture for further evaluation.  Also sending in 1 tablet of Diflucan per patient request since she developed a yeast infection on antibiotics last time.  We discussed that I suspect she is growing Macrobid resistant bacteria again and that with 2 breakthrough UTIs within 2 months, we may need to consider adjusting her prophylactic antibiotic regimen.  Will consider making adjustments based on urine culture results from today.  We also discussed consideration of Hiprex for UTI prevention, and fortunately her renal function is well within the safe range to try this agent.  I think this may be a safer option for her long-term given recent MDR bacteria.  I also encouraged her to increase topical vaginal estrogen cream to 3 times weekly and continue cranberry supplements.  We discussed adding a probiotic, however she wishes to defer this since she is already doing so many things for UTI prevention. - Urinalysis, Complete - Bladder Scan (Post Void Residual) in office - CULTURE, URINE COMPREHENSIVE -  amoxicillin-clavulanate (AUGMENTIN) 875-125 MG tablet; Take 1 tablet by mouth 2 (two) times daily for 10 days.  Dispense: 20 tablet; Refill: 0 - fluconazole (DIFLUCAN) 150 MG tablet; Take 1 tablet (150 mg total) by mouth once for 1 dose.  Dispense: 1 tablet; Refill: 0   Return for Will call with urine culture results to consider adjusting prophylactic antibiotics.  Carman Ching, PA-C  [facility] [place] [address] [place], [place] [address] [phone]

## 2022-11-17 NOTE — Patient Instructions (Signed)
Continue daily Macrobid Increase topical vaginal estrogen cream to 3 times weekly (Monday Wednesday Friday) Continue cranberry supplements

## 2022-11-21 LAB — CULTURE, URINE COMPREHENSIVE

## 2022-12-22 ENCOUNTER — Encounter: Payer: Self-pay | Admitting: *Deleted

## 2022-12-28 ENCOUNTER — Encounter: Payer: Self-pay | Admitting: *Deleted

## 2022-12-29 ENCOUNTER — Ambulatory Visit: Payer: Medicare Other | Admitting: Certified Registered"

## 2022-12-29 ENCOUNTER — Encounter
Admission: RE | Disposition: A | Discharge status: Home / Self Care | Payer: Self-pay | Source: Home / Self Care | Attending: Gastroenterology

## 2022-12-29 ENCOUNTER — Encounter: Payer: Self-pay | Admitting: *Deleted

## 2022-12-29 ENCOUNTER — Ambulatory Visit
Admission: RE | Admit: 2022-12-29 | Discharge: 2022-12-29 | Disposition: A | Discharge status: Home / Self Care | Payer: Medicare Other | Attending: Gastroenterology | Admitting: Gastroenterology

## 2022-12-29 DIAGNOSIS — E785 Hyperlipidemia, unspecified: Secondary | ICD-10-CM | POA: Diagnosis not present

## 2022-12-29 DIAGNOSIS — Z87891 Personal history of nicotine dependence: Secondary | ICD-10-CM | POA: Insufficient documentation

## 2022-12-29 DIAGNOSIS — I10 Essential (primary) hypertension: Secondary | ICD-10-CM | POA: Diagnosis not present

## 2022-12-29 DIAGNOSIS — N189 Chronic kidney disease, unspecified: Secondary | ICD-10-CM | POA: Insufficient documentation

## 2022-12-29 DIAGNOSIS — Z79899 Other long term (current) drug therapy: Secondary | ICD-10-CM | POA: Insufficient documentation

## 2022-12-29 DIAGNOSIS — T39395A Adverse effect of other nonsteroidal anti-inflammatory drugs [NSAID], initial encounter: Secondary | ICD-10-CM | POA: Diagnosis not present

## 2022-12-29 DIAGNOSIS — E119 Type 2 diabetes mellitus without complications: Secondary | ICD-10-CM | POA: Diagnosis not present

## 2022-12-29 DIAGNOSIS — Z1211 Encounter for screening for malignant neoplasm of colon: Secondary | ICD-10-CM | POA: Insufficient documentation

## 2022-12-29 DIAGNOSIS — D123 Benign neoplasm of transverse colon: Secondary | ICD-10-CM | POA: Diagnosis not present

## 2022-12-29 DIAGNOSIS — K5730 Diverticulosis of large intestine without perforation or abscess without bleeding: Secondary | ICD-10-CM | POA: Diagnosis not present

## 2022-12-29 DIAGNOSIS — L4050 Arthropathic psoriasis, unspecified: Secondary | ICD-10-CM | POA: Diagnosis not present

## 2022-12-29 DIAGNOSIS — K6289 Other specified diseases of anus and rectum: Secondary | ICD-10-CM | POA: Insufficient documentation

## 2022-12-29 HISTORY — DX: Essential (primary) hypertension: I10

## 2022-12-29 HISTORY — PX: COLONOSCOPY WITH PROPOFOL: SHX5780

## 2022-12-29 HISTORY — PX: POLYPECTOMY: SHX5525

## 2022-12-29 LAB — GLUCOSE, CAPILLARY: Glucose-Capillary: 102 mg/dL — ABNORMAL HIGH (ref 70–99)

## 2022-12-29 SURGERY — COLONOSCOPY WITH PROPOFOL
Anesthesia: General

## 2022-12-29 MED ORDER — PROPOFOL 500 MG/50ML IV EMUL
INTRAVENOUS | Status: DC | PRN
  Administered 2022-12-29 (×2): 50 mg via INTRAVENOUS
  Administered 2022-12-29: 125 ug/kg/min via INTRAVENOUS

## 2022-12-29 MED ORDER — LIDOCAINE HCL (CARDIAC) PF 100 MG/5ML IV SOSY
PREFILLED_SYRINGE | INTRAVENOUS | Status: DC | PRN
  Administered 2022-12-29: 100 mg via INTRAVENOUS

## 2022-12-29 MED ORDER — SODIUM CHLORIDE 0.9 % IV SOLN: INTRAVENOUS | Status: DC

## 2022-12-29 NOTE — Anesthesia Postprocedure Evaluation (Signed)
Anesthesia Post Note  Patient: Patricia Lucas  Procedure(s) Performed: COLONOSCOPY WITH PROPOFOL POLYPECTOMY  Patient location during evaluation: PACU Anesthesia Type: General Level of consciousness: awake and awake and alert Pain management: satisfactory to patient Vital Signs Assessment: post-procedure vital signs reviewed and stable Respiratory status: spontaneous breathing Cardiovascular status: stable Anesthetic complications: no   There were no known notable events for this encounter.   Last Vitals:  Vitals:   12/29/22 0958 12/29/22 1008  BP: 127/68 (!) 149/73  Pulse: 83 81  Resp: 17 17  Temp:    SpO2: 95% 99%    Last Pain:  Vitals:   12/29/22 1008  TempSrc:   PainSc: 0-No pain                 VAN STAVEREN,Arrick Dutton

## 2022-12-29 NOTE — Anesthesia Preprocedure Evaluation (Addendum)
Anesthesia Evaluation  Patient identified by MRN, date of birth, ID band Patient awake    Reviewed: Allergy & Precautions, NPO status , Patient's Chart, lab work & pertinent test results  History of Anesthesia Complications (+) PONV and history of anesthetic complications  Airway Mallampati: II  TM Distance: >3 FB Neck ROM: full    Dental  (+) Teeth Intact   Pulmonary neg pulmonary ROS, Patient abstained from smoking., former smoker   Pulmonary exam normal breath sounds clear to auscultation       Cardiovascular Exercise Tolerance: Good hypertension, Pt. on medications negative cardio ROS Normal cardiovascular exam Rhythm:Regular Rate:Normal     Neuro/Psych negative neurological ROS  negative psych ROS   GI/Hepatic negative GI ROS, Neg liver ROS,GERD  ,,  Endo/Other  negative endocrine ROSdiabetes    Renal/GU negative Renal ROS  negative genitourinary   Musculoskeletal   Abdominal Normal abdominal exam  (+)   Peds negative pediatric ROS (+)  Hematology negative hematology ROS (+)   Anesthesia Other Findings Past Medical History: No date: Chronic cystitis No date: Chronic kidney disease     Comment:  enhanced nsaid use (for psoriatic arthritis) caused               renal disease. 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 No date: GERD (gastroesophageal reflux disease) No date: Hypertension No date: Osteoporosis No date: Psoriasis No date: Psoriatic arthritis (HCC) No date: UTI (lower urinary tract infection)  Past Surgical History: 1969: ABDOMINAL HYSTERECTOMY No date: APPENDECTOMY 2010: BLADDER SUSPENSION No date: CATARACT EXTRACTION, BILATERAL No date: fibula fract 07/14/2022: FLEXIBLE SIGMOIDOSCOPY; N/A     Comment:  Procedure: FLEXIBLE SIGMOIDOSCOPY;   Surgeon: Regis Bill, MD;  Location: [facility];  Service:               Endoscopy;  Laterality: N/A; 2015: OVARIAN CYST REMOVAL 08/15/2018: SHOULDER ARTHROSCOPY WITH SUBACROMIAL DECOMPRESSION AND  BICEP TENDON REPAIR; Left     Comment:  Procedure: SHOULDER ARTHROSCOPY WITH DEBRIDEMENT,               DECOMPRESSION, ROTATOR CUFF REPAIR AND BICEP               TENODESIS-LEFT;  Surgeon: Christena Flake, MD;  Location:               [facility] ORS;  Service: Orthopedics;  Laterality: Left;  BMI    Body Mass Index: 27.98 kg/m      Reproductive/Obstetrics negative OB ROS                             Anesthesia Physical Anesthesia Plan  ASA: 2  Anesthesia Plan: General   Post-op Pain Management:    Induction: Intravenous  PONV Risk Score and Plan: Propofol infusion and TIVA  Airway Management Planned: Natural Airway  Additional Equipment:   Intra-op Plan:   Post-operative Plan:   Informed Consent: I have reviewed the patients History and Physical, chart, labs and discussed the procedure including the risks, benefits and alternatives for the proposed anesthesia with the patient or authorized representative who has indicated his/her understanding and acceptance.     Dental Advisory Given  Plan Discussed with: CRNA and Surgeon  Anesthesia Plan  Comments:        Anesthesia Quick Evaluation

## 2022-12-29 NOTE — Interval H&P Note (Signed)
History and Physical Interval Note:  12/29/2022 9:19 AM  Patricia Lucas  has presented today for surgery, with the diagnosis of PH Colon Polyps.  The various methods of treatment have been discussed with the patient and family. After consideration of risks, benefits and other options for treatment, the patient has consented to  Procedure(s): COLONOSCOPY WITH PROPOFOL (N/A) as a surgical intervention.  The patient's history has been reviewed, patient examined, no change in status, stable for surgery.  I have reviewed the patient's chart and labs.  Questions were answered to the patient's satisfaction.     Regis Bill  Ok to proceed with colonoscopy

## 2022-12-29 NOTE — Transfer of Care (Signed)
Immediate Anesthesia Transfer of Care Note  Patient: Patricia Lucas  Procedure(s) Performed: COLONOSCOPY WITH PROPOFOL  Patient Location: PACU  Anesthesia Type:General  Level of Consciousness: patient cooperative and responds to stimulation  Airway & Oxygen Therapy: Patient Spontanous Breathing  Post-op Assessment: Report given to RN and Post -op Vital signs reviewed and stable  Post vital signs: stable  Last Vitals:  Vitals Value Taken Time  BP 108/63 12/29/22 0948  Temp    Pulse 91 12/29/22 0949  Resp 23 12/29/22 0949  SpO2 96 % 12/29/22 0949  Vitals shown include unvalidated device data.  Last Pain:  Vitals:   12/29/22 0813  TempSrc: Temporal  PainSc: 0-No pain         Complications: No notable events documented.

## 2022-12-29 NOTE — H&P (Signed)
Outpatient short stay form Pre-procedure 12/29/2022  Regis Bill, MD  Primary Physician: Dorothey Baseman, MD  Reason for visit:  Surveillance  History of present illness:    82 y/o lady with history of hypertension, HLD, and DM II here for colonoscopy due to history of adenomatous polyps on last colonoscopy done in 2015. No blood thinners. No family history of GI malignancies. History of hysterectomy.    Current Facility-Administered Medications:    0.9 %  sodium chloride infusion, , Intravenous, Continuous, Makaio Mach, Rossie Muskrat, MD, Last Rate: 20 mL/hr at 12/29/22 0830, New Bag at 12/29/22 0830  Medications Prior to Admission  Medication Sig Dispense Refill Last Dose   lisinopril (ZESTRIL) 10 MG tablet Take 10 mg by mouth daily.   12/29/2022   metoprolol succinate (TOPROL-XL) 50 MG 24 hr tablet Take 50 mg by mouth 2 (two) times daily.   12/29/2022   Omega-3 Fatty Acids (OMEGA 3 PO) Take 1 capsule by mouth 2 (two) times daily.    Past Week   atorvastatin (LIPITOR) 20 MG tablet Take 20 mg by mouth daily.      Biotin w/ Vitamins C & E (HAIR/SKIN/NAILS PO) Take 1 tablet by mouth daily.      Cholecalciferol (VITAMIN D3) 1000 UNITS CAPS Take 1,000 Units by mouth at bedtime.      clobetasol cream (TEMOVATE) 0.05 % Apply 1 application topically daily as needed (inflammation).      Cranberry 450 MG TABS Take 450 mg by mouth daily.       estradiol (ESTRACE) 0.1 MG/GM vaginal cream Place 1 Applicatorful vaginally 2 (two) times a week.       gabapentin (NEURONTIN) 100 MG capsule Take 100 mg by mouth at bedtime.      inFLIXimab (REMICADE) 100 MG injection Inject 400 mg into the muscle every 8 (eight) weeks. Next dose due 08/20/2018      MAGNESIUM OXIDE PO Take 500 mg by mouth daily.       metFORMIN (GLUCOPHAGE) 500 MG tablet Take 500 mg by mouth 2 (two) times daily.   12/27/2022   nitrofurantoin (MACRODANTIN) 100 MG capsule Take 100 mg by mouth at bedtime.      psyllium (HYDROCIL/METAMUCIL) 95  % PACK Take 1 packet by mouth daily. 20 each 0    tiZANidine (ZANAFLEX) 4 MG tablet Take 2 mg by mouth every 8 (eight) hours as needed.      traZODone (DESYREL) 50 MG tablet Take 50 mg by mouth at bedtime as needed for sleep.         Allergies  Allergen Reactions   Nsaids     NOT TO TAKE DUE TO RENAL DISEASE     Past Medical History:  Diagnosis Date   Chronic cystitis    Chronic kidney disease    enhanced nsaid use (for psoriatic arthritis) caused renal disease.   Colon polyp    Complication of anesthesia 2017   vomitting with waking up after colonoscopy   Diabetes mellitus without complication (HCC)    Generalized OA    GERD (gastroesophageal reflux disease)    Hypertension    Osteoporosis    Psoriasis    Psoriatic arthritis (HCC)    UTI (lower urinary tract infection)     Review of systems:  Otherwise negative.    Physical Exam  Gen: Alert, oriented. Appears stated age.  HEENT: PERRLA. Lungs: No respiratory distress CV: RRR Abd: soft, benign, no masses Ext: No edema    Planned procedures: Proceed with  colonoscopy. The patient understands the nature of the planned procedure, indications, risks, alternatives and potential complications including but not limited to bleeding, infection, perforation, damage to internal organs and possible oversedation/side effects from anesthesia. The patient agrees and gives consent to proceed.  Please refer to procedure notes for findings, recommendations and patient disposition/instructions.     Lesly Rubenstein, MD [facility]

## 2022-12-29 NOTE — Op Note (Signed)
[facility] Patient Name: Patricia Lucas Procedure Date: 12/29/2022 9:17 AM MRN: [id number] Account #: [id number] Date of Birth: April 20, 1941 Admit Type: Outpatient Age: 82 Room: [facility] ENDO ROOM 3 Gender: Female Note Status: Finalized Instrument Name: Peds Colonoscope [id number] Procedure:             Colonoscopy Indications:           Surveillance: Personal history of adenomatous polyps                         on last colonoscopy > 5 years ago Providers:             Eather Colas MD, MD Referring MD:          Teena Irani. Terance Hart, MD (Referring MD) Medicines:             Monitored Anesthesia Care Complications:         No immediate complications. Estimated blood loss:                         Minimal. Procedure:             Pre-Anesthesia Assessment:                        - Prior to the procedure, a History and Physical was                         performed, and patient medications and allergies were                         reviewed. The patient is competent. The risks and                         benefits of the procedure and the sedation options and                         risks were discussed with the patient. All questions                         were answered and informed consent was obtained.                         Patient identification and proposed procedure were                         verified by the physician, the nurse, the                         anesthesiologist, the anesthetist and the technician                         in the endoscopy suite. Mental Status Examination:                         alert and oriented. Airway Examination: normal                         oropharyngeal airway and neck mobility. Respiratory  Examination: clear to auscultation. CV Examination:                         normal. Prophylactic Antibiotics: The patient does not                         require prophylactic antibiotics. Prior                          Anticoagulants: The patient has taken no anticoagulant                         or antiplatelet agents. ASA Grade Assessment: II - A                         patient with mild systemic disease. After reviewing                         the risks and benefits, the patient was deemed in                         satisfactory condition to undergo the procedure. The                         anesthesia plan was to use monitored anesthesia care                         (MAC). Immediately prior to administration of                         medications, the patient was re-assessed for adequacy                         to receive sedatives. The heart rate, respiratory                         rate, oxygen saturations, blood pressure, adequacy of                         pulmonary ventilation, and response to care were                         monitored throughout the procedure. The physical                         status of the patient was re-assessed after the                         procedure.                        After obtaining informed consent, the colonoscope was                         passed under direct vision. Throughout the procedure,                         the patient's blood pressure, pulse, and oxygen  saturations were monitored continuously. The                         Colonoscope was introduced through the anus and                         advanced to the the cecum, identified by appendiceal                         orifice and ileocecal valve. The colonoscopy was                         somewhat difficult due to significant looping.                         Successful completion of the procedure was aided by                         applying abdominal pressure. The patient tolerated the                         procedure well. The quality of the bowel preparation                         was good. The ileocecal valve, appendiceal orifice,                          and rectum were photographed. Findings:      The perianal and digital rectal examinations were normal.      A 3 mm polyp was found in the proximal transverse colon. The polyp was       sessile. The polyp was removed with a cold snare. Resection and       retrieval were complete. Estimated blood loss was minimal.      A few small-mouthed diverticula were found in the sigmoid colon.      Anal papilla(e) were hypertrophied.      The exam was otherwise without abnormality on direct and retroflexion       views. Impression:            - One 3 mm polyp in the proximal transverse colon,                         removed with a cold snare. Resected and retrieved.                        - Diverticulosis in the sigmoid colon.                        - Anal papilla(e) were hypertrophied.                        - The examination was otherwise normal on direct and                         retroflexion views. Recommendation:        - Discharge patient to home.                        -  Resume previous diet.                        - Continue present medications.                        - Await pathology results.                        - Repeat colonoscopy is not recommended due to current                         age (71 years or older) for surveillance.                        - Return to referring physician as previously                         scheduled. Procedure Code(s):     --- Professional ---                        [id number], Colonoscopy, flexible; with removal of                         tumor(s), polyp(s), or other lesion(s) by snare                         technique Diagnosis Code(s):     --- Professional ---                        Z86.010, Personal history of colonic polyps                        D12.3, Benign neoplasm of transverse colon (hepatic                         flexure or splenic flexure)                        K62.89, Other specified diseases of anus and rectum                         K57.30, Diverticulosis of large intestine without                         perforation or abscess without bleeding CPT copyright 2022 [employer]. All rights reserved. The codes documented in this report are preliminary and upon coder review may  be revised to meet current compliance requirements. Eather Colas MD, MD 12/29/2022 9:51:17 AM Number of Addenda: 0 Note Initiated On: 12/29/2022 9:17 AM Scope Withdrawal Time: 0 hours 8 minutes 27 seconds  Total Procedure Duration: 0 hours 19 minutes 26 seconds  Estimated Blood Loss:  Estimated blood loss was minimal.      [facility]

## 2023-01-01 ENCOUNTER — Encounter: Payer: Self-pay | Admitting: Gastroenterology

## 2023-04-15 ENCOUNTER — Encounter: Payer: Self-pay | Admitting: *Deleted

## 2023-04-15 ENCOUNTER — Ambulatory Visit
Admission: EM | Admit: 2023-04-15 | Discharge: 2023-04-15 | Disposition: A | Discharge status: Home / Self Care | Payer: Medicare Other | Attending: Emergency Medicine | Admitting: Emergency Medicine

## 2023-04-15 DIAGNOSIS — N3000 Acute cystitis without hematuria: Secondary | ICD-10-CM

## 2023-04-15 LAB — POCT URINALYSIS DIP (MANUAL ENTRY)
Bilirubin, UA: NEGATIVE
Glucose, UA: NEGATIVE mg/dL
Ketones, POC UA: NEGATIVE mg/dL
Nitrite, UA: POSITIVE — AB
Protein Ur, POC: 30 mg/dL — AB
Spec Grav, UA: 1.015 (ref 1.010–1.025)
Urobilinogen, UA: 1 U/dL
pH, UA: 5 (ref 5.0–8.0)

## 2023-04-15 MED ORDER — AMOXICILLIN-POT CLAVULANATE 875-125 MG PO TABS: 1.0000 | ORAL_TABLET | Freq: Two times a day (BID) | ORAL | 0 refills | Status: AC

## 2023-04-15 MED ORDER — FLUCONAZOLE 150 MG PO TABS: 150.0000 mg | ORAL_TABLET | Freq: Every day | ORAL | 0 refills | Status: AC

## 2023-04-15 NOTE — Discharge Instructions (Addendum)
Your urinalysis shows Patricia Lucas blood cells and nitrates which are indicative of infection, your urine will be sent to the lab to determine exactly which bacteria is present, if any changes need to be made to your medications you will be notified  Begin use of Augmentin twice daily for 10 days, you have been sent Diflucan prophylactically for vaginal itching or irritation if this occurs  You may use over-the-counter Azo to help minimize your symptoms until antibiotic removes bacteria, this medication will turn your urine orange  Increase your fluid intake through use of water  As always practice good hygiene, wiping front to back and avoidance of scented vaginal products to prevent further irritation  If symptoms continue to persist after use of medication or recur please follow-up with urgent care or your primary doctor as needed

## 2023-04-15 NOTE — ED Triage Notes (Signed)
Patient states pain with urination and frequency for at least 3 days.  Took Pyridium last night, Tylenol this morning

## 2023-04-15 NOTE — ED Provider Notes (Signed)
Patricia Lucas    CSN: [id number] Arrival date & time: 04/15/23  1254      History   Chief Complaint Chief Complaint  Patient presents with   Dysuria   Flank Pain    HPI Patricia Lucas is a 82 y.o. female.   Patient presents for evaluation of dysuria, urinary frequency and left-sided flank pain beginning 3 days ago.  Has attempted use of Azo, somewhat helpful.  History of recurring UTIs followed by urology, takes daily Macrobid.  Denies abdominal pain or pressure, fevers, hematuria or vaginal symptoms.  Past Medical History:  Diagnosis Date   Chronic cystitis    Chronic kidney disease    enhanced nsaid use (for psoriatic arthritis) caused renal disease.   Colon polyp    Complication of anesthesia 2017   vomitting with waking up after colonoscopy   Diabetes mellitus without complication (HCC)    Generalized OA    GERD (gastroesophageal reflux disease)    Hypertension    Osteoporosis    Psoriasis    Psoriatic arthritis (HCC)    UTI (lower urinary tract infection)     Patient Active Problem List   Diagnosis Date Noted   Acute colitis 07/14/2022   HLD (hyperlipidemia) 07/14/2022   Type II diabetes mellitus with renal manifestations (HCC) 07/14/2022   Chronic kidney disease, stage 3a (HCC) 07/14/2022   Severe sepsis (HCC) 07/14/2022   Normocytic anemia 07/14/2022   Essential hypertension 09/27/2020   Hypercholesterolemia 01/16/2020   Degenerative tear of glenoid labrum of left shoulder 08/15/2018   Rotator cuff tendinitis, left 07/22/2018   Nontraumatic complete tear of left rotator cuff 07/22/2018   Dyspepsia 07/02/2015   Fever of unknown origin 02/21/2015   Hyponatremia 02/19/2015   Ehrlichiosis 02/01/2015   H/O adenomatous polyp of colon 05/07/2014   Diabetes mellitus type 2, uncomplicated (HCC) 03/23/2014   Psoriatic arthritis (HCC) 10/08/2013   Psoriasis 10/08/2013   Post-menopausal osteoporosis 10/08/2013   Osteoarthritis 10/08/2013   Other  chronic cystitis without hematuria 05/13/2012   Increased frequency of urination 05/13/2012   Urge incontinence 12/12/2010   Midline cystocele 12/12/2010   Urinary incontinence 09/27/2010   Genital prolapse 09/27/2010   Urinary tract infection 09/27/2010    Past Surgical History:  Procedure Laterality Date   ABDOMINAL HYSTERECTOMY  1969   APPENDECTOMY     BLADDER SUSPENSION  2010   CATARACT EXTRACTION, BILATERAL     COLONOSCOPY WITH PROPOFOL N/A 12/29/2022   Procedure: COLONOSCOPY WITH PROPOFOL;  Surgeon: Regis Bill, MD;  Location: [facility];  Service: Endoscopy;  Laterality: N/A;   fibula fract     FLEXIBLE SIGMOIDOSCOPY N/A 07/14/2022   Procedure: FLEXIBLE SIGMOIDOSCOPY;  Surgeon: Regis Bill, MD;  Location: [facility];  Service: Endoscopy;  Laterality: N/A;   OVARIAN CYST REMOVAL  2015   POLYPECTOMY  12/29/2022   Procedure: POLYPECTOMY;  Surgeon: Regis Bill, MD;  Location: [facility];  Service: Endoscopy;;   SHOULDER ARTHROSCOPY WITH SUBACROMIAL DECOMPRESSION AND BICEP TENDON REPAIR Left 08/15/2018   Procedure: SHOULDER ARTHROSCOPY WITH DEBRIDEMENT, DECOMPRESSION, ROTATOR CUFF REPAIR AND BICEP TENODESIS-LEFT;  Surgeon: Christena Flake, MD;  Location: [facility];  Service: Orthopedics;  Laterality: Left;    OB History   No obstetric history on file.      Home Medications    Prior to Admission medications   Medication Sig Start Date End Date Taking? Authorizing Provider  amoxicillin-clavulanate (AUGMENTIN) 875-125 MG tablet Take 1 tablet by mouth every 12 (twelve) hours for  10 days. 04/15/23 04/25/23 Yes Zyah Gomm R, NP  fluconazole (DIFLUCAN) 150 MG tablet Take 1 tablet (150 mg total) by mouth daily for 2 doses. 04/15/23 04/17/23 Yes Shauntay Brunelli R, NP  atorvastatin (LIPITOR) 20 MG tablet Take 20 mg by mouth daily. 04/12/21   [provider]  Biotin w/ Vitamins C & E (HAIR/SKIN/NAILS PO) Take 1 tablet by mouth daily.     [provider]  Cholecalciferol (VITAMIN D3) 1000 UNITS CAPS Take 1,000 Units by mouth at bedtime.    [provider]  clobetasol cream (TEMOVATE) 0.05 % Apply 1 application topically daily as needed (inflammation).    [provider]  Cranberry 450 MG TABS Take 450 mg by mouth daily.     [provider]  estradiol (ESTRACE) 0.1 MG/GM vaginal cream Place 1 Applicatorful vaginally 2 (two) times a week.     [provider]  gabapentin (NEURONTIN) 100 MG capsule Take 100 mg by mouth at bedtime. 03/30/21   [provider]  inFLIXimab (REMICADE) 100 MG injection Inject 400 mg into the muscle every 8 (eight) weeks. Next dose due 08/20/2018 05/13/12   [provider]  lisinopril (ZESTRIL) 10 MG tablet Take 10 mg by mouth daily. 03/30/21   [provider]  MAGNESIUM OXIDE PO Take 500 mg by mouth daily.     [provider]  metFORMIN (GLUCOPHAGE) 500 MG tablet Take 500 mg by mouth 2 (two) times daily.    [provider]  metoprolol succinate (TOPROL-XL) 50 MG 24 hr tablet Take 50 mg by mouth 2 (two) times daily. 02/23/21   [provider]  Omega-3 Fatty Acids (OMEGA 3 PO) Take 1 capsule by mouth 2 (two) times daily.     [provider]  psyllium (HYDROCIL/METAMUCIL) 95 % PACK Take 1 packet by mouth daily. 07/18/22   Enedina Finner, MD  tiZANidine (ZANAFLEX) 4 MG tablet Take 2 mg by mouth every 8 (eight) hours as needed. 03/31/21   [provider]  traZODone (DESYREL) 50 MG tablet Take 50 mg by mouth at bedtime as needed for sleep.  05/06/18   [provider]    Family History Family History  Problem Relation Age of Onset   Breast cancer Mother 48   Breast cancer Sister    Breast cancer Maternal Aunt 60   Bladder Cancer Neg Hx    Kidney cancer Neg Hx    Prostate cancer Neg Hx     Social History Social History   Tobacco Use   Smoking status: Former    Current packs/day: 0.00     Types: Cigarettes    Quit date: 1999    Years since quitting: 25.8   Smokeless tobacco: Never  Vaping Use   Vaping status: Never Used  Substance Use Topics   Alcohol use: No    Alcohol/week: 0.0 standard drinks of alcohol   Drug use: No     Allergies   Nsaids   Review of Systems Review of Systems   Physical Exam Triage Vital Signs ED Triage Vitals  Encounter Vitals Group     BP 04/15/23 1344 134/76     Systolic BP Percentile --      Diastolic BP Percentile --      Pulse Rate 04/15/23 1344 84     Resp 04/15/23 1344 18     Temp 04/15/23 1344 98.8 F (37.1 C)     Temp Source 04/15/23 1344 Oral     SpO2 04/15/23 1344 96 %  Weight 04/15/23 1342 145 lb (65.8 kg)     Height 04/15/23 1342 5\' 1"  (1.549 m)     Head Circumference --      Peak Flow --      Pain Score 04/15/23 1342 7     Pain Loc --      Pain Education --      Exclude from Growth Chart --    No data found.  Updated Vital Signs BP 134/76 (BP Location: Left Arm)   Pulse 84   Temp 98.8 F (37.1 C) (Oral)   Resp 18   Ht 5\' 1"  (1.549 m)   Wt 145 lb (65.8 kg)   SpO2 96%   BMI 27.40 kg/m   Visual Acuity Right Eye Distance:   Left Eye Distance:   Bilateral Distance:    Right Eye Near:   Left Eye Near:    Bilateral Near:     Physical Exam Constitutional:      Appearance: Normal appearance.  Eyes:     Extraocular Movements: Extraocular movements intact.  Pulmonary:     Effort: Pulmonary effort is normal.  Abdominal:     General: Abdomen is flat. Bowel sounds are normal.     Palpations: Abdomen is soft.     Tenderness: There is abdominal tenderness in the suprapubic area. There is no right CVA tenderness or left CVA tenderness.  Neurological:     Mental Status: She is alert and oriented to person, place, and time. Mental status is at baseline.      UC Treatments / Results  Labs (all labs ordered are listed, but only abnormal results are displayed) Labs Reviewed  POCT URINALYSIS DIP  (MANUAL ENTRY) - Abnormal; Notable for the following components:      Result Value   Color, UA orange (*)    Clarity, UA cloudy (*)    Blood, UA trace-intact (*)    Protein Ur, POC =30 (*)    Nitrite, UA Positive (*)    Leukocytes, UA Moderate (2+) (*)    All other components within normal limits  URINE CULTURE    EKG   Radiology No results found.  Procedures Procedures (including critical care time)  Medications Ordered in UC Medications - No data to display  Initial Impression / Assessment and Plan / UC Course  I have reviewed the triage vital signs and the nursing notes.  Pertinent labs & imaging results that were available during my care of the patient were reviewed by me and considered in my medical decision making (see chart for details).  Acute Cystitis without hematuria  Urinalysis showing leukocytes and nitrates, sent for culture, discussed findings, prescribed Augmentin and Diflucan prophylactically, has used in the past per review of urology note, has been successful, may continue use of Azo, discussed additional supportive measures and may follow-up with his urgent care as needed if symptoms persist worsen or recur Final Clinical Impressions(s) / UC Diagnoses   Final diagnoses:  Acute cystitis without hematuria     Discharge Instructions      Your urinalysis shows Jon Lall blood cells and nitrates which are indicative of infection, your urine will be sent to the lab to determine exactly which bacteria is present, if any changes need to be made to your medications you will be notified  Begin use of Augmentin twice daily for 10 days, you have been sent Diflucan prophylactically for vaginal itching or irritation if this occurs  You may use over-the-counter Azo to help minimize your  symptoms until antibiotic removes bacteria, this medication will turn your urine orange  Increase your fluid intake through use of water  As always practice good hygiene, wiping  front to back and avoidance of scented vaginal products to prevent further irritation  If symptoms continue to persist after use of medication or recur please follow-up with urgent care or your primary doctor as needed    ED Prescriptions     Medication Sig Dispense Auth. Provider   amoxicillin-clavulanate (AUGMENTIN) 875-125 MG tablet Take 1 tablet by mouth every 12 (twelve) hours for 10 days. 20 tablet Eutha Cude R, NP   fluconazole (DIFLUCAN) 150 MG tablet Take 1 tablet (150 mg total) by mouth daily for 2 doses. 2 tablet Valinda Hoar, NP      PDMP not reviewed this encounter.   Valinda Hoar, NP 04/15/23 1415

## 2023-04-18 ENCOUNTER — Telehealth: Payer: Self-pay

## 2023-04-18 LAB — URINE CULTURE: Culture: >=100000 — AB

## 2023-04-18 MED ORDER — CEPHALEXIN 500 MG PO CAPS: 500.0000 mg | ORAL_CAPSULE | Freq: Two times a day (BID) | ORAL | 0 refills | Status: DC

## 2023-04-18 NOTE — Telephone Encounter (Signed)
Per P. Lamptey, MD, "Please change antibiotics to Keflex 500mg  BID for 5 days."

## 2023-04-20 ENCOUNTER — Telehealth: Payer: Self-pay

## 2023-04-20 MED ORDER — CEPHALEXIN 500 MG PO CAPS: 500.0000 mg | ORAL_CAPSULE | Freq: Two times a day (BID) | ORAL | 0 refills | Status: AC

## 2023-04-20 NOTE — Telephone Encounter (Signed)
Called pt letting her know the providers wanted to change her antibiotics to keflex. Pt answered and would like to have it sent to [facility] in [place] instead of [facility]. I changed the pharmacy to [facility] in [place]. Pt verbalized understanding with new treatment and I answered any concerns and questions.

## 2023-05-15 ENCOUNTER — Other Ambulatory Visit: Payer: Self-pay | Admitting: Family Medicine

## 2023-05-15 DIAGNOSIS — Z1231 Encounter for screening mammogram for malignant neoplasm of breast: Secondary | ICD-10-CM

## 2023-06-11 ENCOUNTER — Ambulatory Visit
Admission: RE | Admit: 2023-06-11 | Discharge: 2023-06-11 | Disposition: A | Discharge status: Home / Self Care | Payer: Medicare Other | Source: Ambulatory Visit | Attending: Family Medicine | Admitting: Family Medicine

## 2023-06-11 DIAGNOSIS — Z1231 Encounter for screening mammogram for malignant neoplasm of breast: Secondary | ICD-10-CM | POA: Diagnosis present

## 2023-07-30 NOTE — Progress Notes (Unsigned)
08/01/2023 4:44 PM   Patricia Lucas 1940-09-11 [id number]  Referring provider: Dorothey Baseman, MD [phone] [place],  [place] [address]  Urological history: 1. rUTI's -contributing factors of age, vaginal atrophy, bladder suspension (2010) -cysto (2023) NED  -contrast CT (07/2022) - no nidus for infections -documented urine cultures over the last year   Klebsiella pneumonia, May 07, 2023  E. coli, April 15, 2023  No growth, December 31, 2022  E. coli, Nov 17, 2022  No growth, October 05, 2022  E. Coli, September 15, 2022           -suppressive daily nitrofurantoin, vaginal estrogen cream and cranberry tablets twice daily   2. Nocturia -contributing factors of age, hypertension, diabetes and arthritis  No chief complaint on file.  HPI: Patricia Lucas is a 83 y.o. female who presents today for one year follow up.   Previous records reviewed.   PVR ***  PMH: Past Medical History:  Diagnosis Date   Chronic cystitis    Chronic kidney disease    enhanced nsaid use (for psoriatic arthritis) caused renal disease.   Colon polyp    Complication of anesthesia 2017   vomitting with waking up after colonoscopy   Diabetes mellitus without complication (HCC)    Generalized OA    GERD (gastroesophageal reflux disease)    Hypertension    Osteoporosis    Psoriasis    Psoriatic arthritis (HCC)    UTI (lower urinary tract infection)     Surgical History: Past Surgical History:  Procedure Laterality Date   ABDOMINAL HYSTERECTOMY  1969   APPENDECTOMY     BLADDER SUSPENSION  2010   CATARACT EXTRACTION, BILATERAL     COLONOSCOPY WITH PROPOFOL N/A 12/29/2022   Procedure: COLONOSCOPY WITH PROPOFOL;  Surgeon: Regis Bill, MD;  Location: [facility];  Service: Endoscopy;  Laterality: N/A;   fibula fract     FLEXIBLE SIGMOIDOSCOPY N/A 07/14/2022   Procedure: FLEXIBLE SIGMOIDOSCOPY;  Surgeon: Regis Bill, MD;  Location: [facility];  Service:  Endoscopy;  Laterality: N/A;   OVARIAN CYST REMOVAL  2015   POLYPECTOMY  12/29/2022   Procedure: POLYPECTOMY;  Surgeon: Regis Bill, MD;  Location: [facility];  Service: Endoscopy;;   SHOULDER ARTHROSCOPY WITH SUBACROMIAL DECOMPRESSION AND BICEP TENDON REPAIR Left 08/15/2018   Procedure: SHOULDER ARTHROSCOPY WITH DEBRIDEMENT, DECOMPRESSION, ROTATOR CUFF REPAIR AND BICEP TENODESIS-LEFT;  Surgeon: Christena Flake, MD;  Location: [facility];  Service: Orthopedics;  Laterality: Left;    Home Medications:  Allergies as of 08/01/2023       Reactions   Nsaids    NOT TO TAKE DUE TO RENAL DISEASE        Medication List        Accurate as of July 30, 2023  4:44 PM. If you have any questions, ask your nurse or doctor.          atorvastatin 20 MG tablet Commonly known as: LIPITOR Take 20 mg by mouth daily.   clobetasol cream 0.05 % Commonly known as: TEMOVATE Apply 1 application topically daily as needed (inflammation).   Cranberry 450 MG Tabs Take 450 mg by mouth daily.   estradiol 0.1 MG/GM vaginal cream Commonly known as: ESTRACE Place 1 Applicatorful vaginally 2 (two) times a week.   gabapentin 100 MG capsule Commonly known as: NEURONTIN Take 100 mg by mouth at bedtime.   HAIR/SKIN/NAILS PO Take 1 tablet by mouth daily.   inFLIXimab 100 MG injection Commonly  known as: REMICADE Inject 400 mg into the muscle every 8 (eight) weeks. Next dose due 08/20/2018   lisinopril 10 MG tablet Commonly known as: ZESTRIL Take 10 mg by mouth daily.   MAGNESIUM OXIDE PO Take 500 mg by mouth daily.   metFORMIN 500 MG tablet Commonly known as: GLUCOPHAGE Take 500 mg by mouth 2 (two) times daily.   metoprolol succinate 50 MG 24 hr tablet Commonly known as: TOPROL-XL Take 50 mg by mouth 2 (two) times daily.   OMEGA 3 PO Take 1 capsule by mouth 2 (two) times daily.   psyllium 95 % Pack Commonly known as: HYDROCIL/METAMUCIL Take 1 packet by mouth daily.   tiZANidine  4 MG tablet Commonly known as: ZANAFLEX Take 2 mg by mouth every 8 (eight) hours as needed.   traZODone 50 MG tablet Commonly known as: DESYREL Take 50 mg by mouth at bedtime as needed for sleep.   Vitamin D3 25 MCG (1000 UT) Caps Take 1,000 Units by mouth at bedtime.        Allergies:  Allergies  Allergen Reactions   Nsaids     NOT TO TAKE DUE TO RENAL DISEASE    Family History: Family History  Problem Relation Age of Onset   Breast cancer Mother 69   Breast cancer Sister    Breast cancer Maternal Aunt 60   Bladder Cancer Neg Hx    Kidney cancer Neg Hx    Prostate cancer Neg Hx     Social History:  reports that she quit smoking about 26 years ago. Her smoking use included cigarettes. She has never used smokeless tobacco. She reports that she does not drink alcohol and does not use drugs.  ROS: Pertinent ROS in HPI  Physical Exam: There were no vitals taken for this visit.  Constitutional:  Well nourished. Alert and oriented, No acute distress. HEENT: [place] AT, moist mucus membranes.  Trachea midline, no masses. Cardiovascular: No clubbing, cyanosis, or edema. Respiratory: Normal respiratory effort, no increased work of breathing. GU: No CVA tenderness.  No bladder fullness or masses.  Recession of labia minora, dry, pale vulvar vaginal mucosa and loss of mucosal ridges and folds.  Normal urethral meatus, no lesions, no prolapse, no discharge.   No urethral masses, tenderness and/or tenderness. No bladder fullness, tenderness or masses. *** vagina mucosa, *** estrogen effect, no discharge, no lesions, *** pelvic support, *** cystocele and *** rectocele noted.  No cervical motion tenderness.  Uterus is freely mobile and non-fixed.  No adnexal/parametria masses or tenderness noted.  Anus and perineum are without rashes or lesions.   ***  Neurologic: Grossly intact, no focal deficits, moving all 4 extremities. Psychiatric: Normal mood and affect.    Laboratory  Data: Comprehensive Metabolic Panel (CMP) Order: [id number] Component Ref Range & Units 3 mo ago  Glucose 70 - 110 mg/dL 75  Sodium 409 - 811 mmol/L 137  Potassium 3.6 - 5.1 mmol/L 4.9  Chloride 97 - 109 mmol/L 102  Carbon Dioxide (CO2) 22.0 - 32.0 mmol/L 26.9  Urea Nitrogen (BUN) 7 - 25 mg/dL 26 High   Creatinine 0.6 - 1.1 mg/dL 1  Glomerular Filtration Rate (eGFR) >60 mL/min/1.73sq m 56 Low   Comment: CKD-EPI (2021) does not include patient's race in the calculation of eGFR.  Monitoring changes of plasma creatinine and eGFR over time is useful for monitoring kidney function.  Interpretive Ranges for eGFR (CKD-EPI 2021):  eGFR:       >60 mL/min/1.73 sq. m -  Normal eGFR:       30-59 mL/min/1.73 sq. m - Moderately Decreased eGFR:       15-29 mL/min/1.73 sq. m  - Severely Decreased eGFR:       < 15 mL/min/1.73 sq. m  - Kidney Failure   Note: These eGFR calculations do not apply in acute situations when eGFR is changing rapidly or patients on dialysis.  Calcium 8.7 - 10.3 mg/dL 9.4  AST 8 - 39 U/L 21  ALT 5 - 38 U/L 16  Alk Phos (alkaline Phosphatase) 34 - 104 U/L 54  Albumin 3.5 - 4.8 g/dL 4.4  Bilirubin, Total 0.3 - 1.2 mg/dL 0.4  Protein, Total 6.1 - 7.9 g/dL 7.4  A/G Ratio 1.0 - 5.0 gm/dL 1.5  Resulting Agency [facility]   Specimen Collected: 04/02/23 14:08   Performed by: [facility] Last Resulted: 04/03/23 14:07  Received From: [facility]  Result Received: 04/15/23 12:54   CBC w/auto Differential (5 Part) Order: [id number] Component Ref Range & Units 3 mo ago  WBC (White Blood Cell Count) 4.1 - 10.2 10^3/uL 6.5  RBC (Red Blood Cell Count) 4.04 - 5.48 10^6/uL 3.22 Low   Hemoglobin 12.0 - 15.0 gm/dL 9.9 Low   Hematocrit 69.6 - 47.0 % 31.2 Low   MCV (Mean Corpuscular Volume) 80.0 - 100.0 fl 96.9  MCH (Mean Corpuscular Hemoglobin) 27.0 - 31.2 pg 30.7  MCHC (Mean Corpuscular Hemoglobin  Concentration) 32.0 - 36.0 gm/dL 29.5 Low   Platelet Count 150 - 450 10^3/uL 206  RDW-CV (Red Cell Distribution Width) 11.6 - 14.8 % 13.4  MPV (Mean Platelet Volume) 9.4 - 12.4 fl 11.5  Neutrophils 1.50 - 7.80 10^3/uL 3.29  Lymphocytes 1.00 - 3.60 10^3/uL 1.96  Monocytes 0.00 - 1.50 10^3/uL 0.78  Eosinophils 0.00 - 0.55 10^3/uL 0.35  Basophils 0.00 - 0.09 10^3/uL 0.05  Neutrophil % 32.0 - 70.0 % 51  Lymphocyte % 10.0 - 50.0 % 30.4  Monocyte % 4.0 - 13.0 % 12.1  Eosinophil % 1.0 - 5.0 % 5.4 High   Basophil% 0.0 - 2.0 % 0.8  Immature Granulocyte % <=0.7 % 0.3  Immature Granulocyte Count <=0.06 10^3/L 0.02  Resulting Agency [facility]   Specimen Collected: 04/02/23 14:08   Performed by: [facility] Last Resulted: 04/03/23 11:59  Received From: [facility]  Result Received: 04/15/23 12:54   tains abnormal data Hemoglobin A1C Order: [id number] Component Ref Range & Units 3 mo ago  Hemoglobin A1C 4.2 - 5.6 % 6 High   Average Blood Glucose (Calc) mg/dL 102  Resulting Agency [facility]  Narrative Performed by [facility] Normal Range:    4.2 - 5.6% Increased Risk:  5.7 - 6.4% Diabetes:        >= 6.5% Glycemic Control for adults with diabetes:  <7%    Specimen Collected: 04/02/23 14:08   Performed by: [facility] Last Resulted: 04/03/23 13:42  Received From: [facility]  Result Received: 04/15/23 12:54   Urinalysis See EPIC and HPI  I have reviewed the labs.   Pertinent Imaging: CLINICAL DATA:  Abdominal pain, acute, nonlocalized, lower abdominal pain, rectal bleeding   EXAM: CT ABDOMEN AND PELVIS WITH CONTRAST   TECHNIQUE: Multidetector CT imaging of the abdomen and pelvis was performed using the standard protocol following bolus administration of intravenous contrast.   RADIATION DOSE REDUCTION:  This exam was performed according to  the departmental dose-optimization program which includes automated exposure control, adjustment of the mA and/or kV according to patient size and/or use of iterative reconstruction technique.   CONTRAST:  OMNIPAQUE IOHEXOL 300 MG/ML  SOLN   COMPARISON:  11/02/2009   FINDINGS: Lower chest: Emphysema.  No acute abnormality.   Hepatobiliary: Nodular contour of the liver in keeping with cirrhosis. No enhancing intrahepatic mass. No intra or extrahepatic biliary ductal dilation. Gallbladder unremarkable.   Pancreas: Unremarkable   Spleen: Unremarkable   Adrenals/Urinary Tract: The adrenal glands are unremarkable. The kidneys are normal in size and position. Multiple simple cortical cysts are seen within the kidneys bilaterally. No follow-up imaging is recommended for these lesions. The kidneys are otherwise unremarkable. The bladder is unremarkable.   Stomach/Bowel: There is severe circumferential wall thickening, submucosal edema, and pericolonic inflammatory stranding involving the descending colon with milder inflammatory changes also involving the proximal and mid sigmoid colon in keeping with a long segment infectious, inflammatory, or ischemic colitis. The marginal artery and vein appear patent, however. No evidence of obstruction or perforation. No free intraperitoneal gas or fluid. The stomach, small bowel, and large bowel are otherwise unremarkable. The appendix is absent.   Vascular/Lymphatic: Moderate infrarenal aortic and iliac atherosclerotic calcification. No aortic aneurysm. No pathologic adenopathy within the abdomen and pelvis.   Reproductive: Status post hysterectomy. No adnexal masses.   Other: No abdominal wall hernia or abnormality. No abdominopelvic ascites.   Musculoskeletal: Osseous structures are age-appropriate. No acute bone abnormality. No lytic or blastic bone lesion.   IMPRESSION: 1. Long segment infectious, inflammatory, or ischemic  colitis involving the descending colon and proximal and mid sigmoid colon. No evidence of obstruction or perforation. 2. Morphologic changes in keeping with cirrhosis. No enhancing intrahepatic mass.     Electronically Signed   By: Helyn Numbers M.D.   On: 07/14/2022 02:23 I have independently reviewed the films.    Assessment & Plan:  ***  1. rUTI's  - criteria for recurrent UTI has been met with 2 or more infections in 6 months or 3 or greater infections in one year ***  - patient is instructed to increase their water intake until the urine is pale yellow or clear (10 to 12 cups daily) ***  - patient is instructed to take probiotics (yogurt, oral pills or vaginal suppositories), take cranberry pills or drink the juice and Vitamin C 1,000 mg daily to acidify the urine ***  - if using tampons, she should remove them prior to urinating and change them often ***  - avoid soaking in tubs and wipe front to back after urinating ***  - benefit from core strengthening exercises has been seen.  We can refer to PT if they desire ***  - advised them to have CATH UA's for urinalysis and culture to prevent skin, vaginal and/or rectal contamination of the specimen  - reviewed symptoms of UTI (fevers, chills, gross hematuria, mental status changes, dysuria, suprapubic pain, back pain and/or sudden worsening of urinary symptoms) and advised not to have urine checked or be treated for UTI if not experiencing symptoms  - discussed antibiotic stewardship with the patient - explained the risk of increasing risk of antibiotic resistance with continuous exposure to antibiotics, renal failure, hypoglycemia, C. Diff infection, allergic reactions, etc.    Recurrent UTI Prevention Strategies   Stay well hydrated. Get a moderate amount of exercise. Eat a diet rich in fruit and vegetables. Start a bowel  regimen to manage your constipation. Your goal is to have consistent, formed bowel movements that are easy for  you to pass. You may use either of the over-the-counter supplements Benefiber or Miralax to help with this. I recommend that you try Benefiber first and move on to Miralax if this is not helping you enough. You may adjust the recommended dose of Miralax (one capful daily) to achieve this goal. Start taking an over-the-counter cranberry supplement for urinary tract health. Take this once or twice daily on an empty stomach, e.g. right before bed. Start taking an over-the-counter d-mannose supplement. Take this daily per packaging instructions. Start taking an over-the-counter probiotic containing the bacterial species called Lactobacillus. Take this daily. Start vaginal estrogen cream. Apply a pea-sized amount around the opening of the urethra every day for 2 weeks, then three times weekly forever.   2. Nocturia  - I explained to the patient that nocturia is often multi-factorial and difficult to treat.  Sleeping disorders, heart conditions, peripheral vascular disease, diabetes, an enlarged prostate for men, an urethral stricture causing bladder outlet obstruction and/or certain medications can contribute to nocturia.  - I have suggested that the patient avoid caffeine after noon and alcohol in the evening.  He or she may also benefit from fluid restrictions after 6:00 in the evening and voiding just prior to bedtime.  - I have explained that research studies have showed that over 84% of patients with sleep apnea reported frequent nighttime urination.   With sleep apnea, oxygen decreases, carbon dioxide increases, the blood become more acidic, the heart rate drops and blood vessels in the lung constrict.  The body is then alerted that something is very wrong. The sleeper must wake enough to reopen the airway. By this time, the heart is racing and experiences a false signal of fluid overload. The heart excretes a hormone-like protein that tells the body to get rid of sodium and water, resulting in  nocturia.  -  I also informed the patient that a recent study noted that decreasing sodium intake to 2.3 grams daily, if they don't have issues with hyponatremia, can also reduce the number of nightly voids  - There is also an increased incidence in sleep apnea with menopause, symptoms include night sweats, daytime sleepiness, depressed mood, and cognitive complaints like poor concentration or problems with short-term memory ***  - The patient may benefit from a discussion with his or her primary care physician to see if he or she has risk factors for sleep apnea or other sleep disturbances and obtaining a sleep study.  Restricting fluids in the evening (especially caffeinated beverages). Taking diuretic medication in the morning or at least six hours before bedtime. Taking afternoon naps. Naps allow your bloodstream to absorb liquid, meaning you'll need to use the bathroom after a nap. This could reduce your trips to the bathroom at night. Elevating your legs while you're sitting at home. This helps with fluid distribution. Pelvic floor physical therapy to strengthen your pelvic floor muscles. Wearing compression stockings, which also helps with fluid distribution.                                                No follow-ups on file.  These notes generated with voice recognition software. I apologize for typographical errors.  Cloretta Ned  [facility] [address]  [address]  [address] [place], [place] [address] [phone]

## 2023-08-01 ENCOUNTER — Encounter: Payer: Self-pay | Admitting: Urology

## 2023-08-01 ENCOUNTER — Ambulatory Visit (INDEPENDENT_AMBULATORY_CARE_PROVIDER_SITE_OTHER): Payer: Medicare Other | Admitting: Urology

## 2023-08-01 VITALS — BP 145/80 | HR 75 | Ht 61.0 in | Wt 140.0 lb

## 2023-08-01 DIAGNOSIS — N952 Postmenopausal atrophic vaginitis: Secondary | ICD-10-CM | POA: Diagnosis not present

## 2023-08-01 DIAGNOSIS — N390 Urinary tract infection, site not specified: Secondary | ICD-10-CM

## 2023-08-01 DIAGNOSIS — Z87440 Personal history of urinary (tract) infections: Secondary | ICD-10-CM

## 2023-08-01 DIAGNOSIS — R351 Nocturia: Secondary | ICD-10-CM

## 2023-08-01 LAB — URINALYSIS, COMPLETE
Bilirubin, UA: NEGATIVE
Glucose, UA: NEGATIVE
Ketones, UA: NEGATIVE
Leukocytes,UA: NEGATIVE
Nitrite, UA: NEGATIVE
RBC, UA: NEGATIVE
Specific Gravity, UA: 1.02 (ref 1.005–1.030)
Urobilinogen, Ur: 0.2 mg/dL (ref 0.2–1.0)
pH, UA: 5.5 (ref 5.0–7.5)

## 2023-08-01 LAB — BLADDER SCAN AMB NON-IMAGING: Scan Result: 17

## 2023-08-01 LAB — MICROSCOPIC EXAMINATION: Epithelial Cells (non renal): >10 /[HPF] — AB (ref 0–10)

## 2023-08-01 MED ORDER — ESTRADIOL 0.1 MG/GM VA CREA
TOPICAL_CREAM | VAGINAL | 12 refills | Status: AC
Start: 2023-08-01 — End: ?

## 2023-08-01 NOTE — Patient Instructions (Signed)
  Recurrent UTI Prevention Strategies   Stay well hydrated. Get a moderate amount of exercise. Eat a diet rich in fruit and vegetables. Start a bowel regimen if you have constipation. Your goal is to have consistent, formed bowel movements that are easy for you to pass. You may use either of the over-the-counter supplements Benefiber or Miralax to help with this. I recommend that you try Benefiber first and move on to Miralax if this is not helping you enough. You may adjust the recommended dose of Miralax (one capful daily) to achieve this goal. Continue taking an over-the-counter cranberry supplement for urinary tract health. Take this once or twice daily on an empty stomach, e.g. right before bed. Start taking an over-the-counter d-mannose supplement. Take this daily per packaging instructions. Start taking an over-the-counter probiotic containing the bacterial species called Lactobacillus. Take this daily. Continue vaginal estrogen cream. Apply a pea-sized amount around the opening of the urethra every day for 2 weeks, then three times weekly forever.

## 2023-09-18 NOTE — Progress Notes (Deleted)
 09/20/2023 12:35 PM   Patricia Lucas 1941/07/02 [id number]  Referring provider: Dorothey Baseman, MD [id number] [place],  [place] [address]  Urological history: 1. rUTI's -contributing factors of age, vaginal atrophy, bladder suspension (2010) -cysto (2023) NED  -contrast CT (07/2022) - no nidus for infections -documented urine cultures over the last year   Klebsiella pneumonia, May 07, 2023  E. coli, April 15, 2023  No growth, December 31, 2022  E. coli, Nov 17, 2022  No growth, October 05, 2022 -vaginal estrogen cream and cranberry tablets twice daily   2. Nocturia -contributing factors of age, hypertension, diabetes and arthritis  No chief complaint on file.  HPI: Patricia Lucas is a 83 y.o. female who presents today for urinary frequency and pain.   Previous records reviewed.   She was seen at [facility] urgent care for dysuria and frequency.   She was placed on Omnicef.  UA dark yellow cloudy, specific gravity 1.018, ph 6.0, 1+ protein, nitrite positive, 3+ leukocytes and  > 182 WBC's.  Urine culture pending.    PMH: Past Medical History:  Diagnosis Date   Chronic cystitis    Chronic kidney disease    enhanced nsaid use (for psoriatic arthritis) caused renal disease.   Colon polyp    Complication of anesthesia 2017   vomitting with waking up after colonoscopy   Diabetes mellitus without complication (HCC)    Generalized OA    GERD (gastroesophageal reflux disease)    Hypertension    Osteoporosis    Psoriasis    Psoriatic arthritis (HCC)    UTI (lower urinary tract infection)     Surgical History: Past Surgical History:  Procedure Laterality Date   ABDOMINAL HYSTERECTOMY  1969   APPENDECTOMY     BLADDER SUSPENSION  2010   CATARACT EXTRACTION, BILATERAL     COLONOSCOPY WITH PROPOFOL N/A 12/29/2022   Procedure: COLONOSCOPY WITH PROPOFOL;  Surgeon: Regis Bill, MD;  Location: [facility] ENDOSCOPY;  Service: Endoscopy;  Laterality: N/A;   fibula  fract     FLEXIBLE SIGMOIDOSCOPY N/A 07/14/2022   Procedure: FLEXIBLE SIGMOIDOSCOPY;  Surgeon: Regis Bill, MD;  Location: [facility];  Service: Endoscopy;  Laterality: N/A;   OVARIAN CYST REMOVAL  2015   POLYPECTOMY  12/29/2022   Procedure: POLYPECTOMY;  Surgeon: Regis Bill, MD;  Location: [facility]OSCOPY;  Service: Endoscopy;;   SHOULDER ARTHROSCOPY WITH SUBACROMIAL DECOMPRESSION AND BICEP TENDON REPAIR Left 08/15/2018   Procedure: SHOULDER ARTHROSCOPY WITH DEBRIDEMENT, DECOMPRESSION, ROTATOR CUFF REPAIR AND BICEP TENODESIS-LEFT;  Surgeon: Christena Flake, MD;  Location: [facility] ORS;  Service: Orthopedics;  Laterality: Left;    Home Medications:  Allergies as of 09/20/2023       Reactions   Nsaids    NOT TO TAKE DUE TO RENAL DISEASE        Medication List        Accurate as of September 18, 2023 12:35 PM. If you have any questions, ask your nurse or doctor.          atorvastatin 20 MG tablet Commonly known as: LIPITOR Take 20 mg by mouth daily.   clobetasol cream 0.05 % Commonly known as: TEMOVATE Apply 1 application topically daily as needed (inflammation).   Cranberry 450 MG Tabs Take 450 mg by mouth daily.   estradiol 0.1 MG/GM vaginal cream Commonly known as: ESTRACE VAGINAL Apply 0.5mg  (pea-sized amount)  just inside the vaginal introitus with a finger-tip on Monday, Wednesday and Friday nights.  gabapentin 100 MG capsule Commonly known as: NEURONTIN Take 100 mg by mouth at bedtime.   HAIR/SKIN/NAILS PO Take 1 tablet by mouth daily.   inFLIXimab 100 MG injection Commonly known as: REMICADE Inject 400 mg into the muscle every 8 (eight) weeks. Next dose due 08/20/2018   lisinopril 10 MG tablet Commonly known as: ZESTRIL Take 10 mg by mouth daily.   MAGNESIUM OXIDE PO Take 500 mg by mouth daily.   metFORMIN 500 MG tablet Commonly known as: GLUCOPHAGE Take 500 mg by mouth 2 (two) times daily.   metoprolol succinate 50 MG 24 hr  tablet Commonly known as: TOPROL-XL Take 50 mg by mouth 2 (two) times daily.   nitrofurantoin 100 MG capsule Commonly known as: MACRODANTIN Take 100 mg by mouth daily.   OMEGA 3 PO Take 1 capsule by mouth 2 (two) times daily.   psyllium 95 % Pack Commonly known as: HYDROCIL/METAMUCIL Take 1 packet by mouth daily.   tiZANidine 4 MG tablet Commonly known as: ZANAFLEX Take 2 mg by mouth every 8 (eight) hours as needed.   traZODone 50 MG tablet Commonly known as: DESYREL Take 50 mg by mouth at bedtime as needed for sleep.   Vitamin D3 25 MCG (1000 UT) Caps Take 1,000 Units by mouth at bedtime.        Allergies:  Allergies  Allergen Reactions   Nsaids     NOT TO TAKE DUE TO RENAL DISEASE    Family History: Family History  Problem Relation Age of Onset   Breast cancer Mother 50   Breast cancer Sister    Breast cancer Maternal Aunt 60   Bladder Cancer Neg Hx    Kidney cancer Neg Hx    Prostate cancer Neg Hx     Social History:  reports that she quit smoking about 26 years ago. Her smoking use included cigarettes. She has never used smokeless tobacco. She reports that she does not drink alcohol and does not use drugs.  ROS: Pertinent ROS in HPI  Physical Exam: There were no vitals taken for this visit.  Constitutional:  Well nourished. Alert and oriented, No acute distress. HEENT: [place] AT, moist mucus membranes.  Trachea midline, no masses. Cardiovascular: No clubbing, cyanosis, or edema. Respiratory: Normal respiratory effort, no increased work of breathing. GU: No CVA tenderness.  No bladder fullness or masses.  Recession of labia minora, dry, pale vulvar vaginal mucosa and loss of mucosal ridges and folds.  Normal urethral meatus, no lesions, no prolapse, no discharge.   No urethral masses, tenderness and/or tenderness. No bladder fullness, tenderness or masses. *** vagina mucosa, *** estrogen effect, no discharge, no lesions, *** pelvic support, *** cystocele and  *** rectocele noted.  No cervical motion tenderness.  Uterus is freely mobile and non-fixed.  No adnexal/parametria masses or tenderness noted.  Anus and perineum are without rashes or lesions.   ***  Neurologic: Grossly intact, no focal deficits, moving all 4 extremities. Psychiatric: Normal mood and affect.    Laboratory Data: ontains abnormal data Urinalysis w/Microscopic Order: [id number] Component Ref Range & Units 09:28  Color Colorless, Straw, Light Yellow, Yellow, Dark Yellow Dark Yellow  Clarity Clear Cloudy Abnormal   Specific Gravity 1.005 - 1.030 1.018  pH, Urine 5.0 - 8.0 6  Protein, Urinalysis Negative mg/dL 1+ Abnormal   Glucose, Urinalysis Negative mg/dL Negative  Ketones, Urinalysis Negative mg/dL Negative  Blood, Urinalysis Negative Negative  Nitrite, Urinalysis Negative Positive Abnormal   Leukocyte Esterase, Urinalysis Negative 3+  Abnormal   Bilirubin, Urinalysis Negative Negative  Urobilinogen, Urinalysis 0.2 - 1.0 mg/dL 0.2  WBC, UA <=5 /hpf >161 High   Red Blood Cells, Urinalysis <=3 /hpf 0  Bacteria, Urinalysis 0 - 5 /hpf 0-5  Squamous Epithelial Cells, Urinalysis /hpf 3  Resulting Agency [facility]   Specimen Collected: 09/18/23 09:28   Performed by: [facility] Last Resulted: 09/18/23 09:52  Received From: [facility]  Result Received: 09/18/23 12:35   Urinalysis See EPIC and HPI  I have reviewed the labs.   Pertinent Imaging: N/A  Assessment & Plan:    1. rUTI's - criteria for recurrent UTI has been met with 2 or more infections in 6 months or 3 or greater infections in one year  - patient is instructed to increase their water intake until the urine is pale yellow or clear (10 to 12 cups daily)  - patient is instructed to take probiotics (yogurt, oral pills or vaginal suppositories), take cranberry pills or drink the juice and Vitamin C 1,000 mg daily to acidify the urine  -  Asymptomatic at this visit - Her UA today is benign -For some reason, she will always need 10 days of antibiotics to clear her infection  2. Nocturia -Continue to manage conservatively, she does not find it bothersome                   3.  Vaginal estrogen cream -Will increase her dose from 2 nights weekly to 3 nights weekly                            No follow-ups on file.  These notes generated with voice recognition software. I apologize for typographical errors.  Cloretta Ned  [facility] [address]  [address] [place], [place] [address] [phone]

## 2023-09-20 ENCOUNTER — Ambulatory Visit: Admitting: Urology

## 2023-11-11 ENCOUNTER — Ambulatory Visit
Admission: RE | Admit: 2023-11-11 | Discharge: 2023-11-11 | Disposition: A | Discharge status: Home / Self Care | Source: Ambulatory Visit | Attending: Emergency Medicine | Admitting: Emergency Medicine

## 2023-11-11 VITALS — BP 144/84 | HR 83 | Temp 98.8°F

## 2023-11-11 DIAGNOSIS — N3000 Acute cystitis without hematuria: Secondary | ICD-10-CM | POA: Insufficient documentation

## 2023-11-11 DIAGNOSIS — R350 Frequency of micturition: Secondary | ICD-10-CM | POA: Diagnosis present

## 2023-11-11 LAB — POCT URINALYSIS DIP (MANUAL ENTRY)
Bilirubin, UA: NEGATIVE
Glucose, UA: NEGATIVE mg/dL
Ketones, POC UA: NEGATIVE mg/dL
Nitrite, UA: POSITIVE — AB
Protein Ur, POC: 100 mg/dL — AB
Spec Grav, UA: 1.015
Urobilinogen, UA: 2 U/dL — AB
pH, UA: 5.5

## 2023-11-11 MED ORDER — CEFDINIR 300 MG PO CAPS: 300.0000 mg | ORAL_CAPSULE | Freq: Two times a day (BID) | ORAL | 0 refills | Status: AC

## 2023-11-11 MED ORDER — FLUCONAZOLE 150 MG PO TABS: 150.0000 mg | ORAL_TABLET | ORAL | 0 refills | Status: AC

## 2023-11-11 NOTE — ED Triage Notes (Signed)
 Have frequently. Symptoms began Thursday. Pain on urination

## 2023-11-11 NOTE — Discharge Instructions (Signed)
 Your urinalysis shows Patricia Lucas blood cells and nitrates which are indicative of infection, your urine will be sent to the lab to determine exactly which bacteria is present, if any changes need to be made to your medications you will be notified  Begin use of cefdinir twice daily for 7 days  You may use over-the-counter Azo to help minimize your symptoms until antibiotic removes bacteria, this medication will turn your urine orange  Increase your fluid intake through use of water  As always practice good hygiene, wiping front to back and avoidance of scented vaginal products to prevent further irritation  If symptoms continue to persist after use of medication or recur please follow-up with urgent care or your primary doctor as needed

## 2023-11-11 NOTE — ED Provider Notes (Signed)
 Arlander Bellman    CSN: [id number] Arrival date & time: 11/11/23  1227      History   Chief Complaint Chief Complaint  Patient presents with   Urinary Frequency    Probable uti. Have frequently. Symptoms began Thursday. Pain on urination - Entered by patient   Dysuria    HPI Patricia Lucas is a 83 y.o. female.   Patient presents for evaluation of urinary frequency and dysuria present for 3 days.  Also experiencing slight vaginal itching which she endorses occurs during times of UTI.  Experiencing chills overnight.  Denies abdominal, flank pain, hematuria fever, vaginal discharge or odor.  Past Medical History:  Diagnosis Date   Chronic cystitis    Chronic kidney disease    enhanced nsaid use (for psoriatic arthritis) caused renal disease.   Colon polyp    Complication of anesthesia 2017   vomitting with waking up after colonoscopy   Diabetes mellitus without complication (HCC)    Generalized OA    GERD (gastroesophageal reflux disease)    Hypertension    Osteoporosis    Psoriasis    Psoriatic arthritis (HCC)    UTI (lower urinary tract infection)     Patient Active Problem List   Diagnosis Date Noted   Acute colitis 07/14/2022   HLD (hyperlipidemia) 07/14/2022   Type II diabetes mellitus with renal manifestations (HCC) 07/14/2022   Chronic kidney disease, stage 3a (HCC) 07/14/2022   Severe sepsis (HCC) 07/14/2022   Normocytic anemia 07/14/2022   Essential hypertension 09/27/2020   Hypercholesterolemia 01/16/2020   Degenerative tear of glenoid labrum of left shoulder 08/15/2018   Rotator cuff tendinitis, left 07/22/2018   Nontraumatic complete tear of left rotator cuff 07/22/2018   Dyspepsia 07/02/2015   Fever of unknown origin 02/21/2015   Hyponatremia 02/19/2015   Ehrlichiosis 02/01/2015   H/O adenomatous polyp of colon 05/07/2014   Diabetes mellitus type 2, uncomplicated (HCC) 03/23/2014   Psoriatic arthritis (HCC) 10/08/2013   Psoriasis  10/08/2013   Post-menopausal osteoporosis 10/08/2013   Osteoarthritis 10/08/2013   Other chronic cystitis without hematuria 05/13/2012   Increased frequency of urination 05/13/2012   Urge incontinence 12/12/2010   Midline cystocele 12/12/2010   Urinary incontinence 09/27/2010   Genital prolapse 09/27/2010   Urinary tract infection 09/27/2010    Past Surgical History:  Procedure Laterality Date   ABDOMINAL HYSTERECTOMY  1969   APPENDECTOMY     BLADDER SUSPENSION  2010   CATARACT EXTRACTION, BILATERAL     COLONOSCOPY WITH PROPOFOL  N/A 12/29/2022   Procedure: COLONOSCOPY WITH PROPOFOL ;  Surgeon: Shane Darling, MD;  Location: [facility];  Service: Endoscopy;  Laterality: N/A;   fibula fract     FLEXIBLE SIGMOIDOSCOPY N/A 07/14/2022   Procedure: FLEXIBLE SIGMOIDOSCOPY;  Surgeon: Shane Darling, MD;  Location: [facility];  Service: Endoscopy;  Laterality: N/A;   OVARIAN CYST REMOVAL  2015   POLYPECTOMY  12/29/2022   Procedure: POLYPECTOMY;  Surgeon: Shane Darling, MD;  Location: [facility];  Service: Endoscopy;;   SHOULDER ARTHROSCOPY WITH SUBACROMIAL DECOMPRESSION AND BICEP TENDON REPAIR Left 08/15/2018   Procedure: SHOULDER ARTHROSCOPY WITH DEBRIDEMENT, DECOMPRESSION, ROTATOR CUFF REPAIR AND BICEP TENODESIS-LEFT;  Surgeon: Elner Hahn, MD;  Location: [facility];  Service: Orthopedics;  Laterality: Left;    OB History   No obstetric history on file.      Home Medications    Prior to Admission medications   Medication Sig Start Date End Date Taking? Authorizing Provider  atorvastatin  (LIPITOR)  20 MG tablet Take 20 mg by mouth daily. 04/12/21  Yes [provider]  Biotin  w/ Vitamins C & E (HAIR/SKIN/NAILS PO) Take 1 tablet by mouth daily.   Yes [provider]  cefdinir (OMNICEF) 300 MG capsule Take 1 capsule (300 mg total) by mouth 2 (two) times daily for 7 days. 11/11/23 11/18/23 Yes Azalie Harbeck R, NP  Cholecalciferol  (VITAMIN D3)  1000 UNITS CAPS Take 1,000 Units by mouth at bedtime.   Yes [provider]  clobetasol  cream (TEMOVATE ) 0.05 % Apply 1 application topically daily as needed (inflammation).   Yes [provider]  Cranberry 450 MG TABS Take 450 mg by mouth daily.    Yes [provider]  estradiol  (ESTRACE  VAGINAL) 0.1 MG/GM vaginal cream Apply 0.5mg  (pea-sized amount)  just inside the vaginal introitus with a finger-tip on Monday, Wednesday and Friday nights. 08/01/23  Yes McGowan, Cathleen Coach A, PA-C  fluconazole  (DIFLUCAN ) 150 MG tablet Take 1 tablet (150 mg total) by mouth once a week for 2 doses. 11/11/23 11/19/23 Yes Suresh Audi R, NP  gabapentin  (NEURONTIN ) 100 MG capsule Take 100 mg by mouth at bedtime. 03/30/21  Yes [provider]  inFLIXimab (REMICADE) 100 MG injection Inject 400 mg into the muscle every 8 (eight) weeks. Next dose due 08/20/2018 05/13/12  Yes [provider]  lisinopril  (ZESTRIL ) 10 MG tablet Take 10 mg by mouth daily. 03/30/21  Yes [provider]  MAGNESIUM  OXIDE PO Take 500 mg by mouth daily.    Yes [provider]  metFORMIN (GLUCOPHAGE) 500 MG tablet Take 500 mg by mouth 2 (two) times daily.   Yes [provider]  metoprolol  succinate (TOPROL -XL) 50 MG 24 hr tablet Take 50 mg by mouth 2 (two) times daily. 02/23/21  Yes [provider]  nitrofurantoin (MACRODANTIN) 100 MG capsule Take 100 mg by mouth daily.   Yes [provider]  Omega-3 Fatty Acids (OMEGA 3 PO) Take 1 capsule by mouth 2 (two) times daily.    Yes [provider]  psyllium (HYDROCIL/METAMUCIL) 95 % PACK Take 1 packet by mouth daily. 07/18/22  Yes Melvinia Stager, MD  tiZANidine  (ZANAFLEX ) 4 MG tablet Take 2 mg by mouth every 8 (eight) hours as needed. 03/31/21  Yes [provider]  traZODone  (DESYREL ) 50 MG tablet Take 50 mg by mouth at bedtime as needed for sleep.  05/06/18  Yes [provider]    Family  History Family History  Problem Relation Age of Onset   Breast cancer Mother 65   Breast cancer Sister    Breast cancer Maternal Aunt 60   Bladder Cancer Neg Hx    Kidney cancer Neg Hx    Prostate cancer Neg Hx     Social History Social History   Tobacco Use   Smoking status: Former    Current packs/day: 0.00    Types: Cigarettes    Quit date: 1999    Years since quitting: 26.3   Smokeless tobacco: Never  Vaping Use   Vaping status: Never Used  Substance Use Topics   Alcohol use: No    Alcohol/week: 0.0 standard drinks of alcohol   Drug use: No     Allergies   Nsaids   Review of Systems Review of Systems  Genitourinary:  Positive for dysuria and frequency.     Physical Exam Triage Vital Signs ED Triage Vitals  Encounter Vitals Group     BP 11/11/23 1239 (!) 144/84     Systolic  BP Percentile --      Diastolic BP Percentile --      Pulse Rate 11/11/23 1239 83     Resp --      Temp 11/11/23 1239 98.8 F (37.1 C)     Temp Source 11/11/23 1239 Oral     SpO2 11/11/23 1239 94 %     Weight --      Height --      Head Circumference --      Peak Flow --      Pain Score 11/11/23 1238 5     Pain Loc --      Pain Education --      Exclude from Growth Chart --    No data found.  Updated Vital Signs BP (!) 144/84 (BP Location: Left Arm)   Pulse 83   Temp 98.8 F (37.1 C) (Oral)   SpO2 94%   Visual Acuity Right Eye Distance:   Left Eye Distance:   Bilateral Distance:    Right Eye Near:   Left Eye Near:    Bilateral Near:     Physical Exam Constitutional:      Appearance: Normal appearance.  Eyes:     Extraocular Movements: Extraocular movements intact.  Pulmonary:     Effort: Pulmonary effort is normal.  Abdominal:     Tenderness: There is no abdominal tenderness. There is no right CVA tenderness, left CVA tenderness or guarding.  Neurological:     Mental Status: She is alert and oriented to person, place, and time.      UC Treatments /  Results  Labs (all labs ordered are listed, but only abnormal results are displayed) Labs Reviewed  POCT URINALYSIS DIP (MANUAL ENTRY) - Abnormal; Notable for the following components:      Result Value   Color, UA light yellow (*)    Blood, UA trace-intact (*)    Protein Ur, POC =100 (*)    Urobilinogen, UA 2.0 (*)    Nitrite, UA Positive (*)    Leukocytes, UA Moderate (2+) (*)    All other components within normal limits  URINE CULTURE    EKG   Radiology No results found.  Procedures Procedures (including critical care time)  Medications Ordered in UC Medications - No data to display  Initial Impression / Assessment and Plan / UC Course  I have reviewed the triage vital signs and the nursing notes.  Pertinent labs & imaging results that were available during my care of the patient were reviewed by me and considered in my medical decision making (see chart for details).  Acute Cystitis without hematuria, urinary frequency  Urinalysis showing leukocytes and nitrates, sent for culture, last culture available March 2025, showing E. coli, Klebsiella pneumoniae and Proteus Mirabilis, antibiotic chosen from susceptibility report, prescribed cefdinir, has had success within the past, recommended supportive measures with follow-up as needed Final Clinical Impressions(s) / UC Diagnoses   Final diagnoses:  Urinary frequency  Acute cystitis without hematuria   Discharge Instructions      Your urinalysis shows Patricia Lucas blood cells and nitrates which are indicative of infection, your urine will be sent to the lab to determine exactly which bacteria is present, if any changes need to be made to your medications you will be notified  Begin use of cefdinir twice daily for 7 days  You may use over-the-counter Azo to help minimize your symptoms until antibiotic removes bacteria, this medication will turn your urine orange  Increase your fluid  intake through use of water  As always  practice good hygiene, wiping front to back and avoidance of scented vaginal products to prevent further irritation  If symptoms continue to persist after use of medication or recur please follow-up with urgent care or your primary doctor as needed   ED Prescriptions     Medication Sig Dispense Auth. Provider   cefdinir (OMNICEF) 300 MG capsule Take 1 capsule (300 mg total) by mouth 2 (two) times daily for 7 days. 14 capsule Sheniya Garciaperez R, NP   fluconazole  (DIFLUCAN ) 150 MG tablet Take 1 tablet (150 mg total) by mouth once a week for 2 doses. 2 tablet Jashua Knaak R, NP      PDMP not reviewed this encounter.   Reena Canning, NP 11/11/23 1309

## 2023-11-13 ENCOUNTER — Ambulatory Visit (HOSPITAL_COMMUNITY): Payer: Self-pay

## 2023-11-13 LAB — URINE CULTURE: Culture: >=100000 — AB

## 2023-12-25 ENCOUNTER — Ambulatory Visit
Admission: RE | Admit: 2023-12-25 | Discharge: 2023-12-25 | Disposition: A | Discharge status: Home / Self Care | Source: Ambulatory Visit | Attending: Emergency Medicine | Admitting: Emergency Medicine

## 2023-12-25 VITALS — BP 145/85 | HR 74 | Temp 97.3°F | Resp 16

## 2023-12-25 DIAGNOSIS — N390 Urinary tract infection, site not specified: Secondary | ICD-10-CM | POA: Diagnosis not present

## 2023-12-25 DIAGNOSIS — R319 Hematuria, unspecified: Secondary | ICD-10-CM | POA: Insufficient documentation

## 2023-12-25 DIAGNOSIS — Z8619 Personal history of other infectious and parasitic diseases: Secondary | ICD-10-CM | POA: Diagnosis present

## 2023-12-25 LAB — POCT URINALYSIS DIP (MANUAL ENTRY)
Bilirubin, UA: NEGATIVE
Glucose, UA: NEGATIVE mg/dL
Nitrite, UA: POSITIVE — AB
Protein Ur, POC: 100 mg/dL — AB
Spec Grav, UA: 1.02 (ref 1.010–1.025)
Urobilinogen, UA: 0.2 U/dL
pH, UA: 5.5 (ref 5.0–8.0)

## 2023-12-25 MED ORDER — FLUCONAZOLE 150 MG PO TABS: 150.0000 mg | ORAL_TABLET | Freq: Every day | ORAL | 0 refills | Status: DC

## 2023-12-25 MED ORDER — CEFDINIR 300 MG PO CAPS: 300.0000 mg | ORAL_CAPSULE | Freq: Two times a day (BID) | ORAL | 0 refills | Status: AC

## 2023-12-25 NOTE — ED Triage Notes (Signed)
 Patient to Urgent Care with complaints of urinary frequency and dysuria.  Symptoms started Friday (worsened Sunday).  Dose of pyridium  on Sunday night.

## 2023-12-25 NOTE — Discharge Instructions (Addendum)
Take the antibiotic as directed.  The urine culture is pending.  We will call you if it shows the need to change or discontinue your antibiotic.    Follow up with your primary care provider or urologist.    

## 2023-12-25 NOTE — ED Provider Notes (Signed)
 Patricia Lucas    CSN: [id number] Arrival date & time: 12/25/23  1058      History   Chief Complaint Chief Complaint  Patient presents with   Urinary Frequency    HPI Patricia Lucas is a 84 y.o. female.  Patient presents with dysuria and urinary frequency x 3 days.  No fever, visible hematuria, abdominal pain, flank pain.  She took Azo a couple of days ago but none today.  Patient was seen at this urgent care on 11/11/2023 for acute cystitis; urine culture grew >100,000 colonies of E. coli that was resistant to several medications; she was treated with cefdinir  and Diflucan .  Patient's medical history includes recurrent UTI and she is followed by urology; last seen on 08/01/2023.  The history is provided by the patient and medical records.    Past Medical History:  Diagnosis Date   Chronic cystitis    Chronic kidney disease    enhanced nsaid use (for psoriatic arthritis) caused renal disease.   Colon polyp    Complication of anesthesia 2017   vomitting with waking up after colonoscopy   Diabetes mellitus without complication (HCC)    Generalized OA    GERD (gastroesophageal reflux disease)    Hypertension    Osteoporosis    Psoriasis    Psoriatic arthritis (HCC)    UTI (lower urinary tract infection)     Patient Active Problem List   Diagnosis Date Noted   Acute colitis 07/14/2022   HLD (hyperlipidemia) 07/14/2022   Type II diabetes mellitus with renal manifestations (HCC) 07/14/2022   Chronic kidney disease, stage 3a (HCC) 07/14/2022   Severe sepsis (HCC) 07/14/2022   Normocytic anemia 07/14/2022   Essential hypertension 09/27/2020   Hypercholesterolemia 01/16/2020   Degenerative tear of glenoid labrum of left shoulder 08/15/2018   Rotator cuff tendinitis, left 07/22/2018   Nontraumatic complete tear of left rotator cuff 07/22/2018   Dyspepsia 07/02/2015   Fever of unknown origin 02/21/2015   Hyponatremia 02/19/2015   Ehrlichiosis 02/01/2015   H/O  adenomatous polyp of colon 05/07/2014   Diabetes mellitus type 2, uncomplicated (HCC) 03/23/2014   Psoriatic arthritis (HCC) 10/08/2013   Psoriasis 10/08/2013   Post-menopausal osteoporosis 10/08/2013   Osteoarthritis 10/08/2013   Other chronic cystitis without hematuria 05/13/2012   Increased frequency of urination 05/13/2012   Urge incontinence 12/12/2010   Midline cystocele 12/12/2010   Urinary incontinence 09/27/2010   Genital prolapse 09/27/2010   Urinary tract infection 09/27/2010    Past Surgical History:  Procedure Laterality Date   ABDOMINAL HYSTERECTOMY  1969   APPENDECTOMY     BLADDER SUSPENSION  2010   CATARACT EXTRACTION, BILATERAL     COLONOSCOPY WITH PROPOFOL  N/A 12/29/2022   Procedure: COLONOSCOPY WITH PROPOFOL ;  Surgeon: Maryruth Ole DASEN, MD;  Location: [facility];  Service: Endoscopy;  Laterality: N/A;   fibula fract     FLEXIBLE SIGMOIDOSCOPY N/A 07/14/2022   Procedure: FLEXIBLE SIGMOIDOSCOPY;  Surgeon: Maryruth Ole DASEN, MD;  Location: [facility];  Service: Endoscopy;  Laterality: N/A;   OVARIAN CYST REMOVAL  2015   POLYPECTOMY  12/29/2022   Procedure: POLYPECTOMY;  Surgeon: Maryruth Ole DASEN, MD;  Location: [facility];  Service: Endoscopy;;   SHOULDER ARTHROSCOPY WITH SUBACROMIAL DECOMPRESSION AND BICEP TENDON REPAIR Left 08/15/2018   Procedure: SHOULDER ARTHROSCOPY WITH DEBRIDEMENT, DECOMPRESSION, ROTATOR CUFF REPAIR AND BICEP TENODESIS-LEFT;  Surgeon: Edie Norleen PARAS, MD;  Location: [facility];  Service: Orthopedics;  Laterality: Left;    OB History   No  obstetric history on file.      Home Medications    Prior to Admission medications   Medication Sig Start Date End Date Taking? Authorizing Provider  cefdinir  (OMNICEF ) 300 MG capsule Take 1 capsule (300 mg total) by mouth 2 (two) times daily for 7 days. 12/25/23 01/01/24 Yes Corlis Burnard DEL, NP  fluconazole  (DIFLUCAN ) 150 MG tablet Take 1 tablet (150 mg total) by mouth daily. Take one tablet  today.  May repeat in 3 days. 12/25/23  Yes Corlis Burnard DEL, NP  atorvastatin  (LIPITOR) 20 MG tablet Take 20 mg by mouth daily. 04/12/21   [provider]  Biotin  w/ Vitamins C & E (HAIR/SKIN/NAILS PO) Take 1 tablet by mouth daily.    [provider]  Cholecalciferol  (VITAMIN D3) 1000 UNITS CAPS Take 1,000 Units by mouth at bedtime.    [provider]  clobetasol  cream (TEMOVATE ) 0.05 % Apply 1 application topically daily as needed (inflammation).    [provider]  Cranberry 450 MG TABS Take 450 mg by mouth daily.     [provider]  estradiol  (ESTRACE  VAGINAL) 0.1 MG/GM vaginal cream Apply 0.5mg  (pea-sized amount)  just inside the vaginal introitus with a finger-tip on Monday, Wednesday and Friday nights. 08/01/23   Helon Kirsch A, PA-C  gabapentin  (NEURONTIN ) 100 MG capsule Take 100 mg by mouth at bedtime. 03/30/21   [provider]  inFLIXimab (REMICADE) 100 MG injection Inject 400 mg into the muscle every 8 (eight) weeks. Next dose due 08/20/2018 05/13/12   [provider]  lisinopril  (ZESTRIL ) 10 MG tablet Take 10 mg by mouth daily. 03/30/21   [provider]  MAGNESIUM  OXIDE PO Take 500 mg by mouth daily.     [provider]  metFORMIN (GLUCOPHAGE) 500 MG tablet Take 500 mg by mouth 2 (two) times daily.    [provider]  metoprolol  succinate (TOPROL -XL) 50 MG 24 hr tablet Take 50 mg by mouth 2 (two) times daily. 02/23/21   [provider]  nitrofurantoin (MACRODANTIN) 100 MG capsule Take 100 mg by mouth daily.    [provider]  Omega-3 Fatty Acids (OMEGA 3 PO) Take 1 capsule by mouth 2 (two) times daily.     [provider]  psyllium (HYDROCIL/METAMUCIL) 95 % PACK Take 1 packet by mouth daily. 07/18/22   Patel, Sona, MD  tiZANidine  (ZANAFLEX ) 4 MG tablet Take 2 mg by mouth every 8 (eight) hours as needed. 03/31/21   [provider]  traZODone  (DESYREL ) 50 MG tablet  Take 50 mg by mouth at bedtime as needed for sleep.  05/06/18   [provider]    Family History Family History  Problem Relation Age of Onset   Breast cancer Mother 70   Breast cancer Sister    Breast cancer Maternal Aunt 60   Bladder Cancer Neg Hx    Kidney cancer Neg Hx    Prostate cancer Neg Hx     Social History Social History   Tobacco Use   Smoking status: Former    Current packs/day: 0.00    Types: Cigarettes    Quit date: 1999    Years since quitting: 26.4   Smokeless tobacco: Never  Vaping Use   Vaping status: Never Used  Substance Use Topics   Alcohol use: No    Alcohol/week: 0.0 standard drinks of alcohol   Drug use: No     Allergies   Nsaids   Review of Systems Review of Systems  Constitutional:  Negative for chills and fever.  Gastrointestinal:  Negative for abdominal pain.  Genitourinary:  Positive for dysuria and frequency. Negative for flank pain and hematuria.     Physical Exam Triage Vital Signs ED Triage Vitals  Encounter Vitals Group     BP      Girls Systolic BP Percentile      Girls Diastolic BP Percentile      Boys Systolic BP Percentile      Boys Diastolic BP Percentile      Pulse      Resp      Temp      Temp src      SpO2      Weight      Height      Head Circumference      Peak Flow      Pain Score      Pain Loc      Pain Education      Exclude from Growth Chart    No data found.  Updated Vital Signs BP (!) 145/85   Pulse 74   Temp (!) 97.3 F (36.3 C)   Resp 16   SpO2 98%   Visual Acuity Right Eye Distance:   Left Eye Distance:   Bilateral Distance:    Right Eye Near:   Left Eye Near:    Bilateral Near:     Physical Exam Constitutional:      General: She is not in acute distress. HENT:     Mouth/Throat:     Mouth: Mucous membranes are moist.   Cardiovascular:     Rate and Rhythm: Normal rate and regular rhythm.  Pulmonary:     Effort: Pulmonary effort is normal. No respiratory  distress.  Abdominal:     Palpations: Abdomen is soft.     Tenderness: There is no abdominal tenderness. There is no right CVA tenderness, left CVA tenderness, guarding or rebound.   Neurological:     Mental Status: She is alert.      UC Treatments / Results  Labs (all labs ordered are listed, but only abnormal results are displayed) Labs Reviewed  POCT URINALYSIS DIP (MANUAL ENTRY) - Abnormal; Notable for the following components:      Result Value   Clarity, UA cloudy (*)    Ketones, POC UA trace (5) (*)    Blood, UA trace-intact (*)    Protein Ur, POC =100 (*)    Nitrite, UA Positive (*)    Leukocytes, UA Moderate (2+) (*)    All other components within normal limits  URINE CULTURE    EKG   Radiology No results found.  Procedures Procedures (including critical care time)  Medications Ordered in UC Medications - No data to display  Initial Impression / Assessment and Plan / UC Course  I have reviewed the triage vital signs and the nursing notes.  Pertinent labs & imaging results that were available during my care of the patient were reviewed by me and considered in my medical decision making (see chart for details).    UTI with microscopic hematuria, history of vaginal candidiasis with antibiotic use..  Afebrile, VSS.  Treating with cefdinir  based on last urine culture. Urine culture pending today. Discussed with patient that we will call her if the urine culture shows the need to change or discontinue the antibiotic.  Also treating with Diflucan  as patient reports history of vaginal candidiasis with antibiotic use.  Instructed her to follow-up with her PCP  or urologist. Patient agrees to plan of care.     Final Clinical Impressions(s) / UC Diagnoses   Final diagnoses:  Urinary tract infection with hematuria, site unspecified  History of candidiasis of vagina     Discharge Instructions      Take the antibiotic as directed.  The urine culture is pending.  We  will call you if it shows the need to change or discontinue your antibiotic.    Follow-up with your primary care provider or urologist.      ED Prescriptions     Medication Sig Dispense Auth. Provider   cefdinir  (OMNICEF ) 300 MG capsule Take 1 capsule (300 mg total) by mouth 2 (two) times daily for 7 days. 14 capsule Corlis Sor H, NP   fluconazole  (DIFLUCAN ) 150 MG tablet Take 1 tablet (150 mg total) by mouth daily. Take one tablet today.  May repeat in 3 days. 2 tablet Corlis Sor DEL, NP      PDMP not reviewed this encounter.   Corlis Sor DEL, NP 12/25/23 1123

## 2023-12-27 ENCOUNTER — Ambulatory Visit (HOSPITAL_COMMUNITY): Payer: Self-pay

## 2023-12-27 LAB — URINE CULTURE: Culture: >=100000 — AB

## 2024-01-18 ENCOUNTER — Ambulatory Visit
Admission: RE | Admit: 2024-01-18 | Discharge: 2024-01-18 | Disposition: A | Discharge status: Home / Self Care | Payer: Self-pay | Source: Ambulatory Visit | Attending: Emergency Medicine | Admitting: Emergency Medicine

## 2024-01-18 VITALS — BP 164/89 | HR 77 | Temp 98.5°F | Resp 20 | Ht 61.0 in | Wt 143.0 lb

## 2024-01-18 DIAGNOSIS — R350 Frequency of micturition: Secondary | ICD-10-CM | POA: Insufficient documentation

## 2024-01-18 LAB — POCT URINALYSIS DIP (MANUAL ENTRY)
Bilirubin, UA: NEGATIVE
Blood, UA: NEGATIVE
Glucose, UA: NEGATIVE mg/dL
Nitrite, UA: NEGATIVE
Protein Ur, POC: >=300 mg/dL — AB
Spec Grav, UA: 1.02 (ref 1.010–1.025)
Urobilinogen, UA: 0.2 U/dL
pH, UA: 6 (ref 5.0–8.0)

## 2024-01-18 MED ORDER — CEFDINIR 300 MG PO CAPS: 300.0000 mg | ORAL_CAPSULE | Freq: Two times a day (BID) | ORAL | 0 refills | Status: AC

## 2024-01-18 NOTE — Discharge Instructions (Addendum)
 Your urinalysis at this time does not show bacteria, your urine will be sent to the lab to determine exactly which bacteria is present, if any changes need to be made to your medications you will be notified  Begin use of cefdinir  twice daily for 7 days  You may use over-the-counter Azo to help minimize your symptoms until antibiotic removes bacteria, this medication will turn your urine orange  Increase your fluid intake through use of water  As always practice good hygiene, wiping front to back and avoidance of scented vaginal products to prevent further irritation  If symptoms continue to persist after use of medication or recur please follow-up with urgent care or your primary doctor as needed

## 2024-01-18 NOTE — ED Provider Notes (Signed)
 [name]    CSN: [id number] Arrival date & time: 01/18/24  1106      History   Chief Complaint Chief Complaint  Patient presents with   Urinary Frequency    Entered by patient    HPI Patricia Lucas is a 83 y.o. female.   Patient presents for evaluation of urinary frequency and dysuria beginning 2 days ago.  Has not attempted treatment.  Denies abdominal or flank pain, fever, hematuria or vaginal symptoms.  Past Medical History:  Diagnosis Date   Chronic cystitis    Chronic kidney disease    enhanced nsaid use (for psoriatic arthritis) caused renal disease.   Colon polyp    Complication of anesthesia 2017   vomitting with waking up after colonoscopy   Diabetes mellitus without complication (HCC)    Generalized OA    GERD (gastroesophageal reflux disease)    Hypertension    Osteoporosis    Psoriasis    Psoriatic arthritis (HCC)    UTI (lower urinary tract infection)     Patient Active Problem List   Diagnosis Date Noted   Acute colitis 07/14/2022   HLD (hyperlipidemia) 07/14/2022   Type II diabetes mellitus with renal manifestations (HCC) 07/14/2022   Chronic kidney disease, stage 3a (HCC) 07/14/2022   Severe sepsis (HCC) 07/14/2022   Normocytic anemia 07/14/2022   Essential hypertension 09/27/2020   Hypercholesterolemia 01/16/2020   Degenerative tear of glenoid labrum of left shoulder 08/15/2018   Rotator cuff tendinitis, left 07/22/2018   Nontraumatic complete tear of left rotator cuff 07/22/2018   Dyspepsia 07/02/2015   Fever of unknown origin 02/21/2015   Hyponatremia 02/19/2015   Ehrlichiosis 02/01/2015   H/O adenomatous polyp of colon 05/07/2014   Diabetes mellitus type 2, uncomplicated (HCC) 03/23/2014   Psoriatic arthritis (HCC) 10/08/2013   Psoriasis 10/08/2013   Post-menopausal osteoporosis 10/08/2013   Osteoarthritis 10/08/2013   Other chronic cystitis without hematuria 05/13/2012   Increased frequency of urination 05/13/2012    Urge incontinence 12/12/2010   Midline cystocele 12/12/2010   Urinary incontinence 09/27/2010   Genital prolapse 09/27/2010   Urinary tract infection 09/27/2010    Past Surgical History:  Procedure Laterality Date   ABDOMINAL HYSTERECTOMY  1969   APPENDECTOMY     BLADDER SUSPENSION  2010   CATARACT EXTRACTION, BILATERAL     COLONOSCOPY WITH PROPOFOL  N/A 12/29/2022   Procedure: COLONOSCOPY WITH PROPOFOL ;  Surgeon: Maryruth Ole DASEN, MD;  Location: [facility];  Service: Endoscopy;  Laterality: N/A;   fibula fract     FLEXIBLE SIGMOIDOSCOPY N/A 07/14/2022   Procedure: FLEXIBLE SIGMOIDOSCOPY;  Surgeon: Maryruth Ole DASEN, MD;  Location: [facility];  Service: Endoscopy;  Laterality: N/A;   OVARIAN CYST REMOVAL  2015   POLYPECTOMY  12/29/2022   Procedure: POLYPECTOMY;  Surgeon: Maryruth Ole DASEN, MD;  Location: [facility];  Service: Endoscopy;;   SHOULDER ARTHROSCOPY WITH SUBACROMIAL DECOMPRESSION AND BICEP TENDON REPAIR Left 08/15/2018   Procedure: SHOULDER ARTHROSCOPY WITH DEBRIDEMENT, DECOMPRESSION, ROTATOR CUFF REPAIR AND BICEP TENODESIS-LEFT;  Surgeon: Edie Norleen PARAS, MD;  Location: [facility];  Service: Orthopedics;  Laterality: Left;    OB History   No obstetric history on file.      Home Medications    Prior to Admission medications   Medication Sig Start Date End Date Taking? Authorizing Provider  cefdinir  (OMNICEF ) 300 MG capsule Take 1 capsule (300 mg total) by mouth 2 (two) times daily for 7 days. 01/18/24 01/25/24 Yes Teresa Shelba SAUNDERS, NP  atorvastatin  (  LIPITOR) 20 MG tablet Take 20 mg by mouth daily. 04/12/21   [provider]  Biotin  w/ Vitamins C & E (HAIR/SKIN/NAILS PO) Take 1 tablet by mouth daily.    [provider]  Cholecalciferol  (VITAMIN D3) 1000 UNITS CAPS Take 1,000 Units by mouth at bedtime.    [provider]  clobetasol  cream (TEMOVATE ) 0.05 % Apply 1 application topically daily as needed (inflammation).    [provider]  Cranberry 450 MG TABS Take 450 mg by mouth daily.     [provider]  estradiol  (ESTRACE  VAGINAL) 0.1 MG/GM vaginal cream Apply 0.5mg  (pea-sized amount)  just inside the vaginal introitus with a finger-tip on Monday, Wednesday and Friday nights. 08/01/23   Helon Kirsch A, PA-C  fluconazole  (DIFLUCAN ) 150 MG tablet Take 1 tablet (150 mg total) by mouth daily. Take one tablet today.  May repeat in 3 days. 12/25/23   Corlis Burnard DEL, NP  gabapentin  (NEURONTIN ) 100 MG capsule Take 100 mg by mouth at bedtime. 03/30/21   [provider]  inFLIXimab (REMICADE) 100 MG injection Inject 400 mg into the muscle every 8 (eight) weeks. Next dose due 08/20/2018 05/13/12   [provider]  lisinopril  (ZESTRIL ) 10 MG tablet Take 10 mg by mouth daily. 03/30/21   [provider]  MAGNESIUM  OXIDE PO Take 500 mg by mouth daily.     [provider]  metFORMIN (GLUCOPHAGE) 500 MG tablet Take 500 mg by mouth 2 (two) times daily.    [provider]  metoprolol  succinate (TOPROL -XL) 50 MG 24 hr tablet Take 50 mg by mouth 2 (two) times daily. 02/23/21   [provider]  nitrofurantoin (MACRODANTIN) 100 MG capsule Take 100 mg by mouth daily.    [provider]  Omega-3 Fatty Acids (OMEGA 3 PO) Take 1 capsule by mouth 2 (two) times daily.     [provider]  psyllium (HYDROCIL/METAMUCIL) 95 % PACK Take 1 packet by mouth daily. 07/18/22   Patel, Sona, MD  tiZANidine  (ZANAFLEX ) 4 MG tablet Take 2 mg by mouth every 8 (eight) hours as needed. 03/31/21   [provider]  traZODone  (DESYREL ) 50 MG tablet Take 50 mg by mouth at bedtime as needed for sleep.  05/06/18   [provider]    Family History Family History  Problem Relation Age of Onset   Breast cancer Mother 18   Breast cancer Sister    Breast cancer Maternal Aunt 60   Bladder Cancer Neg Hx    Kidney cancer Neg Hx    Prostate cancer Neg Hx     Social  History Social History   Tobacco Use   Smoking status: Former    Current packs/day: 0.00    Types: Cigarettes    Quit date: 1999    Years since quitting: 26.5   Smokeless tobacco: Never  Vaping Use   Vaping status: Never Used  Substance Use Topics   Alcohol use: No    Alcohol/week: 0.0 standard drinks of alcohol   Drug use: No     Allergies   Nsaids   Review of Systems Review of Systems   Physical Exam Triage Vital Signs ED Triage Vitals  Encounter Vitals Group     BP 01/18/24 1125 (!) 164/89     Girls Systolic BP Percentile --      Girls Diastolic BP Percentile --      Boys Systolic BP Percentile --      Boys Diastolic BP  Percentile --      Pulse Rate 01/18/24 1125 77     Resp 01/18/24 1125 20     Temp 01/18/24 1125 98.5 F (36.9 C)     Temp Source 01/18/24 1125 Oral     SpO2 01/18/24 1125 95 %     Weight 01/18/24 1120 143 lb (64.9 kg)     Height 01/18/24 1120 5' 1 (1.549 m)     Head Circumference --      Peak Flow --      Pain Score 01/18/24 1120 3     Pain Loc --      Pain Education --      Exclude from Growth Chart --    No data found.  Updated Vital Signs BP (!) 164/89   Pulse 77   Temp 98.5 F (36.9 C) (Oral)   Resp 20   Ht 5' 1 (1.549 m)   Wt 143 lb (64.9 kg)   SpO2 95%   BMI 27.02 kg/m   Visual Acuity Right Eye Distance:   Left Eye Distance:   Bilateral Distance:    Right Eye Near:   Left Eye Near:    Bilateral Near:     Physical Exam Constitutional:      Appearance: Normal appearance.  Eyes:     Extraocular Movements: Extraocular movements intact.  Pulmonary:     Effort: Pulmonary effort is normal.  Abdominal:     Tenderness: There is no abdominal tenderness. There is no right CVA tenderness, left CVA tenderness or guarding.  Neurological:     Mental Status: She is alert and oriented to person, place, and time.      UC Treatments / Results  Labs (all labs ordered are listed, but only abnormal results are  displayed) Labs Reviewed  POCT URINALYSIS DIP (MANUAL ENTRY) - Abnormal; Notable for the following components:      Result Value   Clarity, UA cloudy (*)    Ketones, POC UA trace (5) (*)    Protein Ur, POC >=300 (*)    Leukocytes, UA Small (1+) (*)    All other components within normal limits  URINE CULTURE    EKG   Radiology No results found.  Procedures Procedures (including critical care time)  Medications Ordered in UC Medications - No data to display  Initial Impression / Assessment and Plan / UC Course  I have reviewed the triage vital signs and the nursing notes.  Pertinent labs & imaging results that were available during my care of the patient were reviewed by me and considered in my medical decision making (see chart for details).  Urinary frequency  Urinalysis showing leukocytes, negative for nitrates, initiating antibiotics as she is symptomatic also has history of reoccurring UTI, will attempt use of cefdinir  as patient has had success with this medicine in the past, recommended supportive care with follow-up as needed Final Clinical Impressions(s) / UC Diagnoses   Final diagnoses:  Urinary frequency     Discharge Instructions      Your urinalysis at this time does not show bacteria, your urine will be sent to the lab to determine exactly which bacteria is present, if any changes need to be made to your medications you will be notified  Begin use of cefdinir  twice daily for 7 days  You may use over-the-counter Azo to help minimize your symptoms until antibiotic removes bacteria, this medication will turn your urine orange  Increase your fluid intake through use of water  As always practice good hygiene, wiping front to back and avoidance of scented vaginal products to prevent further irritation  If symptoms continue to persist after use of medication or recur please follow-up with urgent care or your primary doctor as needed    ED Prescriptions      Medication Sig Dispense Auth. Provider   cefdinir  (OMNICEF ) 300 MG capsule Take 1 capsule (300 mg total) by mouth 2 (two) times daily for 7 days. 14 capsule Iyauna Sing, Shelba SAUNDERS, NP      PDMP not reviewed this encounter.   Teresa Shelba SAUNDERS, NP 01/18/24 1135

## 2024-01-18 NOTE — ED Triage Notes (Signed)
 Patient in office today complaint of possible UTI x2d painful urination,frequency  OTC: none  Denies: N/V, Fever

## 2024-01-20 LAB — URINE CULTURE: Culture: >=100000 — AB

## 2024-01-21 ENCOUNTER — Ambulatory Visit (HOSPITAL_COMMUNITY): Payer: Self-pay

## 2024-01-21 MED ORDER — FLUCONAZOLE 150 MG PO TABS: 150.0000 mg | ORAL_TABLET | Freq: Once | ORAL | 0 refills | Status: AC

## 2024-02-22 ENCOUNTER — Ambulatory Visit
Admission: RE | Admit: 2024-02-22 | Discharge: 2024-02-22 | Disposition: A | Discharge status: Home / Self Care | Payer: Self-pay | Attending: Emergency Medicine | Admitting: Emergency Medicine

## 2024-02-22 VITALS — BP 141/89 | HR 68 | Temp 97.8°F | Resp 18

## 2024-02-22 DIAGNOSIS — Z8619 Personal history of other infectious and parasitic diseases: Secondary | ICD-10-CM | POA: Diagnosis not present

## 2024-02-22 DIAGNOSIS — N390 Urinary tract infection, site not specified: Secondary | ICD-10-CM | POA: Diagnosis not present

## 2024-02-22 LAB — POCT URINE DIPSTICK
Bilirubin, UA: NEGATIVE
Glucose, UA: NEGATIVE mg/dL
Ketones, POC UA: NEGATIVE mg/dL
Nitrite, UA: POSITIVE — AB
Protein Ur, POC: 30 mg/dL — AB
Spec Grav, UA: >=1.03 — AB (ref 1.010–1.025)
Urobilinogen, UA: 0.2 U/dL
pH, UA: 6 (ref 5.0–8.0)

## 2024-02-22 MED ORDER — CEFDINIR 300 MG PO CAPS
300.0000 mg | ORAL_CAPSULE | Freq: Two times a day (BID) | ORAL | 0 refills | Status: AC
Start: 2024-02-22 — End: 2024-02-29

## 2024-02-22 MED ORDER — FLUCONAZOLE 150 MG PO TABS: 150.0000 mg | ORAL_TABLET | Freq: Once | ORAL | 0 refills | Status: AC

## 2024-02-22 NOTE — ED Triage Notes (Signed)
 Patient to Urgent Care with complaints of urinary frequency/ dysuria. Denies any fevers.  Symptoms started over the weekend and worsened on Tuesday.   No otc meds. Takes daily nitrofurantoin.

## 2024-02-22 NOTE — Discharge Instructions (Addendum)
 Please schedule an appointment with your urologist for your recurrent urinary tract infection.    Take the antibiotic as directed.  The urine culture is pending.  We will call you if it shows the need to change or discontinue your antibiotic.    Take the Diflucan  as directed.

## 2024-02-22 NOTE — ED Provider Notes (Signed)
 [name]    CSN: [id number] Arrival date & time: 02/22/24  1046      History   Chief Complaint Chief Complaint  Patient presents with   Urinary Frequency    Entered by patient    HPI Patricia Lucas is a 83 y.o. female.  Patient presents with 4-5 day history of dysuria and urinary frequency, worse x 2 days.  No fever, hematuria, abdominal pain, flank pain.  Patient's medical history includes recurrent UTIs.  She is on daily Macrobid.  Patient was seen at this urgent care on 01/18/2024; diagnosed with urinary frequency; urine positive for E. coli; treated with cefdinir .  Patient was seen here on 12/25/2023; diagnosed with UTI; urine positive for E. coli; treated with cefdinir .  Patient was seen at this urgent care on 11/11/2023; diagnosed with acute cystitis; urine positive for E. coli; treated with cefdinir .  Patient is followed by urology.  The history is provided by the patient and medical records.    Past Medical History:  Diagnosis Date   Chronic cystitis    Chronic kidney disease    enhanced nsaid use (for psoriatic arthritis) caused renal disease.   Colon polyp    Complication of anesthesia 2017   vomitting with waking up after colonoscopy   Diabetes mellitus without complication (HCC)    Generalized OA    GERD (gastroesophageal reflux disease)    Hypertension    Osteoporosis    Psoriasis    Psoriatic arthritis (HCC)    UTI (lower urinary tract infection)     Patient Active Problem List   Diagnosis Date Noted   Acute colitis 07/14/2022   HLD (hyperlipidemia) 07/14/2022   Type II diabetes mellitus with renal manifestations (HCC) 07/14/2022   Chronic kidney disease, stage 3a (HCC) 07/14/2022   Severe sepsis (HCC) 07/14/2022   Normocytic anemia 07/14/2022   Essential hypertension 09/27/2020   Hypercholesterolemia 01/16/2020   Degenerative tear of glenoid labrum of left shoulder 08/15/2018   Rotator cuff tendinitis, left 07/22/2018   Nontraumatic  complete tear of left rotator cuff 07/22/2018   Dyspepsia 07/02/2015   Fever of unknown origin 02/21/2015   Hyponatremia 02/19/2015   Ehrlichiosis 02/01/2015   H/O adenomatous polyp of colon 05/07/2014   Diabetes mellitus type 2, uncomplicated (HCC) 03/23/2014   Psoriatic arthritis (HCC) 10/08/2013   Psoriasis 10/08/2013   Post-menopausal osteoporosis 10/08/2013   Osteoarthritis 10/08/2013   Other chronic cystitis without hematuria 05/13/2012   Increased frequency of urination 05/13/2012   Urge incontinence 12/12/2010   Midline cystocele 12/12/2010   Urinary incontinence 09/27/2010   Genital prolapse 09/27/2010   Urinary tract infection 09/27/2010    Past Surgical History:  Procedure Laterality Date   ABDOMINAL HYSTERECTOMY  1969   APPENDECTOMY     BLADDER SUSPENSION  2010   CATARACT EXTRACTION, BILATERAL     COLONOSCOPY WITH PROPOFOL  N/A 12/29/2022   Procedure: COLONOSCOPY WITH PROPOFOL ;  Surgeon: Maryruth Ole DASEN, MD;  Location: [facility];  Service: Endoscopy;  Laterality: N/A;   fibula fract     FLEXIBLE SIGMOIDOSCOPY N/A 07/14/2022   Procedure: FLEXIBLE SIGMOIDOSCOPY;  Surgeon: Maryruth Ole DASEN, MD;  Location: [facility];  Service: Endoscopy;  Laterality: N/A;   OVARIAN CYST REMOVAL  2015   POLYPECTOMY  12/29/2022   Procedure: POLYPECTOMY;  Surgeon: Maryruth Ole DASEN, MD;  Location: [facility];  Service: Endoscopy;;   SHOULDER ARTHROSCOPY WITH SUBACROMIAL DECOMPRESSION AND BICEP TENDON REPAIR Left 08/15/2018   Procedure: SHOULDER ARTHROSCOPY WITH DEBRIDEMENT, DECOMPRESSION, ROTATOR CUFF  REPAIR AND BICEP TENODESIS-LEFT;  Surgeon: Edie Norleen PARAS, MD;  Location: [facility];  Service: Orthopedics;  Laterality: Left;    OB History   No obstetric history on file.      Home Medications    Prior to Admission medications   Medication Sig Start Date End Date Taking? Authorizing Provider  cefdinir  (OMNICEF ) 300 MG capsule Take 1 capsule (300 mg total) by mouth 2  (two) times daily for 7 days. 02/22/24 02/29/24 Yes Corlis Burnard DEL, NP  fluconazole  (DIFLUCAN ) 150 MG tablet Take 1 tablet (150 mg total) by mouth once for 1 dose. 02/22/24 02/22/24 Yes Corlis Burnard DEL, NP  atorvastatin  (LIPITOR) 20 MG tablet Take 20 mg by mouth daily. 04/12/21   [provider]  Biotin  w/ Vitamins C & E (HAIR/SKIN/NAILS PO) Take 1 tablet by mouth daily.    [provider]  Cholecalciferol  (VITAMIN D3) 1000 UNITS CAPS Take 1,000 Units by mouth at bedtime.    [provider]  clobetasol  cream (TEMOVATE ) 0.05 % Apply 1 application topically daily as needed (inflammation).    [provider]  Cranberry 450 MG TABS Take 450 mg by mouth daily.     [provider]  estradiol  (ESTRACE  VAGINAL) 0.1 MG/GM vaginal cream Apply 0.5mg  (pea-sized amount)  just inside the vaginal introitus with a finger-tip on Monday, Wednesday and Friday nights. 08/01/23   Helon Kirsch A, PA-C  gabapentin  (NEURONTIN ) 100 MG capsule Take 100 mg by mouth at bedtime. 03/30/21   [provider]  inFLIXimab (REMICADE) 100 MG injection Inject 400 mg into the muscle every 8 (eight) weeks. Next dose due 08/20/2018 05/13/12   [provider]  lisinopril  (ZESTRIL ) 10 MG tablet Take 10 mg by mouth daily. 03/30/21   [provider]  MAGNESIUM  OXIDE PO Take 500 mg by mouth daily.     [provider]  metFORMIN (GLUCOPHAGE) 500 MG tablet Take 500 mg by mouth 2 (two) times daily.    [provider]  metoprolol  succinate (TOPROL -XL) 50 MG 24 hr tablet Take 50 mg by mouth 2 (two) times daily. 02/23/21   [provider]  nitrofurantoin (MACRODANTIN) 100 MG capsule Take 100 mg by mouth daily.    [provider]  Omega-3 Fatty Acids (OMEGA 3 PO) Take 1 capsule by mouth 2 (two) times daily.     [provider]  psyllium (HYDROCIL/METAMUCIL) 95 % PACK Take 1 packet by mouth daily. 07/18/22   Patel, Sona, MD  tiZANidine   (ZANAFLEX ) 4 MG tablet Take 2 mg by mouth every 8 (eight) hours as needed. 03/31/21   [provider]  traZODone  (DESYREL ) 50 MG tablet Take 50 mg by mouth at bedtime as needed for sleep.  05/06/18   [provider]    Family History Family History  Problem Relation Age of Onset   Breast cancer Mother 37   Breast cancer Sister    Breast cancer Maternal Aunt 60   Bladder Cancer Neg Hx    Kidney cancer Neg Hx    Prostate cancer Neg Hx     Social History Social History   Tobacco Use   Smoking status: Former    Current packs/day: 0.00    Types: Cigarettes    Quit date: 1999    Years since quitting: 26.6   Smokeless tobacco: Never  Vaping Use   Vaping status: Never Used  Substance Use Topics   Alcohol use: No    Alcohol/week: 0.0 standard drinks of alcohol  Drug use: No     Allergies   Nsaids   Review of Systems Review of Systems  Constitutional:  Negative for chills and fever.  Gastrointestinal:  Negative for abdominal pain.  Genitourinary:  Positive for dysuria and frequency. Negative for flank pain and hematuria.     Physical Exam Triage Vital Signs ED Triage Vitals  Encounter Vitals Group     BP      Girls Systolic BP Percentile      Girls Diastolic BP Percentile      Boys Systolic BP Percentile      Boys Diastolic BP Percentile      Pulse      Resp      Temp      Temp src      SpO2      Weight      Height      Head Circumference      Peak Flow      Pain Score      Pain Loc      Pain Education      Exclude from Growth Chart    No data found.  Updated Vital Signs BP (!) 141/89   Pulse 68   Temp 97.8 F (36.6 C)   Resp 18   SpO2 98%   Visual Acuity Right Eye Distance:   Left Eye Distance:   Bilateral Distance:    Right Eye Near:   Left Eye Near:    Bilateral Near:     Physical Exam Constitutional:      General: She is not in acute distress. HENT:     Mouth/Throat:     Mouth: Mucous membranes are moist.   Cardiovascular:     Rate and Rhythm: Normal rate and regular rhythm.  Pulmonary:     Effort: Pulmonary effort is normal. No respiratory distress.  Abdominal:     General: Bowel sounds are normal.     Palpations: Abdomen is soft.     Tenderness: There is no abdominal tenderness. There is no right CVA tenderness, left CVA tenderness, guarding or rebound.  Neurological:     Mental Status: She is alert.      UC Treatments / Results  Labs (all labs ordered are listed, but only abnormal results are displayed) Labs Reviewed  POCT URINE DIPSTICK - Abnormal; Notable for the following components:      Result Value   Spec Grav, UA >=1.030 (*)    Blood, UA trace-intact (*)    Protein Ur, POC =30 (*)    Nitrite, UA Positive (*)    Leukocytes, UA Small (1+) (*)    All other components within normal limits  URINE CULTURE    EKG   Radiology No results found.  Procedures Procedures (including critical care time)  Medications Ordered in UC Medications - No data to display  Initial Impression / Assessment and Plan / UC Course  I have reviewed the triage vital signs and the nursing notes.  Pertinent labs & imaging results that were available during my care of the patient were reviewed by me and considered in my medical decision making (see chart for details).    Recurrent UTI, history of vaginal yeast when taking antibiotics.  Patient has had recurrent UTIs monthly for the past 4 months.  Instructed her to please call her urologist to schedule an appointment as soon as possible.  Treating today with cefdinir  based on her most recent urine cultures.  Today's urine culture pending.  Education provided on UTI.  She agrees to plan of care.  Final Clinical Impressions(s) / UC Diagnoses   Final diagnoses:  Recurrent UTI  History of candidiasis of vagina     Discharge Instructions      Please schedule an appointment with your urologist for your recurrent urinary tract infection.     Take the antibiotic as directed.  The urine culture is pending.  We will call you if it shows the need to change or discontinue your antibiotic.    Take the Diflucan  as directed.        ED Prescriptions     Medication Sig Dispense Auth. Provider   cefdinir  (OMNICEF ) 300 MG capsule Take 1 capsule (300 mg total) by mouth 2 (two) times daily for 7 days. 14 capsule Corlis Burnard DEL, NP   fluconazole  (DIFLUCAN ) 150 MG tablet Take 1 tablet (150 mg total) by mouth once for 1 dose. 1 tablet Corlis Burnard DEL, NP      PDMP not reviewed this encounter.   Corlis Burnard DEL, NP 02/22/24 1110

## 2024-02-24 LAB — URINE CULTURE: Culture: >=100000 — AB

## 2024-02-25 ENCOUNTER — Ambulatory Visit (HOSPITAL_COMMUNITY): Payer: Self-pay

## 2024-02-28 ENCOUNTER — Telehealth: Payer: Self-pay | Admitting: Urology

## 2024-02-28 NOTE — Telephone Encounter (Signed)
 Pt called office to schedule appt ASAP with [facility].  She went to urgent care and is currently on antibiotics.  She takes the last one tomorrow.  She has had 5 UTI's since January and would like to see Clotilda to find out what's going on.  Can you please call and triage her?

## 2024-03-27 NOTE — Progress Notes (Signed)
 03/28/2024 7:05 PM   Patricia Lucas 1940/07/19 [id number]  Referring provider: Glover Lenis, MD [phone] Patricia Lucas [place],  [place] [address]  Urological history: 1. rUTI's -contributing factors of age, vaginal atrophy, bladder suspension (2010) -cysto (2023) NED  -contrast CT (07/2022) - no nidus for infections -documented urine cultures over the last year  - February 22, 2024 - E.coli - January 18, 2024 - E.coli - December 25, 2023 - E.coli - Nov 11, 2023 - E.coli - September 18, 2023 - E.coli - November 4, Klebsiella pneumoniae - April 15, 2023 - E.coli            -vaginal estrogen cream and cranberry tablets twice daily and macrobid 100 mg daily   2. Nocturia -contributing factors of age, hypertension, diabetes and arthritis  Chief Complaint  Patient presents with   Follow-up   HPI: Patricia Lucas is a 83 y.o.  woman presents today for rUTI's follow up.   Previous records reviewed.   She has had five UTI's since January.  Cultures were positive for E.coli.  Her symptoms with the UTI are feeling dizzy, dysuria and itching.  She is applying vaginal estrogen cream three nights weekly.    She is not sexually active.  She drinks a good amount of water and does not soak in a tub.    Today, she is not having any symptoms.  Patient denies any modifying or aggravating factors.  Patient denies any recent UTI's, gross hematuria, dysuria or suprapubic/flank pain.  Patient denies any fevers, chills, nausea or vomiting.    CATH UA bland  PMH: Past Medical History:  Diagnosis Date   Chronic cystitis    Chronic kidney disease    enhanced nsaid use (for psoriatic arthritis) caused renal disease.   Colon polyp    Complication of anesthesia 2017   vomitting with waking up after colonoscopy   Diabetes mellitus without complication (HCC)    Generalized OA    GERD (gastroesophageal reflux disease)    Hypertension    Osteoporosis    Psoriasis    Psoriatic arthritis (HCC)    UTI  (lower urinary tract infection)     Surgical History: Past Surgical History:  Procedure Laterality Date   ABDOMINAL HYSTERECTOMY  1969   APPENDECTOMY     BLADDER SUSPENSION  2010   CATARACT EXTRACTION, BILATERAL     COLONOSCOPY WITH PROPOFOL  N/A 12/29/2022   Procedure: COLONOSCOPY WITH PROPOFOL ;  Surgeon: Patricia Ole DASEN, MD;  Location: [facility];  Service: Endoscopy;  Laterality: N/A;   fibula fract     FLEXIBLE SIGMOIDOSCOPY N/A 07/14/2022   Procedure: FLEXIBLE SIGMOIDOSCOPY;  Surgeon: Patricia Ole DASEN, MD;  Location: [facility];  Service: Endoscopy;  Laterality: N/A;   OVARIAN CYST REMOVAL  2015   POLYPECTOMY  12/29/2022   Procedure: POLYPECTOMY;  Surgeon: Patricia Ole DASEN, MD;  Location: [facility];  Service: Endoscopy;;   SHOULDER ARTHROSCOPY WITH SUBACROMIAL DECOMPRESSION AND BICEP TENDON REPAIR Left 08/15/2018   Procedure: SHOULDER ARTHROSCOPY WITH DEBRIDEMENT, DECOMPRESSION, ROTATOR CUFF REPAIR AND BICEP TENODESIS-LEFT;  Surgeon: Patricia Norleen PARAS, MD;  Location: [facility];  Service: Orthopedics;  Laterality: Left;    Home Medications:  Allergies as of 03/28/2024       Reactions   Nsaids    NOT TO TAKE DUE TO RENAL DISEASE        Medication List        Accurate as of March 28, 2024 11:59 PM. If you have any questions,  ask your nurse or doctor.          atorvastatin  20 MG tablet Commonly known as: LIPITOR Take 20 mg by mouth daily.   cephALEXin  500 MG capsule Commonly known as: KEFLEX  Take 1 capsule (500 mg total) by mouth 2 (two) times daily for 10 days. Started by: Patricia Lucas   clobetasol  cream 0.05 % Commonly known as: TEMOVATE  Apply 1 application topically daily as needed (inflammation).   Cranberry 450 MG Tabs Take 450 mg by mouth daily.   estradiol  0.1 MG/GM vaginal cream Commonly known as: ESTRACE  VAGINAL Apply 0.5mg  (pea-sized amount)  just inside the vaginal introitus with a finger-tip on Monday, Wednesday and Friday  nights.   gabapentin  100 MG capsule Commonly known as: NEURONTIN  Take 100 mg by mouth at bedtime.   HAIR/SKIN/NAILS PO Take 1 tablet by mouth daily.   inFLIXimab 100 MG injection Commonly known as: REMICADE Inject 400 mg into the muscle every 8 (eight) weeks. Next dose due 08/20/2018   lisinopril  10 MG tablet Commonly known as: ZESTRIL  Take 10 mg by mouth daily.   MAGNESIUM  OXIDE PO Take 500 mg by mouth daily.   metFORMIN 500 MG tablet Commonly known as: GLUCOPHAGE Take 500 mg by mouth 2 (two) times daily.   metoprolol  succinate 50 MG 24 hr tablet Commonly known as: TOPROL -XL Take 50 mg by mouth 2 (two) times daily.   nitrofurantoin 100 MG capsule Commonly known as: MACRODANTIN Take 100 mg by mouth daily.   OMEGA 3 PO Take 1 capsule by mouth 2 (two) times daily.   psyllium 95 % Pack Commonly known as: HYDROCIL/METAMUCIL Take 1 packet by mouth daily.   tiZANidine  4 MG tablet Commonly known as: ZANAFLEX  Take 2 mg by mouth every 8 (eight) hours as needed.   traZODone  50 MG tablet Commonly known as: DESYREL  Take 50 mg by mouth at bedtime as needed for sleep.   Vitamin D3 25 MCG (1000 UT) Caps Take 1,000 Units by mouth at bedtime.        Allergies:  Allergies  Allergen Reactions   Nsaids     NOT TO TAKE DUE TO RENAL DISEASE    Family History: Family History  Problem Relation Age of Onset   Breast cancer Mother 41   Breast cancer Sister    Breast cancer Maternal Aunt 60   Bladder Cancer Neg Hx    Kidney cancer Neg Hx    Prostate cancer Neg Hx     Social History:  reports that she quit smoking about 26 years ago. Her smoking use included cigarettes. She has never used smokeless tobacco. She reports that she does not drink alcohol and does not use drugs.  ROS: Pertinent ROS in HPI  Physical Exam: BP (!) 150/81   Pulse 78   Ht 5' 1 (1.549 m)   Wt 143 lb (64.9 kg)   BMI 27.02 kg/m   Constitutional:  Well nourished. Alert and oriented, No acute  distress. HEENT: [place] AT, moist mucus membranes.  Trachea midline Cardiovascular: No clubbing, cyanosis, or edema. Respiratory: Normal respiratory effort, no increased work of breathing. Neurologic: Grossly intact, no focal deficits, moving all 4 extremities. Psychiatric: Normal mood and affect.   Laboratory Data: See EPIC and HPI  I have reviewed the labs.   Pertinent Imaging: N/A  Assessment & Plan:    1. rUTI's - asymptomatic at today's visit - catheterized UA was bland; urine sent for culture to evaluate for possible colonization - if culture grows bacteria  in the absence of symptoms, will consider this colonization rather than true infection - patient's UTI symptoms may be secondary to GSM - will schedule RUS to assess for occult pathology that may be contributing to recurrent symptoms - patient reports difficulty accessing timely appointments when she develops symptoms - provided prescription for Keflex  with instructions to only initiate therapy if she develops symptoms -patient instructed to notify provider if she starts Keflex  - will review renal ultrasound results once available   2. Nocturia -Continue to manage conservatively, she does not find it bothersome                   3.  Vaginal estrogen cream -Will increase her dose to every night for 30 days and then continue 3 nights weekly                            Return for I will call patient with results.  These notes generated with voice recognition software. I apologize for typographical errors.  CLOTILDA HELON RIGGERS  [facility], [facility] [address]  [address] [place], [place] [address] [phone]

## 2024-03-28 ENCOUNTER — Ambulatory Visit: Admitting: Urology

## 2024-03-28 VITALS — BP 150/81 | HR 78 | Ht 61.0 in | Wt 143.0 lb

## 2024-03-28 DIAGNOSIS — R351 Nocturia: Secondary | ICD-10-CM | POA: Diagnosis not present

## 2024-03-28 DIAGNOSIS — N390 Urinary tract infection, site not specified: Secondary | ICD-10-CM | POA: Diagnosis not present

## 2024-03-28 DIAGNOSIS — N952 Postmenopausal atrophic vaginitis: Secondary | ICD-10-CM | POA: Diagnosis not present

## 2024-03-28 LAB — URINALYSIS, COMPLETE
Bilirubin, UA: NEGATIVE
Glucose, UA: NEGATIVE
Ketones, UA: NEGATIVE
Leukocytes,UA: NEGATIVE
Nitrite, UA: NEGATIVE
Protein,UA: NEGATIVE
RBC, UA: NEGATIVE
Specific Gravity, UA: 1.01 (ref 1.005–1.030)
Urobilinogen, Ur: 0.2 mg/dL (ref 0.2–1.0)
pH, UA: 6 (ref 5.0–7.5)

## 2024-03-28 LAB — MICROSCOPIC EXAMINATION: Epithelial Cells (non renal): >10 /HPF — AB (ref 0–10)

## 2024-03-28 MED ORDER — CEPHALEXIN 500 MG PO CAPS: 500.0000 mg | ORAL_CAPSULE | Freq: Two times a day (BID) | ORAL | 0 refills | Status: AC

## 2024-03-28 NOTE — Progress Notes (Signed)
 In and Out Catheterization  Patient is present today for a I & O catheterization due to recurrent uti. Patient was cleaned and prepped in a sterile fashion with betadine . A 14FR cath was inserted no complications were noted , 60 ml of urine return was noted, urine was clear yellow  in color. A clean urine sample was collected for UA and CUX . Bladder was drained  And catheter was removed with out difficulty.    Performed by: Dyllen Menning Magallon-Mariche, RMA

## 2024-03-28 NOTE — Patient Instructions (Signed)
 We have ordered a kidney ultrasound and the scheduling department should reach out to you to schedule this appointment.  Sometimes, they have difficulty reaching patients because they are calling from an unknown number and many people have their phones set to ignore unknown numbers.  They do try to leave messages, but sometimes voicemails are not set up or mailboxes are full.  If you have not heard from the scheduling department in a week, please call [phone] to schedule your kidney ultrasound

## 2024-03-30 ENCOUNTER — Encounter: Payer: Self-pay | Admitting: Urology

## 2024-04-02 ENCOUNTER — Ambulatory Visit: Payer: Self-pay | Admitting: Urology

## 2024-04-02 ENCOUNTER — Ambulatory Visit
Admission: RE | Admit: 2024-04-02 | Discharge: 2024-04-02 | Disposition: A | Discharge status: Home / Self Care | Source: Ambulatory Visit | Attending: Urology | Admitting: Urology

## 2024-04-02 DIAGNOSIS — N390 Urinary tract infection, site not specified: Secondary | ICD-10-CM | POA: Diagnosis present

## 2024-04-02 LAB — CULTURE, URINE COMPREHENSIVE

## 2024-04-23 ENCOUNTER — Other Ambulatory Visit: Payer: Self-pay | Admitting: Surgery

## 2024-05-01 ENCOUNTER — Encounter
Admission: RE | Admit: 2024-05-01 | Discharge: 2024-05-01 | Disposition: A | Discharge status: Home / Self Care | Source: Ambulatory Visit | Attending: Surgery | Admitting: Surgery

## 2024-05-01 ENCOUNTER — Other Ambulatory Visit: Payer: Self-pay

## 2024-05-01 VITALS — BP 165/78 | HR 77 | Resp 16 | Wt 145.1 lb

## 2024-05-01 DIAGNOSIS — Z01812 Encounter for preprocedural laboratory examination: Secondary | ICD-10-CM | POA: Diagnosis present

## 2024-05-01 DIAGNOSIS — Z01818 Encounter for other preprocedural examination: Secondary | ICD-10-CM | POA: Insufficient documentation

## 2024-05-01 DIAGNOSIS — E119 Type 2 diabetes mellitus without complications: Secondary | ICD-10-CM

## 2024-05-01 DIAGNOSIS — Z01810 Encounter for preprocedural cardiovascular examination: Secondary | ICD-10-CM | POA: Diagnosis not present

## 2024-05-01 HISTORY — DX: Complete rotator cuff tear or rupture of right shoulder, not specified as traumatic: M75.121

## 2024-05-01 HISTORY — DX: Primary osteoarthritis, right shoulder: M19.011

## 2024-05-01 HISTORY — DX: Nausea with vomiting, unspecified: R11.2

## 2024-05-01 HISTORY — DX: Malignant (primary) neoplasm, unspecified: C80.1

## 2024-05-01 HISTORY — DX: Anemia, unspecified: D64.9

## 2024-05-01 HISTORY — DX: Pneumonia, unspecified organism: J18.9

## 2024-05-01 LAB — COMPREHENSIVE METABOLIC PANEL WITH GFR
ALT: 17 U/L (ref 0–44)
AST: 30 U/L (ref 15–41)
Albumin: 4.2 g/dL (ref 3.5–5.0)
Alkaline Phosphatase: 58 U/L (ref 38–126)
Anion gap: 11 (ref 5–15)
BUN: 23 mg/dL (ref 8–23)
CO2: 26 mmol/L (ref 22–32)
Calcium: 9.3 mg/dL (ref 8.9–10.3)
Chloride: 99 mmol/L (ref 98–111)
Creatinine, Ser: 1.06 mg/dL — ABNORMAL HIGH (ref 0.44–1.00)
GFR, Estimated: 52 mL/min — ABNORMAL LOW (ref >60)
Glucose, Bld: 89 mg/dL (ref 70–99)
Potassium: 4.4 mmol/L (ref 3.5–5.1)
Sodium: 136 mmol/L (ref 135–145)
Total Bilirubin: 0.5 mg/dL (ref 0.0–1.2)
Total Protein: 8.5 g/dL — ABNORMAL HIGH (ref 6.5–8.1)

## 2024-05-01 LAB — CBC WITH DIFFERENTIAL/PLATELET
Abs Immature Granulocytes: 0.02 K/uL (ref 0.00–0.07)
Basophils Absolute: 0.1 K/uL (ref 0.0–0.1)
Basophils Relative: 1 %
Eosinophils Absolute: 0.4 K/uL (ref 0.0–0.5)
Eosinophils Relative: 6 %
HCT: 33.8 % — ABNORMAL LOW (ref 36.0–46.0)
Hemoglobin: 10.7 g/dL — ABNORMAL LOW (ref 12.0–15.0)
Immature Granulocytes: 0 %
Lymphocytes Relative: 31 %
Lymphs Abs: 2 K/uL (ref 0.7–4.0)
MCH: 30.1 pg (ref 26.0–34.0)
MCHC: 31.7 g/dL (ref 30.0–36.0)
MCV: 94.9 fL (ref 80.0–100.0)
Monocytes Absolute: 0.7 K/uL (ref 0.1–1.0)
Monocytes Relative: 10 %
Neutro Abs: 3.3 K/uL (ref 1.7–7.7)
Neutrophils Relative %: 52 %
Platelets: 237 K/uL (ref 150–400)
RBC: 3.56 MIL/uL — ABNORMAL LOW (ref 3.87–5.11)
RDW: 12.8 % (ref 11.5–15.5)
WBC: 6.3 K/uL (ref 4.0–10.5)
nRBC: 0 % (ref 0.0–0.2)

## 2024-05-01 LAB — URINALYSIS, ROUTINE W REFLEX MICROSCOPIC
Bilirubin Urine: NEGATIVE
Glucose, UA: NEGATIVE mg/dL
Hgb urine dipstick: NEGATIVE
Ketones, ur: NEGATIVE mg/dL
Leukocytes,Ua: NEGATIVE
Nitrite: NEGATIVE
Protein, ur: 30 mg/dL — AB
Specific Gravity, Urine: 1.014 (ref 1.005–1.030)
pH: 6 (ref 5.0–8.0)

## 2024-05-01 LAB — SURGICAL PCR SCREEN
MRSA, PCR: NEGATIVE
Staphylococcus aureus: NEGATIVE

## 2024-05-01 NOTE — Patient Instructions (Addendum)
 Your procedure is scheduled on:05/08/24 - Thursday Report to the Registration Desk on the 1st floor of the [facility]. To find out your arrival time, please call [phone] between 1PM - 3PM on: 05/07/24 - Wednesday If your arrival time is 6:00 am, do not arrive before that time as the [facility] entrance doors do not open until 6:00 am.  REMEMBER: Instructions that are not followed completely may result in serious medical risk, up to and including death; or upon the discretion of your surgeon and anesthesiologist your surgery may need to be rescheduled.  Do not eat food after midnight the night before surgery.  No gum chewing or hard candies.  You may however, drink CLEAR liquids up to 2 hours before you are scheduled to arrive for your surgery. Do not drink anything within 2 hours of your scheduled arrival time.  Clear liquids include: - water   In addition, your doctor has ordered for you to drink the provided: Gatorade G2 Drinking this carbohydrate drink up to two hours before surgery helps to reduce insulin  resistance and improve patient outcomes. Please complete drinking 2 hours before scheduled arrival time.  One week prior to surgery: Stop Anti-inflammatories (NSAIDS) such as Advil, Aleve, Ibuprofen, Motrin, Naproxen, Naprosyn and Aspirin based products such as Excedrin, Goody's Powder, BC Powder. You may take Tylenol  if needed for pain up until the day of surgery.  Stop taking beginning 10/31, ANY OVER THE COUNTER supplements until after surgery : HAIR/SKIN/NAILS ,VITAMIN D3 ,Cranberry ,Omega-3 Fatty Acids .  HOLD metFORMIN (GLUCOPHAGE) beginning 11/04, may resume taking after surgery.  HOLD lisinopril  (ZESTRIL ) on the morning of surgery.  Continue taking all of your other prescription medications up until the day of surgery.  ON THE DAY OF SURGERY ONLY TAKE THESE MEDICATIONS WITH SIPS OF WATER:  metoprolol  succinate (TOPROL -XL)    No Alcohol for 24 hours before or  after surgery.  No Smoking including e-cigarettes for 24 hours before surgery.  No chewable tobacco products for at least 6 hours before surgery.  No nicotine patches on the day of surgery.  Do not use any recreational drugs for at least a week (preferably 2 weeks) before your surgery.  Please be advised that the combination of cocaine and anesthesia may have negative outcomes, up to and including death. If you test positive for cocaine, your surgery will be cancelled.  On the morning of surgery brush your teeth with toothpaste and water, you may rinse your mouth with mouthwash if you wish. Do not swallow any toothpaste or mouthwash.  Use CHG Soap or wipes as directed on instruction sheet.  Do not wear jewelry, make-up, hairpins, clips or nail polish.  For welded (permanent) jewelry: bracelets, anklets, waist bands, etc.  Please have this removed prior to surgery.  If it is not removed, there is a chance that hospital personnel will need to cut it off on the day of surgery.  Do not wear lotions, powders, or perfumes.   Do not shave body hair from the neck down 48 hours before surgery.  Contact lenses, hearing aids and dentures may not be worn into surgery.  Do not bring valuables to the hospital. [facility] is not responsible for any missing/lost belongings or valuables.   Total Shoulder Arthroplasty:  use Benzoyl Peroxide 5% Gel as directed on instruction sheet.  Notify your doctor if there is any change in your medical condition (cold, fever, infection).  Wear comfortable clothing (specific to your surgery type) to the  hospital.  After surgery, you can help prevent lung complications by doing breathing exercises.  Take deep breaths and cough every 1-2 hours. Your doctor may order a device called an Incentive Spirometer to help you take deep breaths.  If you are being admitted to the hospital overnight, leave your suitcase in the car. After surgery it may be brought to your  room.  In case of increased patient census, it may be necessary for you, the patient, to continue your postoperative care in the Same Day Surgery department.  If you are being discharged the day of surgery, you will not be allowed to drive home. You will need a responsible individual to drive you home and stay with you for 24 hours after surgery.   If you are taking public transportation, you will need to have a responsible individual with you.  Please call the Pre-admissions Testing Dept. at [phone] if you have any questions about these instructions.  Surgery Visitation Policy:  Patients having surgery or a procedure may have two visitors.  Children under the age of 51 must have an adult with them who is not the patient.  Inpatient Visitation:    Visiting hours are 7 a.m. to 8 p.m. Up to four visitors are allowed at one time in a patient room. The visitors may rotate out with other people during the day.  One visitor age 51 or older may stay with the patient overnight and must be in the room by 8 p.m.   Merchandiser, Retail to address health-related social needs:  [email or web address]    Pre-operative 4 CHG Bath Instructions   You can play a key role in reducing the risk of infection after surgery. Your skin needs to be as free of germs as possible. You can reduce the number of germs on your skin by washing with CHG (chlorhexidine  gluconate) soap before surgery. CHG is an antiseptic soap that kills germs and continues to kill germs even after washing.   DO NOT use if you have an allergy to chlorhexidine /CHG or antibacterial soaps. If your skin becomes reddened or irritated, stop using the CHG and notify one of our RNs at [phone].   Please shower with the CHG soap starting 4 days before surgery using the following schedule: 11/02 - 11/05.    Please keep in mind the following:  DO NOT shave, including legs and underarms, starting the day of your  first shower.   You may shave your face at any point before/day of surgery.  Place clean sheets on your bed the day you start using CHG soap. Use a clean washcloth (not used since being washed) for each shower. DO NOT sleep with pets once you start using the CHG.   CHG Shower Instructions:  If you choose to wash your hair and private area, wash first with your normal shampoo/soap.  After you use shampoo/soap, rinse your hair and body thoroughly to remove shampoo/soap residue.  Turn the water OFF and apply about 3 tablespoons (45 ml) of CHG soap to a CLEAN washcloth.  Apply CHG soap ONLY FROM YOUR NECK DOWN TO YOUR TOES (washing for 3-5 minutes)  DO NOT use CHG soap on face, private areas, open wounds, or sores.  Pay special attention to the area where your surgery is being performed.  If you are having back surgery, having someone wash your back for you may be helpful. Wait 2 minutes after CHG soap is applied, then you may rinse off the CHG soap.  Pat dry with a clean towel  Put on clean clothes/pajamas   If you choose to wear lotion, please use ONLY the CHG-compatible lotions on the back of this paper.     Additional instructions for the day of surgery: DO NOT APPLY any lotions, deodorants, cologne, or perfumes.   Put on clean/comfortable clothes.  Brush your teeth.  Ask your nurse before applying any prescription medications to the skin.      CHG Compatible Lotions   Aveeno Moisturizing lotion  Cetaphil Moisturizing Cream  Cetaphil Moisturizing Lotion  Clairol Herbal Essence Moisturizing Lotion, Dry Skin  Clairol Herbal Essence Moisturizing Lotion, Extra Dry Skin  Clairol Herbal Essence Moisturizing Lotion, Normal Skin  Curel Age Defying Therapeutic Moisturizing Lotion with Alpha Hydroxy  Curel Extreme Care Body Lotion  Curel Soothing Hands Moisturizing Hand Lotion  Curel Therapeutic Moisturizing Cream, Fragrance-Free  Curel Therapeutic Moisturizing Lotion, Fragrance-Free   Curel Therapeutic Moisturizing Lotion, Original Formula  Eucerin Daily Replenishing Lotion  Eucerin Dry Skin Therapy Plus Alpha Hydroxy Crme  Eucerin Dry Skin Therapy Plus Alpha Hydroxy Lotion  Eucerin Original Crme  Eucerin Original Lotion  Eucerin Plus Crme Eucerin Plus Lotion  Eucerin TriLipid Replenishing Lotion  Keri Anti-Bacterial Hand Lotion  Keri Deep Conditioning Original Lotion Dry Skin Formula Softly Scented  Keri Deep Conditioning Original Lotion, Fragrance Free Sensitive Skin Formula  Keri Lotion Fast Absorbing Fragrance Free Sensitive Skin Formula  Keri Lotion Fast Absorbing Softly Scented Dry Skin Formula  Keri Original Lotion  Keri Skin Renewal Lotion Keri Silky Smooth Lotion  Keri Silky Smooth Sensitive Skin Lotion  Nivea Body Creamy Conditioning Oil  Nivea Body Extra Enriched Lotion  Nivea Body Original Lotion  Nivea Body Sheer Moisturizing Lotion Nivea Crme  Nivea Skin Firming Lotion  NutraDerm 30 Skin Lotion  NutraDerm Skin Lotion  NutraDerm Therapeutic Skin Cream  NutraDerm Therapeutic Skin Lotion  ProShield Protective Hand Cream  Provon moisturizing lotion   Preparing for Total Shoulder Arthroplasty  Before surgery, you can play an important role by reducing the number of germs on your skin by using the following products:  Benzoyl Peroxide Gel  o Reduces the number of germs present on the skin  o Applied twice a day to shoulder area starting two days before surgery  Chlorhexidine  Gluconate (CHG) Soap  o An antiseptic cleaner that kills germs and bonds with the skin to continue killing germs even after washing  o Used for showering the night before surgery and morning of surgery  BENZOYL PEROXIDE 5% GEL                               Please do not use if you have an allergy to benzoyl peroxide. If your skin becomes reddened/irritated stop using the benzoyl peroxide.  Starting two days before surgery, apply as follows:  1. Apply benzoyl  peroxide in the morning and at night. Apply after taking a shower. If you are not taking a shower, clean entire shoulder front, back, and side along with the armpit with a clean wet washcloth.  2. Place a quarter-sized dollop on your shoulder and rub in thoroughly, making sure to cover the front, back, and side of your shoulder, along with the armpit.  2 days before ____ AM ____ PM 1 day before ____ AM ____ PM  3. Do this twice a day for two days. (Last application is the night before surgery, AFTER using the CHG soap).  4. Do NOT apply benzoyl peroxide gel on the day of surgery.   How to Use an Incentive Spirometer  An incentive spirometer is a tool that measures how well you are filling your lungs with each breath. Learning to take long, deep breaths using this tool can help you keep your lungs clear and active. This may help to reverse or lessen your chance of developing breathing (pulmonary) problems, especially infection. You may be asked to use a spirometer: After a surgery. If you have a lung problem or a history of smoking. After a long period of time when you have been unable to move or be active. If the spirometer includes an indicator to show the highest number that you have reached, your health care provider or respiratory therapist will help you set a goal. Keep a log of your progress as told by your health care provider. What are the risks? Breathing too quickly may cause dizziness or cause you to pass out. Take your time so you do not get dizzy or light-headed. If you are in pain, you may need to take pain medicine before doing incentive spirometry. It is harder to take a deep breath if you are having pain. How to use your incentive spirometer  Sit up on the edge of your bed or on a chair. Hold the incentive spirometer so that it is in an upright position. Before you use the spirometer, breathe out normally. Place the mouthpiece in your mouth. Make sure your lips are closed  tightly around it. Breathe in slowly and as deeply as you can through your mouth, causing the piston or the ball to rise toward the top of the chamber. Hold your breath for 3-5 seconds, or for as long as possible. If the spirometer includes a coach indicator, use this to guide you in breathing. Slow down your breathing if the indicator goes above the marked areas. Remove the mouthpiece from your mouth and breathe out normally. The piston or ball will return to the bottom of the chamber. Rest for a few seconds, then repeat the steps 10 or more times. Take your time and take a few normal breaths between deep breaths so that you do not get dizzy or light-headed. Do this every 1-2 hours when you are awake. If the spirometer includes a goal marker to show the highest number you have reached (best effort), use this as a goal to work toward during each repetition. After each set of 10 deep breaths, cough a few times. This will help to make sure that your lungs are clear. If you have an incision on your chest or abdomen from surgery, place a pillow or a rolled-up towel firmly against the incision when you cough. This can help to reduce pain while taking deep breaths and coughing. General tips When you are able to get out of bed: Walk around often. Continue to take deep breaths and cough in order to clear your lungs. Keep using the incentive spirometer until your health care provider says it is okay to stop using it. If you have been in the hospital, you may be told to keep using the spirometer at home. Contact a health care provider if: You are having difficulty using the spirometer. You have trouble using the spirometer as often as instructed. Your pain medicine is not giving enough relief for you to use the spirometer as told. You have a fever. Get help right away if: You develop shortness of breath. You develop a cough  with bloody mucus from the lungs. You have fluid or blood coming from an  incision site after you cough. Summary An incentive spirometer is a tool that can help you learn to take long, deep breaths to keep your lungs clear and active. You may be asked to use a spirometer after a surgery, if you have a lung problem or a history of smoking, or if you have been inactive for a long period of time. Use your incentive spirometer as instructed every 1-2 hours while you are awake. If you have an incision on your chest or abdomen, place a pillow or a rolled-up towel firmly against your incision when you cough. This will help to reduce pain. Get help right away if you have shortness of breath, you cough up bloody mucus, or blood comes from your incision when you cough. This information is not intended to replace advice given to you by your health care provider. Make sure you discuss any questions you have with your health care provider. Document Revised: 09/08/2019 Document Reviewed: 09/08/2019 Elsevier Patient Education  2023 Elsevier Inc.  POLAR CARE INFORMATION  Massadvertisement.it  How to use [facility] Therapy System?  YouTube   Shippingscam.co.uk  OPERATING INSTRUCTIONS  Start the product With dry hands, connect the transformer to the electrical connection located on the top of the cooler. Next, plug the transformer into an appropriate electrical outlet. The unit will automatically start running at this point.  To stop the pump, disconnect electrical power.  Unplug to stop the product when not in use. Unplugging the Polar Care unit turns it off. Always unplug immediately after use. Never leave it plugged in while unattended. Remove pad.    FIRST ADD WATER TO FILL LINE, THEN ICE---Replace ice when existing ice is almost melted  1 Discuss Treatment with your Licensed Health Care Practitioner and Use Only as Prescribed 2 Apply Insulation Barrier & Cold Therapy Pad 3 Check for Moisture 4 Inspect Skin Regularly  Tips and Trouble  Shooting Usage Tips 1. Use cubed or chunked ice for optimal performance. 2. It is recommended to drain the Pad between uses. To drain the pad, hold the Pad upright with the hose pointed toward the ground. Depress the black plunger and allow water to drain out. 3. You may disconnect the Pad from the unit without removing the pad from the affected area by depressing the silver tabs on the hose coupling and gently pulling the hoses apart. The Pad and unit will seal itself and will not leak. Note: Some dripping during release is normal. 4. DO NOT RUN PUMP WITHOUT WATER! The pump in this unit is designed to run with water. Running the unit without water will cause permanent damage to the pump. 5. Unplug unit before removing lid.  TROUBLESHOOTING GUIDE Pump not running, Water not flowing to the pad, Pad is not getting cold 1. Make sure the transformer is plugged into the wall outlet. 2. Confirm that the ice and water are filled to the indicated levels. 3. Make sure there are no kinks in the pad. 4. Gently pull on the blue tube to make sure the tube/pad junction is straight. 5. Remove the pad from the treatment site and ll it while the pad is lying at; then reapply. 6. Confirm that the pad couplings are securely attached to the unit. Listen for the double clicks (Figure 1) to confirm the pad couplings are securely attached.  Leaks    Note: Some condensation on the  lines, controller, and pads is unavoidable, especially in warmer climates. 1. If using a [facility] Polar Care Cold Therapy unit with a detachable Cold Therapy Pad, and a leak exists (other than condensation on the lines) disconnect the pad couplings. Make sure the silver tabs on the couplings are depressed before reconnecting the pad to the pump hose; then confirm both sides of the coupling are properly clicked in. 2. If the coupling continues to leak or a leak is detected in the pad itself, stop using it and call [employer] Customer Care at [phone]  [phone].  Cleaning After use, empty and dry the unit with a soft cloth. Warm water and mild detergent may be used occasionally to clean the pump and tubes.  WARNING: The Polar Care Cube can be cold enough to cause serious injury, including full skin necrosis. Follow these Operating Instructions, and carefully read the Product Insert (see pouch on side of unit) and the Cold Therapy Pad Fitting Instructions (provided with each Cold Therapy Pad) prior to use.                  Preoperative Educational Videos for Total Hip, Knee and Shoulder Replacements  To better prepare for surgery, please view our videos that explain the physical activity and discharge planning required to have the best surgical recovery at [facility].  [email or web address]  Questions? Call [phone] or email [email or web address]

## 2024-05-07 MED ORDER — SODIUM CHLORIDE 0.9 % IV SOLN: INTRAVENOUS | Status: DC

## 2024-05-07 MED ORDER — ORAL CARE MOUTH RINSE: 15.0000 mL | Freq: Once | OROMUCOSAL | Status: AC

## 2024-05-07 MED ORDER — CHLORHEXIDINE GLUCONATE 0.12 % MT SOLN
15.0000 mL | Freq: Once | OROMUCOSAL | Status: AC
  Administered 2024-05-08: 15 mL via OROMUCOSAL

## 2024-05-07 MED ORDER — CEFAZOLIN SODIUM-DEXTROSE 2-4 GM/100ML-% IV SOLN
2.0000 g | INTRAVENOUS | Status: AC
  Administered 2024-05-08: 2 g via INTRAVENOUS

## 2024-05-07 MED ORDER — TRANEXAMIC ACID-NACL 1000-0.7 MG/100ML-% IV SOLN
1000.0000 mg | INTRAVENOUS | Status: AC
  Administered 2024-05-08: 1000 mg via INTRAVENOUS

## 2024-05-08 ENCOUNTER — Encounter
Admission: RE | Disposition: A | Discharge status: Home / Self Care | Payer: Self-pay | Source: Home / Self Care | Attending: Surgery

## 2024-05-08 ENCOUNTER — Ambulatory Visit

## 2024-05-08 ENCOUNTER — Ambulatory Visit: Admitting: Anesthesiology

## 2024-05-08 ENCOUNTER — Other Ambulatory Visit: Payer: Self-pay

## 2024-05-08 ENCOUNTER — Ambulatory Visit
Admission: RE | Admit: 2024-05-08 | Discharge: 2024-05-08 | Disposition: A | Discharge status: Home / Self Care | Attending: Surgery | Admitting: Surgery

## 2024-05-08 ENCOUNTER — Ambulatory Visit: Payer: Self-pay | Admitting: Urgent Care

## 2024-05-08 ENCOUNTER — Encounter: Payer: Self-pay | Admitting: Surgery

## 2024-05-08 DIAGNOSIS — N183 Chronic kidney disease, stage 3 unspecified: Secondary | ICD-10-CM | POA: Diagnosis not present

## 2024-05-08 DIAGNOSIS — Z7984 Long term (current) use of oral hypoglycemic drugs: Secondary | ICD-10-CM | POA: Insufficient documentation

## 2024-05-08 DIAGNOSIS — M75121 Complete rotator cuff tear or rupture of right shoulder, not specified as traumatic: Secondary | ICD-10-CM | POA: Insufficient documentation

## 2024-05-08 DIAGNOSIS — Z87891 Personal history of nicotine dependence: Secondary | ICD-10-CM | POA: Diagnosis not present

## 2024-05-08 DIAGNOSIS — M7581 Other shoulder lesions, right shoulder: Secondary | ICD-10-CM | POA: Insufficient documentation

## 2024-05-08 DIAGNOSIS — I129 Hypertensive chronic kidney disease with stage 1 through stage 4 chronic kidney disease, or unspecified chronic kidney disease: Secondary | ICD-10-CM | POA: Diagnosis not present

## 2024-05-08 DIAGNOSIS — E119 Type 2 diabetes mellitus without complications: Secondary | ICD-10-CM

## 2024-05-08 DIAGNOSIS — E1122 Type 2 diabetes mellitus with diabetic chronic kidney disease: Secondary | ICD-10-CM | POA: Diagnosis not present

## 2024-05-08 HISTORY — PX: REVERSE SHOULDER ARTHROPLASTY: SHX5054

## 2024-05-08 LAB — GLUCOSE, CAPILLARY
Glucose-Capillary: 92 mg/dL (ref 70–99)
Glucose-Capillary: 133 mg/dL — ABNORMAL HIGH (ref 70–99)

## 2024-05-08 SURGERY — ARTHROPLASTY, SHOULDER, TOTAL, REVERSE
Anesthesia: Regional | Site: Shoulder | Laterality: Right

## 2024-05-08 MED ORDER — ONDANSETRON HCL 4 MG PO TABS
4.0000 mg | ORAL_TABLET | Freq: Four times a day (QID) | ORAL | Status: DC | PRN
Start: 2024-05-08 — End: 2024-05-08

## 2024-05-08 MED ORDER — ACETAMINOPHEN 10 MG/ML IV SOLN
INTRAVENOUS | Status: AC
  Filled 2024-05-08: qty 100

## 2024-05-08 MED ORDER — FENTANYL CITRATE (PF) 100 MCG/2ML IJ SOLN
INTRAMUSCULAR | Status: DC | PRN
  Administered 2024-05-08 (×2): 50 ug via INTRAVENOUS

## 2024-05-08 MED ORDER — DEXMEDETOMIDINE HCL IN NACL 80 MCG/20ML IV SOLN
INTRAVENOUS | Status: DC | PRN
  Administered 2024-05-08 (×2): 8 ug via INTRAVENOUS

## 2024-05-08 MED ORDER — CEFAZOLIN SODIUM-DEXTROSE 2-4 GM/100ML-% IV SOLN
INTRAVENOUS | Status: AC
  Filled 2024-05-08: qty 100

## 2024-05-08 MED ORDER — FENTANYL CITRATE (PF) 50 MCG/ML IJ SOSY
50.0000 ug | PREFILLED_SYRINGE | Freq: Once | INTRAMUSCULAR | Status: AC
  Administered 2024-05-08: 50 ug via INTRAVENOUS

## 2024-05-08 MED ORDER — BUPIVACAINE LIPOSOME 1.3 % IJ SUSP
INTRAMUSCULAR | Status: AC
  Filled 2024-05-08: qty 20

## 2024-05-08 MED ORDER — OXYCODONE HCL 5 MG PO TABS: 5.0000 mg | ORAL_TABLET | Freq: Once | ORAL | Status: DC | PRN

## 2024-05-08 MED ORDER — FENTANYL CITRATE (PF) 100 MCG/2ML IJ SOLN: 25.0000 ug | INTRAMUSCULAR | Status: DC | PRN

## 2024-05-08 MED ORDER — PHENYLEPHRINE HCL-NACL 20-0.9 MG/250ML-% IV SOLN
INTRAVENOUS | Status: AC
  Filled 2024-05-08: qty 250

## 2024-05-08 MED ORDER — ONDANSETRON HCL 4 MG/2ML IJ SOLN
INTRAMUSCULAR | Status: DC | PRN
  Administered 2024-05-08: 4 mg via INTRAVENOUS

## 2024-05-08 MED ORDER — PHENYLEPHRINE 80 MCG/ML (10ML) SYRINGE FOR IV PUSH (FOR BLOOD PRESSURE SUPPORT)
PREFILLED_SYRINGE | INTRAVENOUS | Status: AC
  Filled 2024-05-08: qty 10

## 2024-05-08 MED ORDER — OXYCODONE HCL 5 MG PO TABS: 5.0000 mg | ORAL_TABLET | ORAL | 0 refills | Status: AC | PRN

## 2024-05-08 MED ORDER — BUPIVACAINE-EPINEPHRINE (PF) 0.5% -1:200000 IJ SOLN
INTRAMUSCULAR | Status: DC | PRN
  Administered 2024-05-08: 30 mL

## 2024-05-08 MED ORDER — BUPIVACAINE-EPINEPHRINE (PF) 0.5% -1:200000 IJ SOLN
INTRAMUSCULAR | Status: AC
  Filled 2024-05-08: qty 30

## 2024-05-08 MED ORDER — ROCURONIUM BROMIDE 100 MG/10ML IV SOLN
INTRAVENOUS | Status: DC | PRN
  Administered 2024-05-08: 50 mg via INTRAVENOUS

## 2024-05-08 MED ORDER — CEFAZOLIN SODIUM-DEXTROSE 2-4 GM/100ML-% IV SOLN
INTRAVENOUS | Status: AC
Start: 2024-05-08 — End: 2024-05-08
  Filled 2024-05-08: qty 100

## 2024-05-08 MED ORDER — CEFAZOLIN SODIUM-DEXTROSE 2-4 GM/100ML-% IV SOLN
2.0000 g | Freq: Four times a day (QID) | INTRAVENOUS | Status: DC
  Administered 2024-05-08: 2 g via INTRAVENOUS

## 2024-05-08 MED ORDER — ONDANSETRON HCL 4 MG/2ML IJ SOLN
4.0000 mg | Freq: Once | INTRAMUSCULAR | Status: DC | PRN
Start: 2024-05-08 — End: 2024-05-08

## 2024-05-08 MED ORDER — PROPOFOL 1000 MG/100ML IV EMUL
INTRAVENOUS | Status: AC
  Filled 2024-05-08: qty 100

## 2024-05-08 MED ORDER — BUPIVACAINE LIPOSOME 1.3 % IJ SUSP
INTRAMUSCULAR | Status: DC | PRN
  Administered 2024-05-08: 20 mL

## 2024-05-08 MED ORDER — ONDANSETRON HCL 4 MG/2ML IJ SOLN
INTRAMUSCULAR | Status: AC
  Filled 2024-05-08: qty 2

## 2024-05-08 MED ORDER — DEXMEDETOMIDINE HCL IN NACL 80 MCG/20ML IV SOLN
INTRAVENOUS | Status: AC
  Filled 2024-05-08: qty 20

## 2024-05-08 MED ORDER — LACTATED RINGERS IV SOLN: INTRAVENOUS | Status: DC

## 2024-05-08 MED ORDER — PROMETHAZINE HCL 25 MG PO TABS: 25.0000 mg | ORAL_TABLET | Freq: Four times a day (QID) | ORAL | 0 refills | Status: AC | PRN

## 2024-05-08 MED ORDER — PROPOFOL 10 MG/ML IV BOLUS
INTRAVENOUS | Status: AC
  Filled 2024-05-08: qty 20

## 2024-05-08 MED ORDER — SODIUM CHLORIDE 0.9 % IR SOLN
Status: DC | PRN
  Administered 2024-05-08: 3000 mL

## 2024-05-08 MED ORDER — OXYCODONE HCL 5 MG/5ML PO SOLN: 5.0000 mg | Freq: Once | ORAL | Status: DC | PRN

## 2024-05-08 MED ORDER — FENTANYL CITRATE (PF) 50 MCG/ML IJ SOSY
PREFILLED_SYRINGE | INTRAMUSCULAR | Status: AC
  Filled 2024-05-08: qty 1

## 2024-05-08 MED ORDER — LIDOCAINE HCL (CARDIAC) PF 100 MG/5ML IV SOSY
PREFILLED_SYRINGE | INTRAVENOUS | Status: DC | PRN
Start: 2024-05-08 — End: 2024-05-08
  Administered 2024-05-08: 100 mg via INTRAVENOUS

## 2024-05-08 MED ORDER — METOCLOPRAMIDE HCL 10 MG PO TABS: 5.0000 mg | ORAL_TABLET | Freq: Three times a day (TID) | ORAL | Status: DC | PRN

## 2024-05-08 MED ORDER — PHENYLEPHRINE 80 MCG/ML (10ML) SYRINGE FOR IV PUSH (FOR BLOOD PRESSURE SUPPORT)
PREFILLED_SYRINGE | INTRAVENOUS | Status: DC | PRN
  Administered 2024-05-08: 40 ug via INTRAVENOUS
  Administered 2024-05-08 (×3): 80 ug via INTRAVENOUS

## 2024-05-08 MED ORDER — TRANEXAMIC ACID-NACL 1000-0.7 MG/100ML-% IV SOLN
INTRAVENOUS | Status: AC
  Filled 2024-05-08: qty 100

## 2024-05-08 MED ORDER — CHLORHEXIDINE GLUCONATE 0.12 % MT SOLN
OROMUCOSAL | Status: AC
Start: 2024-05-08 — End: 2024-05-08
  Filled 2024-05-08: qty 15

## 2024-05-08 MED ORDER — SUGAMMADEX SODIUM 200 MG/2ML IV SOLN
INTRAVENOUS | Status: DC | PRN
  Administered 2024-05-08: 120 mg via INTRAVENOUS

## 2024-05-08 MED ORDER — LIDOCAINE HCL (PF) 2 % IJ SOLN
INTRAMUSCULAR | Status: AC
  Filled 2024-05-08: qty 5

## 2024-05-08 MED ORDER — ACETAMINOPHEN 325 MG PO TABS: 325.0000 mg | ORAL_TABLET | Freq: Four times a day (QID) | ORAL | Status: DC | PRN

## 2024-05-08 MED ORDER — ACETAMINOPHEN 10 MG/ML IV SOLN
INTRAVENOUS | Status: DC | PRN
  Administered 2024-05-08: 1000 mg via INTRAVENOUS

## 2024-05-08 MED ORDER — ONDANSETRON HCL 4 MG/2ML IJ SOLN: 4.0000 mg | Freq: Four times a day (QID) | INTRAMUSCULAR | Status: DC | PRN

## 2024-05-08 MED ORDER — BUPIVACAINE HCL (PF) 0.5 % IJ SOLN
INTRAMUSCULAR | Status: AC
  Filled 2024-05-08: qty 10

## 2024-05-08 MED ORDER — TRANEXAMIC ACID-NACL 1000-0.7 MG/100ML-% IV SOLN
INTRAVENOUS | Status: AC
Start: 2024-05-08 — End: 2024-05-08
  Filled 2024-05-08: qty 100

## 2024-05-08 MED ORDER — METOCLOPRAMIDE HCL 5 MG/ML IJ SOLN: 5.0000 mg | Freq: Three times a day (TID) | INTRAMUSCULAR | Status: DC | PRN

## 2024-05-08 MED ORDER — OXYCODONE HCL 5 MG PO TABS: 5.0000 mg | ORAL_TABLET | ORAL | Status: DC | PRN

## 2024-05-08 MED ORDER — BUPIVACAINE HCL (PF) 0.5 % IJ SOLN
INTRAMUSCULAR | Status: DC | PRN
  Administered 2024-05-08: 10 mL

## 2024-05-08 MED ORDER — PROPOFOL 10 MG/ML IV BOLUS
INTRAVENOUS | Status: DC | PRN
Start: 2024-05-08 — End: 2024-05-08
  Administered 2024-05-08: 80 mg via INTRAVENOUS
  Administered 2024-05-08: 100 ug/kg/min via INTRAVENOUS

## 2024-05-08 MED ORDER — SODIUM CHLORIDE 0.9 % IV SOLN: INTRAVENOUS | Status: DC

## 2024-05-08 MED ORDER — DEXAMETHASONE SOD PHOSPHATE PF 10 MG/ML IJ SOLN
INTRAMUSCULAR | Status: DC | PRN
Start: 2024-05-08 — End: 2024-05-08
  Administered 2024-05-08: 10 mg via INTRAVENOUS

## 2024-05-08 MED ORDER — FENTANYL CITRATE (PF) 100 MCG/2ML IJ SOLN
INTRAMUSCULAR | Status: AC
  Filled 2024-05-08: qty 2

## 2024-05-08 MED ORDER — ACETAMINOPHEN 10 MG/ML IV SOLN: 1000.0000 mg | Freq: Once | INTRAVENOUS | Status: DC | PRN

## 2024-05-08 MED ORDER — ROCURONIUM BROMIDE 10 MG/ML (PF) SYRINGE
PREFILLED_SYRINGE | INTRAVENOUS | Status: AC
  Filled 2024-05-08: qty 10

## 2024-05-08 SURGICAL SUPPLY — 60 items
BASEPLATE BOSS DRILL (MISCELLANEOUS) IMPLANT
BIT DRILL 2.5X4.5XSCR (BIT) IMPLANT
BIT DRILL F/BASEPLATE CENTRAL (BIT) IMPLANT
BLADE SAW SAG 25X90X1.19 (BLADE) ×1 IMPLANT
CHLORAPREP W/TINT 26 (MISCELLANEOUS) ×1 IMPLANT
COOLER ICEMAN CLASSIC (MISCELLANEOUS) ×1 IMPLANT
DRAPE INCISE IOBAN 66X45 STRL (DRAPES) ×1 IMPLANT
DRAPE SHEET LG 3/4 BI-LAMINATE (DRAPES) ×1 IMPLANT
DRAPE TABLE BACK 80X90 (DRAPES) ×1 IMPLANT
DRSG OPSITE POSTOP 4X8 (GAUZE/BANDAGES/DRESSINGS) ×1 IMPLANT
ELECT CAUTERY BLADE 6.4 (BLADE) ×1 IMPLANT
ELECTRODE BLDE 4.0 EZ CLN MEGD (MISCELLANEOUS) ×1 IMPLANT
ELECTRODE REM PT RTRN 9FT ADLT (ELECTROSURGICAL) ×1 IMPLANT
GAUZE XEROFORM 1X8 LF (GAUZE/BANDAGES/DRESSINGS) ×1 IMPLANT
GLENOSPHERE RSS CONCENTRIC-S 5 (Shoulder) IMPLANT
GLOVE BIO SURGEON STRL SZ7.5 (GLOVE) ×4 IMPLANT
GLOVE BIO SURGEON STRL SZ8 (GLOVE) ×4 IMPLANT
GLOVE BIOGEL PI IND STRL 8 (GLOVE) ×2 IMPLANT
GLOVE INDICATOR 8.0 STRL GRN (GLOVE) ×1 IMPLANT
GOWN STRL REUS W/ TWL LRG LVL3 (GOWN DISPOSABLE) ×2 IMPLANT
GOWN STRL REUS W/ TWL XL LVL3 (GOWN DISPOSABLE) ×1 IMPLANT
GUIDE PIN 2.0 X 150 (WIRE) IMPLANT
HANDLE YANKAUER SUCT OPEN TIP (MISCELLANEOUS) ×1 IMPLANT
HOOD PEEL AWAY T7 (MISCELLANEOUS) ×3 IMPLANT
KIT STABILIZATION SHOULDER (MISCELLANEOUS) ×1 IMPLANT
KIT TURNOVER KIT A (KITS) ×1 IMPLANT
LINER STD +0S RSS HXL (Liner) IMPLANT
MANIFOLD NEPTUNE II (INSTRUMENTS) ×1 IMPLANT
MASK FACE SPIDER DISP (MASK) ×1 IMPLANT
MAT ABSORB FLUID 56X50 GRAY (MISCELLANEOUS) ×1 IMPLANT
NDL MAYO CATGUT SZ1 (NEEDLE) IMPLANT
NDL SAFETY ECLIP 18X1.5 (MISCELLANEOUS) ×1 IMPLANT
NDL SPNL 20GX3.5 QUINCKE YW (NEEDLE) ×1 IMPLANT
NEEDLE MAYO CATGUT SZ1 (NEEDLE) IMPLANT
NEEDLE SPNL 20GX3.5 QUINCKE YW (NEEDLE) ×1 IMPLANT
PACK ARTHROSCOPY SHOULDER (MISCELLANEOUS) ×1 IMPLANT
PAD ARMBOARD POSITIONER FOAM (MISCELLANEOUS) ×1 IMPLANT
PAD COLD SHLDR SM WRAP-ON (PAD) ×1 IMPLANT
PENCIL SMOKE EVACUATOR (MISCELLANEOUS) ×1 IMPLANT
PLATE BASE REVERSE RSS S (Plate) IMPLANT
SCREW 4.5X15 RSS W CAP (Screw) IMPLANT
SCREW 4.5X20 RSS W CAP (Screw) IMPLANT
SCREW 4.5X30 RSS W CAP (Screw) IMPLANT
SCREW BODY REVERSE STD (Screw) IMPLANT
SLING ULTRA II LG (MISCELLANEOUS) IMPLANT
SLING ULTRA II M (MISCELLANEOUS) IMPLANT
SOL .9 NS 3000ML IRR UROMATIC (IV SOLUTION) ×1 IMPLANT
SPONGE T-LAP 18X18 ~~LOC~~+RFID (SPONGE) ×2 IMPLANT
STAPLER SKIN PROX 35W (STAPLE) ×1 IMPLANT
STEM PRESS FIT SZ 10 TSS (Stem) IMPLANT
SUT ETHIBOND 0 MO6 C/R (SUTURE) ×1 IMPLANT
SUT VIC AB 0 CT1 36 (SUTURE) ×1 IMPLANT
SUT VIC AB 2-0 CT1 TAPERPNT 27 (SUTURE) ×2 IMPLANT
SUTURE FIBERWR #2 38 BLUE 1/2 (SUTURE) ×3 IMPLANT
SYR 10ML LL (SYRINGE) ×1 IMPLANT
SYR 30ML LL (SYRINGE) ×1 IMPLANT
SYR TOOMEY 50ML (SYRINGE) ×1 IMPLANT
TIP FAN IRRIG PULSAVAC PLUS (DISPOSABLE) ×1 IMPLANT
TRAP FLUID SMOKE EVACUATOR (MISCELLANEOUS) ×1 IMPLANT
WATER STERILE IRR 500ML POUR (IV SOLUTION) ×1 IMPLANT

## 2024-05-08 NOTE — Anesthesia Procedure Notes (Signed)
 Procedure Name: Intubation Date/Time: 05/08/2024 10:51 AM  Performed by: Jackye Spanner, CRNAPre-anesthesia Checklist: Patient identified, Patient being monitored, Timeout performed, Emergency Drugs available and Suction available Patient Re-evaluated:Patient Re-evaluated prior to induction Oxygen Delivery Method: Circle system utilized Preoxygenation: Pre-oxygenation with 100% oxygen Induction Type: IV induction Ventilation: Mask ventilation without difficulty Laryngoscope Size: 3 and McGrath Grade View: Grade I Tube type: Oral Tube size: 6.5 mm Number of attempts: 1 Airway Equipment and Method: Stylet Placement Confirmation: ETT inserted through vocal cords under direct vision, positive ETCO2 and breath sounds checked- equal and bilateral Secured at: 20 cm Tube secured with: Tape Dental Injury: Teeth and Oropharynx as per pre-operative assessment  Comments: Smooth atraumatic intubation, no complications noted.

## 2024-05-08 NOTE — Anesthesia Procedure Notes (Signed)
 Anesthesia Regional Block: Interscalene brachial plexus block   Pre-Anesthetic Checklist: , timeout performed,  Correct Patient, Correct Site, Correct Laterality,  Correct Procedure,, risks and benefits discussed,  Surgical consent,  Pre-op evaluation,  At surgeon's request and post-op pain management  Laterality: Right  Prep: chloraprep       Needles:  Injection technique: Single-shot  Needle Type: Echogenic Needle          Additional Needles:   Procedures:,,,, ultrasound used (permanent image in chart),,   Motor weakness within 20 minutes.  Narrative:  Start time: 05/08/2024 9:43 AM End time: 05/08/2024 9:45 AM Injection made incrementally with aspirations every 5 mL.  Performed by: Personally  Anesthesiologist: Shellie Odor, MD  Additional Notes: Functioning IV was confirmed and monitors applied.  Sterile prep and drape, hand hygiene and sterile gloves were used. Ultrasound guidance: relevant anatomy identified, needle position confirmed, local anesthetic spread visualized around nerve(s), vascular puncture avoided.  Image saved to electronic medical record.  Negative aspiration prior to incremental administration of local anesthetic for total 20 ml Exparel  and 10 ml bupivacaine  0.5% given in interscalene distribution. The patient tolerated the procedure well. Vital signs and moderate sedation medications recorded in RN notes.

## 2024-05-08 NOTE — Discharge Instructions (Addendum)
 Orthopedic discharge instructions: May shower with intact OpSite dressing once nerve block has worn off (Monday).  Apply ice frequently to shoulder or use Polar Care device. Take oxycodone  as prescribed when needed.  May supplement with ES Tylenol  if necessary. Keep shoulder immobilizer on at all times except may remove for bathing purposes. Follow-up in 10-14 days or as scheduled.     Interscalene Nerve Block with Exparel    For your surgery you have received an Interscalene Nerve Block with Exparel . Nerve Blocks affect many types of nerves, including nerves that control movement, pain and normal sensation.  You may experience feelings such as numbness, tingling, heaviness, weakness or the inability to move your arm or the feeling or sensation that your arm has fallen asleep. A nerve block with Exparel  can last up to 5 days.  Usually the weakness wears off first.  The tingling and heaviness usually wear off next.  Finally you may start to notice pain.  Keep in mind that this may occur in any order.  Once a nerve block starts to wear off it is usually completely gone within 60 minutes. ISNB may cause mild shortness of breath, a hoarse voice, blurry vision, unequal pupils, or drooping of the face on the same side as the nerve block.  These symptoms will usually resolve with the numbness.  Very rarely the procedure itself can cause mild seizures. If needed, your surgeon will give you a prescription for pain medication.  It will take about 60 minutes for the oral pain medication to become fully effective.  So, it is recommended that you start taking this medication before the nerve block first begins to wear off, or when you first begin to feel discomfort. Take your pain medication only as prescribed.  Pain medication can cause sedation and decrease your breathing if you take more than you need for the level of pain that you have. Nausea is a common side effect of many pain medications.  You may want  to eat something before taking your pain medicine to prevent nausea. After an Interscalene nerve block, you cannot feel pain, pressure or extremes in temperature in the effected arm.  Because your arm is numb it is at an increased risk for injury.  To decrease the possibility of injury, please practice the following:  While you are awake change the position of your arm frequently to prevent too much pressure on any one area for prolonged periods of time.  If you have a cast or tight dressing, check the color or your fingers every couple of hours.  Call your surgeon with the appearance of any discoloration (white or blue). If you are given a sling to wear before you go home, please wear it  at all times until the block has completely worn off.  Do not get up at night without your sling. Please contact [facility] or your surgeon if you do not begin to regain sensation after 7 days from the surgery.  Anesthesia may be contacted by calling the Same Day Surgery Department, Mon. through Fri., 6 am to 4 pm at [phone].   If you experience any other problems or concerns, please contact your surgeon's office. If you experience severe or prolonged shortness of breath go to the nearest emergency department.  SHOULDER SLING IMMOBILIZER   VIDEO Slingshot 2 Shoulder Brace Application - YouTube ---[email or web address]  INSTRUCTIONS While supporting the injured arm, slide the forearm into the sling. Wrap the adjustable shoulder strap around the neck and shoulders  and attach the strap end to the sling using  the "alligator strap tab."  Adjust the shoulder strap to the required length. Position the shoulder pad behind the neck. To secure the shoulder pad location (optional), pull the shoulder strap away from the shoulder pad, unfold the hook material on the top of the pad, then press the shoulder strap back onto the hook material to secure the pad in place. Attach the closure strap  across the open top of the sling. Position the strap so that it holds the arm securely in the sling. Next, attach the thumb strap to the open end of the sling between the thumb and fingers. After sling has been fit, it may be easily removed and reapplied using the quick release buckle on shoulder strap. If a neutral pillow or 15 abduction pillow is included, place the pillow at the waistline. Attach the sling to the pillow, lining up hook material on the pillow with the loop on sling. Adjust the waist strap to fit.  If waist strap is too long, cut it to fit. Use the small piece of double sided hook material (located on top of the pillow) to secure the strap end. Place the double sided hook material on the inside of the cut strap end and secure it to the waist strap.     If no pillow is included, attach the waist strap to the sling and adjust to fit.    Washing Instructions: Straps and sling must be removed and cleaned regularly depending on your activity level and perspiration. Hand wash straps and sling in cold water with mild detergent, rinse, air dry

## 2024-05-08 NOTE — Anesthesia Postprocedure Evaluation (Signed)
 Anesthesia Post Note  Patient: Patricia Lucas  Procedure(s) Performed: ARTHROPLASTY, SHOULDER, TOTAL, REVERSE (Right: Shoulder)  Patient location during evaluation: PACU Anesthesia Type: Regional Level of consciousness: awake and alert, oriented and patient cooperative Pain management: pain level controlled Vital Signs Assessment: post-procedure vital signs reviewed and stable Respiratory status: spontaneous breathing, nonlabored ventilation and respiratory function stable Cardiovascular status: blood pressure returned to baseline and stable Postop Assessment: adequate PO intake Anesthetic complications: no   No notable events documented.   Last Vitals:  Vitals:   05/08/24 1345 05/08/24 1400  BP: (!) 150/72 (!) 162/72  Pulse: 79 76  Resp: (!) 25 17  Temp:    SpO2: 98% 93%    Last Pain:  Vitals:   05/08/24 1400  TempSrc:   PainSc: 0-No pain                 Alfonso Ruths

## 2024-05-08 NOTE — Transfer of Care (Addendum)
 Immediate Anesthesia Transfer of Care Note  Patient: Patricia Lucas  Procedure(s) Performed: ARTHROPLASTY, SHOULDER, TOTAL, REVERSE (Right: Shoulder)  Patient Location: PACU  Anesthesia Type:General  Level of Consciousness: awake, alert , and oriented  Airway & Oxygen Therapy: Patient Spontanous Breathing  Post-op Assessment: Report given to RN and Post -op Vital signs reviewed and stable  Post vital signs: Reviewed and stable  Last Vitals:  Vitals Value Taken Time  BP 122/96 05/08/24 13:22  Temp 35.9 1322  Pulse 74 05/08/24 13:26  Resp 17 05/08/24 13:25  SpO2 96 % 05/08/24 13:26  Vitals shown include unfiled device data.  Last Pain:  Vitals:   05/08/24 0943  TempSrc:   PainSc: 0-No pain         Complications: No notable events documented.

## 2024-05-08 NOTE — Anesthesia Preprocedure Evaluation (Addendum)
 Anesthesia Evaluation  Patient identified by MRN, date of birth, ID band Patient awake    Reviewed: Allergy & Precautions, NPO status , Patient's Chart, lab work & pertinent test results  History of Anesthesia Complications (+) PONV and history of anesthetic complications  Airway Mallampati: II   Neck ROM: Full    Dental  (+) Implants, Loose   Pulmonary former smoker (quit 1999)   Pulmonary exam normal breath sounds clear to auscultation       Cardiovascular hypertension, Normal cardiovascular exam Rhythm:Regular Rate:Normal  ECG 05/01/24: normal   Neuro/Psych negative neurological ROS     GI/Hepatic ,GERD  ,,  Endo/Other  diabetes, Type 2    Renal/GU Renal disease (stage III CKD)     Musculoskeletal   Abdominal   Peds  Hematology  (+) Blood dyscrasia, anemia   Anesthesia Other Findings   Reproductive/Obstetrics                              Anesthesia Physical Anesthesia Plan  ASA: 2  Anesthesia Plan: General and Regional   Post-op Pain Management: Regional block*   Induction: Intravenous  PONV Risk Score and Plan: 4 or greater and Ondansetron , Dexamethasone and Treatment may vary due to age or medical condition  Airway Management Planned: Oral ETT  Additional Equipment:   Intra-op Plan:   Post-operative Plan: Extubation in OR  Informed Consent: I have reviewed the patients History and Physical, chart, labs and discussed the procedure including the risks, benefits and alternatives for the proposed anesthesia with the patient or authorized representative who has indicated his/her understanding and acceptance.     Dental advisory given  Plan Discussed with: CRNA  Anesthesia Plan Comments: (Plan for preoperative interscalene nerve block and GETA. Patient consented for risks of anesthesia including but not limited to:  - adverse reactions to medications - damage to eyes,  teeth, lips or other oral mucosa - nerve damage due to positioning  - sore throat or hoarseness - damage to heart, brain, nerves, lungs, other parts of body or loss of life  Informed patient about role of CRNA in peri- and intra-operative care.  Patient voiced understanding.)         Anesthesia Quick Evaluation

## 2024-05-08 NOTE — Evaluation (Signed)
 Occupational Therapy Evaluation Patient Details Name: Patricia Lucas MRN: [id number] DOB: 30-Sep-1940 Today's Date: 05/08/2024   History of Present Illness   83 year old female s/p reverse right total shoulder arthroplasty with biceps tenodesis. PMH of HTN, GERD, CKD3, DM2, anemia     Clinical Impressions Pt was seen for an OT evaluation this date. Pt lives in a one level home with 3 STE by herself. Prior to surgery, pt was active and independent. Pt has orders for RUE to be immobilized and will be NWBing per MD. Pt presents with impaired strength/ROM, pain, and sensation to RUE with block not completely resolved yet. These impairments result in a decreased ability to perform self care tasks requiring max/mod assist for UB/LB dressing and max assist for application of polar care, compression stockings, and sling/immobilizer. Pt demo transfers and mobility via HHA x1/CGA to ambulate and perform all aspects of toileting and stair training. Pt and daughter instructed in polar care mgt, compression stockings mgt, sling/immobilizer mgt, ROM exercises for RUE (with instructions for no shoulder exercises until full sensation has returned), RUE precautions, adaptive strategies for ADL management, positioning and considerations for sleep, and home/routines modifications to maximize falls prevention, safety, and independence. Handout provided. OT adjusted sling/immobilizer and polar care to improve comfort, optimize positioning, and to maximize skin integrity/safety. Pt verbalized understanding of all education/training provided. Recommend HHOT services to continue therapy to maximize return to PLOF, address home/routines modifications and safety, minimize falls risk, and minimize caregiver burden.        If plan is discharge home, recommend the following:   A lot of help with bathing/dressing/bathroom;A little help with walking and/or transfers     Functional Status Assessment   Patient has not had  a recent decline in their functional status     Equipment Recommendations         Recommendations for Other Services         Precautions/Restrictions         Mobility Bed Mobility               General bed mobility comments: NT in recliner pre/post session    Transfers Overall transfer level: Needs assistance Equipment used: 1 person hand held assist Transfers: Sit to/from Stand Sit to Stand: Supervision           General transfer comment: ambulated with CGA/HHA x1 to ambulate to the bathroom and perform stair training      Balance Overall balance assessment: Needs assistance   Sitting balance-Leahy Scale: Good     Standing balance support: Single extremity supported, During functional activity Standing balance-Leahy Scale: Fair                             ADL either performed or assessed with clinical judgement   ADL Overall ADL's : Needs assistance/impaired     Grooming: Wash/dry hands;Minimal assistance;Standing           Upper Body Dressing : Moderate assistance;Maximal assistance;Sitting Upper Body Dressing Details (indicate cue type and reason): donn shirt Lower Body Dressing: Maximal assistance;Sit to/from stand   Toilet Transfer: Electrical Engineer and Hygiene: Contact guard assist;Sitting/lateral lean;Sit to/from stand;Minimal assistance       Functional mobility during ADLs: Contact guard assist       Vision         Perception         Praxis  Pertinent Vitals/Pain Pain Assessment Pain Assessment: No/denies pain     Extremity/Trunk Assessment Upper Extremity Assessment Upper Extremity Assessment: Right hand dominant;Generalized weakness;RUE deficits/detail RUE Deficits / Details: s/p R RSA in immobilizer   Lower Extremity Assessment Lower Extremity Assessment: Generalized weakness       Communication Communication Communication: No  apparent difficulties   Cognition Arousal: Alert Behavior During Therapy: WFL for tasks assessed/performed Cognition: No apparent impairments                                       Cueing  General Comments          Exercises Other Exercises Other Exercises: Edu on role of OT in acute setting, polar care management, compression stocking management, and shoulder immobilizer management, shoulder precautions, ADL management, etc.   Shoulder Instructions      Home Living Family/patient expects to be discharged to:: Private residence Living Arrangements: Alone Available Help at Discharge: Family;Available 24 hours/day Type of Home: House Home Access: Stairs to enter [employer] of Steps: 3 Entrance Stairs-Rails: Right Home Layout: One level                          Prior Functioning/Environment Prior Level of Function : Independent/Modified Independent                    OT Problem List: Decreased strength;Decreased range of motion;Pain   OT Treatment/Interventions:        OT Goals(Current goals can be found in the care plan section)       OT Frequency:       Co-evaluation              AM-PAC OT 6 Clicks Daily Activity     Outcome Measure Help from another person eating meals?: A Little Help from another person taking care of personal grooming?: A Little Help from another person toileting, which includes using toliet, bedpan, or urinal?: A Little Help from another person bathing (including washing, rinsing, drying)?: A Lot Help from another person to put on and taking off regular upper body clothing?: A Lot Help from another person to put on and taking off regular lower body clothing?: A Lot 6 Click Score: 15   End of Session Equipment Utilized During Treatment:  (shoulder immobilizer)  Activity Tolerance: Patient tolerated treatment well Patient left: in chair;with nursing/sitter in room;with family/visitor  present  OT Visit Diagnosis: Other abnormalities of gait and mobility (R26.89)                Time: 8491-8449 OT Time Calculation (min): 42 min Charges:  OT General Charges $OT Visit: 1 Visit OT Evaluation $OT Eval Moderate Complexity: 1 Mod OT Treatments $Self Care/Home Management : 23-37 mins  Patricia Lucas, OTR/L  05/08/24, 4:25 PM   Patricia Lucas 05/08/2024, 4:22 PM

## 2024-05-08 NOTE — Op Note (Signed)
 05/08/2024  1:07 PM  Patient:   Patricia Lucas  Pre-Op Diagnosis:   Massive irreparable rotator cuff tear with cuff arthropathy, right shoulder.  Post-Op Diagnosis:   Same with biceps tendinopathy right shoulder.  Procedure:   Reverse right total shoulder arthroplasty with biceps tenodesis.  Surgeon:   DOROTHA Reyes Maltos, MD  Assistant:   Gustavo Level, PA-C; HILLARY Hummer, PA-S  Anesthesia:   General endotracheal with an interscalene block using Exparel  placed preoperatively by the anesthesiologist.  Findings:   As above.  Complications:   None  EBL:   100 cc  Fluids:   500 cc crystalloid  UOP:   None  TT:   None  Drains:   None  Closure:   Staples  Implants:   All press-fit Integra system with a 10 mm stem, a standard metaphyseal body, a +0 mm humeral platform, a mini baseplate, and a 38 mm concentric +5 mm laterally offset glenosphere.  Brief Clinical Note:   The patient is an 83 year old female with a history of progressively worsening left right shoulder pain and weakness. Her symptoms have progressed despite medications, activity modification, etc. Her history and examination are consistent with a massive verbal rotator cuff tear with cuff arthropathy, all of which were confirmed by preoperative MRI scanning. The patient presents at this time for a reverse right total shoulder arthroplasty with biceps tenodesis.  Procedure:   The patient underwent placement of an interscalene block using Exparel  by the anesthesiologist in the preoperative holding area before being brought into the operating room and lain in the supine position. The patient then underwent general endotracheal intubation and anesthesia before the patient was repositioned in the beach chair position using the beach chair positioner. The right shoulder and upper extremity were prepped with ChloraPrep solution before being draped sterilely. Preoperative antibiotics were administered.   A timeout was performed to  verify the appropriate surgical site before a standard anterior approach to the shoulder was made through an approximately 4-5 inch incision. The incision was carried down through the subcutaneous tissues to expose the deltopectoral fascia. The interval between the deltoid and pectoralis muscles was identified and this plane developed, retracting the cephalic vein laterally with the deltoid muscle. The conjoined tendon was identified. Its lateral margin was dissected and the Kolbel self-retraining retractor inserted. The three sisters were identified and cauterized. Bursal tissues were removed to improve visualization.   The biceps tendon was identified near the inferior aspect of the bicipital groove. A soft tissue tenodesis was performed by attaching the biceps tendon to the adjacent pectoralis major tendon using two #0 Ethibond interrupted sutures. The biceps tendon was then transected just proximal to the tenodesis site. The subscapularis tendon was released from its attachment to the lesser tuberosity 1 cm proximal to its insertion and several tagging sutures placed. The inferior capsule was released with care after identifying and protecting the axillary nerve. The proximal humeral cut was made at approximately 20 of retroversion using the extra-medullary guide.   Attention was redirected to the glenoid. The labrum was debrided circumferentially before the center of the glenoid was identified. The guidewire was drilled into the glenoid neck using the appropriate guide. After verifying its position, it was overreamed with the mini-baseplate reamer to create a flat surface before the stem reamer was utilized. The superior and inferior peg sites were reamed using the appropriate guide to complete the glenoid preparation. The permanent mini-baseplate was impacted into place. It was stabilized with a 20 x 4.5  mm central screw and four peripheral screws. Locking caps were placed over the superior and inferior  screws. The permanent 38 mm concentric glenosphere with +5 mm of lateral offset was then impacted into place and its Morse taper locking mechanism verified using manual distraction.  Attention was directed to the humeral side. The humeral canal was prepared utilizing the tapered stem reamers sequentially beginning with the 7 mm stem and progressing to a 10 mm stem. This demonstrated a good tight fit. The metaphyseal region was then prepared using the appropriate planar device. The trial stem and standard metaphyseal body were put together on the back table and a trial reduction performed using the +0 mm insert. With the +0 mm insert, the arm demonstrated excellent range of motion as the hand could be brought across the chest to the opposite shoulder and brought to the top of the patient's head and to the patient's ear. The shoulder appeared stable throughout this range of motion. The joint was dislocated and the trial components removed.   The permanent 10 mm stem with the standard body was impacted into place with care taken to maintain the appropriate version. A repeat trial reduction with the +0 mm insert again demonstrated excellent stability with the findings as described above. Therefore, the shoulder was re-dislocated and, after inserting the locking screw to secure the body to the stem, the permanent +0 mm insert impacted into place. After verifying its locking mechanism, the shoulder was relocated using two finger pressure and again placed through a range of motion with the findings as described above.  The wound was copiously irrigated with sterile saline solution using the jet lavage system before 30 cc of 0.5% Sensorcaine  with epinephrine  was injected into the pericapsular and peri-incisional tissues to help with postoperative analgesia. The subscapularis tendon was reapproximated using #2 FiberWire interrupted sutures. The deltopectoral interval was closed using #0 Vicryl interrupted sutures  before the subcutaneous tissues were closed using 2-0 Vicryl interrupted sutures. The skin was closed using staples. A sterile occlusive dressing was applied to the wound before the arm was placed into a shoulder immobilizer with an abduction pillow. A Polar Care system also was applied to the shoulder. The patient was then transferred back to a hospital bed before being awakened, extubated, and returned to the recovery room in satisfactory condition after tolerating the procedure well.

## 2024-05-08 NOTE — H&P (Signed)
 History of Present Illness:  Patricia Lucas is a 83 y.o. female who has severe Right shoulder pain. Patient reports an approximate 61-month history of right shoulder discomfort for which she was previously evaluated by Dr. Edie with underlying impingement and rotator cuff tear. 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 She still notes a moderate amount of pain along the shoulder notably along the top and lateral aspects made worse with using it. States that any type of raising [place] flexion or abduction makes this way worse, and notably keeps her up at night as she has difficulty sleeping on this side. She denies having any numbness, tingling or radiation symptoms. She has failed conservative treatment including Tylenol , activity modification and application of heat. She has requested operative intervention for relief of her symptoms. She denies any previous cardiac or pulmonary history. No previous DVTs or clots. She is noted to be a diabetic, her last A1c was 6.2. She denies any previous surgeries on this right shoulder.   Current Outpatient Medications:  atorvastatin  (LIPITOR) 20 MG tablet TAKE 1 TABLET BY MOUTH EVERY DAY 90 tablet 3  biotin  10,000 mcg Cap Take by mouth.  cholecalciferol  (VITAMIN D3) 1,000 unit capsule Take 1,000 Units by mouth once daily.  CONTOUR TEST STRIPS test strip ONCE DAILY USE AS INSTRUCTED. 50 strip 5  cranberry fruit 450 mg Tab Take by mouth Take 450 mg by mouth daily.  estradioL  (ESTRACE ) 0.01 % (0.1 mg/gram) vaginal cream APPLY 1 GM BY VAGINAL ROUTE AS DIRECTED 2 TIMES PER WEEK 42.5 g 5  gabapentin  (NEURONTIN ) 100 MG capsule START 1 CAPSULE BY MOUTH NIGHTLY. CAN INCREASE BY 1 CAPSULE EVERY 5 NIGHTS IF NEEDED. 60 capsule 2  inFLIXimab (REMICADE) 100 mg injection Inject into the muscle Inject 400 mg into the muscle every 8 (eight) weeks PER PT INFUSION   lisinopriL  (ZESTRIL ) 10 MG tablet TAKE 1 TABLET BY MOUTH EVERY DAY 90 tablet 1  magnesium  oxide 400 mg Take by mouth once daily  metFORMIN (GLUCOPHAGE) 500 MG tablet TAKE 1 TABLET BY MOUTH TWICE A DAY 180 tablet 3  metoprolol  SUCCinate (TOPROL -XL) 50 MG XL tablet TAKE 1 TABLET BY MOUTH TWICE A DAY 180 tablet 0  nitrofurantoin (MACRODANTIN) 100 MG capsule Take 1 capsule (100 mg total) by mouth once daily for 180 days 90 capsule 1  OMEGA-3 FATTY ACIDS/FISH OIL (OMEGA 3 FISH OIL ORAL) Take by mouth. Reported on 10/18/2015 tiZANidine  (ZANAFLEX ) 4 MG tablet TAKE 1/2 TABLET BY MOUTH EVERY 8 HOURS AS NEEDED 60 tablet 2  traMADoL  (ULTRAM ) 50 mg tablet TAKE 1 TABLET BY MOUTH 2 TIMES DAILY AS NEEDED FOR PAIN. 14 tablet 0  traZODone  (DESYREL ) 50 MG tablet TAKE 1 TABLET BY MOUTH AT BEDTIME FOR 30 DAYS. 30 tablet 1   Allergies:  NSAIDs (NOT TO TAKE DUE TO RENAL DISEASE)   Past Medical History:  Chronic cystitis  Diabetes mellitus type 2, uncomplicated (CMS/HHS-HCC)  A1C 5.8, 08/11/13, MA 169 09/07/11. Eye check 09/16/13, no retinopathy.  Dyspepsia  EGD 06/03/08, mild gastritis.  Ehrlichiosis August 2016  IGM +  H/O adenomatous polyp of colon 05/07/2014  H/O mammogram 05/27/2012  History of bone density study 10/01/2013  History of pleurisy  Osteoarthritis  Osteoporosis, post-menopausal  Psoriasis  Psoriatic arthritis (CMS/HHS-HCC)   Past Surgical History:  HYSTERECTOMY 1970 (secondary to bleeding and partial left oophorectomy)  Left salpingo-oophorectomy and pelvic support 06/2004  COLONOSCOPY 06/03/2008 (  Adenomatous Polyp)  EGD 06/03/2008 (No repeat per RTE)  Bilateral cataracts 08/2009  COLONOSCOPY 06/11/2014 (Adenomatous Polyps: CBF 06/2019)  Limited arthroscopic debridemetn, arthroscopic subacromial decompression and mini-open rotator cuff repair using smith and newphew regeneten patch, left shoulder Left 08/15/2018 (Dr. Edie)  Colon @ [facility] 07/14/2022 (Path reviewed. Unremarkable but  clinically consistent with ischemic colitis/Repeat 12/29/2022/CTL)  Colon @ [facility] 12/29/2022 (Tubular adenoma/PHx CP/No repeat due to age/CTL)  CLOSED REDUCTION PROXIMAL FIBULAR FRACTURE  OOPHORECTOMY   Family History:  Breast cancer Mother  Coronary Artery Disease Father   Social History:   Socioeconomic History:  Marital status: Widowed  Tobacco Use  Smoking status: Former  Current packs/day: 0.00  Average packs/day: 0.5 packs/day for 35.0 years (17.5 ttl pk-yrs)  Types: Cigarettes  Start date: 01/18/1963  Quit date: 01/17/1998  Years since quitting: 26.0  Smokeless tobacco: Never  Vaping Use  Vaping status: Never Used  Substance and Sexual Activity  Alcohol use: No  Alcohol/week: 0.0 standard drinks of alcohol  Drug use: No  Sexual activity: Never  Social History Narrative  Exercise: 6-7 x a week for 60 minutes each time. Does weights and a treadmill.  Diet: Red meat 1-2 x a week. Fast foods rarely. Fried foods rarely.  Pelvic:2017   Social Drivers of Health:   Physicist, Medical Strain: Low Risk (12/24/2023)  Overall Financial Resource Strain (CARDIA)  Difficulty of Paying Living Expenses: Not hard at all  Food Insecurity: No Food Insecurity (12/24/2023)  Hunger Vital Sign  Worried About Running Out of Food in the Last Year: Never true  Ran Out of Food in the Last Year: Never true  Transportation Needs: No Transportation Needs (12/24/2023)  PRAPARE - Risk Analyst (Medical): No  Lack of Transportation (Non-Medical): No   Review of Systems:  A comprehensive 14 point ROS was performed, reviewed, and the pertinent orthopaedic findings are documented in the HPI.  Physical Exam: Vitals:  02/04/24 1020  BP: (!) 140/90  Weight: 64.9 kg (143 lb)  Height: 157.5 cm (5' 2)  PainSc: 5  PainLoc: Shoulder   General/Constitutional: The patient appears to be well-nourished, well-developed, and in no acute distress. Neuro/Psych: Normal mood and  affect, oriented to person, place and time. Eyes: Non-icteric. Pupils are equal, round, and reactive to light, and exhibit synchronous movement. ENT: Unremarkable. Lymphatic: No palpable adenopathy. Respiratory: Lungs clear to auscultation, Normal chest excursion, No wheezes, and Non-labored breathing Cardiovascular: Regular rate and rhythm. No murmurs. and No edema, swelling or tenderness, except as noted in detailed exam. Integumentary: No impressive skin lesions present, except as noted in detailed exam. Musculoskeletal: Unremarkable, except as noted in detailed exam.  Right shoulder exam: SKIN: Normal SWELLING: None WARMTH: None LYMPH NODES: No adenopathy palpable CREPITUS: None TENDERNESS: Mild tenderness over anterolateral shoulder ROM (active):  Forward flexion: 140 degrees Abduction: 130 degrees Internal rotation: L3 ROM (passive):  Forward flexion: 150 degrees Abduction: 140 degrees ER/IR at 90 abd: 85 degrees/35 degrees  She notes mild-moderate pain with active forward flexion, abduction, and internal rotation, as well as with passive internal rotation at 90 degrees.  STRENGTH: Forward flexion: 3+-4/5 Abduction: 3+-4/5 External rotation: 4/5 Internal rotation: 4/5 Pain with RC testing: Mild-moderate pain with resisted strength testing in all planes  STABILITY: Normal  SPECIAL TESTS: Vonzell' test: Moderately positive Speed's test: Not evaluated Capsulitis - pain w/ passive ER: No Crossed arm test: Mild-moderately positive Crank: Not evaluated Anterior apprehension: Negative Posterior apprehension: Not evaluated  She remains neurovascularly intact to the  right upper extremity.  Imaging:  A recent MRI scan of the right shoulder is available for review and has been reviewed by myself. By report, the scan demonstrates evidence of a large chronically retracted rotator cuff tear involving the entire supraspinatus as well as much of the infraspinatus tendons,  permitting the humeral head to rise superiorly and abut the underside of the acromion. Mild to moderate degenerative changes of the glenohumeral joint also are noted. Both the films and report were reviewed by myself and discussed with the patient and her daughter.  Assessment: Encounter Diagnoses  Name Primary?  Rotator cuff tendinitis, right Yes  Nontraumatic complete tear of right rotator cuff   Plan: The treatment options were discussed with the patient and her daughter. In addition, patient educational materials were provided regarding the diagnosis and treatment options. The patient is quite frustrated by her symptoms and functional limitations, and is now ready to consider more aggressive treatment options. Therefore, I have recommended a surgical procedure, specifically a reverse right total shoulder arthroplasty. The procedure was discussed with the patient, as were the potential risks (including bleeding, infection, nerve and/or blood vessel injury, persistent or recurrent pain, loosening and/or failure of the components, dislocation, need for further surgery, blood clots, strokes, heart attacks and/or arhythmias, pneumonia, etc.) and benefits. The patient states his/her understanding and wishes to proceed. All of the patient's questions and concerns were answered. She can call any time with further concerns. She will follow up post-surgery, routine.    H&P reviewed and patient re-examined. No changes.

## 2024-05-09 ENCOUNTER — Encounter: Payer: Self-pay | Admitting: Surgery

## 2024-06-04 ENCOUNTER — Encounter: Payer: Self-pay | Admitting: Surgery

## 2024-07-29 NOTE — Progress Notes (Unsigned)
 "    07/30/2024 2:00 PM   [facility] Patricia Lucas [birth year] [id number]  Referring provider: Glover Lenis, MD [phone] Patricia Lucas [place],  [place] [id number]  Urological history: 1. rUTI's -contributing factors of age, vaginal atrophy, bladder suspension (2010) -RUS (04/2024) no nidus for infection  -cysto (2023) NED  -contrast CT (07/2022) - no nidus for infections -documented urine cultures over the last year  - March 28, 2024-no growth - February 22, 2024 - E.coli - January 18, 2024 - E.coli - December 25, 2023 - E.coli - Nov 11, 2023 - E.coli - September 18, 2023 - E.coli - vaginal estrogen cream and cranberry tablets twice daily and macrobid 100 mg daily   2. Nocturia -contributing factors of age, hypertension, diabetes and arthritis  Chief Complaint  Patient presents with   Recurrent UTI   HPI: Patricia Lucas is a 84 y.o.  woman presents today for rUTI's follow up.   Previous records reviewed.   She has been doing well since she started applying the vaginal estrogen cream 3 nights weekly.  She has had no urinary tract infections since September.  She has had 1-7 daytime voids, 1-2 episodes of nocturia with a mild urge to urinate.  She has no incontinence.  She wears 1 panty liner daily more for confidence in any leakage.  She does not limit fluid intake and she does not engage in toilet mapping.  Patient denies any modifying or aggravating factors.  Patient denies any recent UTI's, gross hematuria, dysuria or suprapubic/flank pain.  Patient denies any fevers, chills, nausea or vomiting.    UA yellow clear, specific gravity 1.010, pH 6.5, 1+ protein, 0-5 WBC's, 0-2 RBC's, > 10 epithelial cells, and moderate bacteria.    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   Colon polyp    Complication of  anesthesia 2017   vomitting with waking up after colonoscopy   Diabetes mellitus without complication (HCC)    Generalized OA    GERD (gastroesophageal reflux disease)    Hypertension    Nontraumatic complete tear of right rotator cuff    Osteoarthritis of right glenohumeral joint    Osteoporosis    Pneumonia    PONV (postoperative nausea and vomiting)    Psoriasis    Psoriatic arthritis (HCC)    UTI (lower urinary tract infection)     Surgical History: Past Surgical History:  Procedure Laterality Date   ABDOMINAL HYSTERECTOMY  1969   APPENDECTOMY     BLADDER SUSPENSION  2010   CATARACT EXTRACTION, BILATERAL     COLONOSCOPY WITH PROPOFOL  N/A 12/29/2022   Procedure: COLONOSCOPY WITH PROPOFOL ;  Surgeon: Maryruth Ole DASEN, MD;  Location: [facility];  Service: Endoscopy;  Laterality: N/A;   fibula fract     FLEXIBLE SIGMOIDOSCOPY N/A 07/14/2022   Procedure: FLEXIBLE SIGMOIDOSCOPY;  Surgeon: Maryruth Ole DASEN, MD;  Location: [facility];  Service: Endoscopy;  Laterality: N/A;   OVARIAN CYST REMOVAL  2015   POLYPECTOMY  12/29/2022   Procedure: POLYPECTOMY;  Surgeon: Maryruth Ole DASEN, MD;  Location: [facility];  Service: Endoscopy;;   REVERSE SHOULDER ARTHROPLASTY Right 05/08/2024   Procedure: ARTHROPLASTY, SHOULDER, TOTAL, REVERSE;  Surgeon: Edie Norleen PARAS, MD;  Location: [facility];  Service: Orthopedics;  Laterality: Right;  SHOULDER ARTHROSCOPY WITH SUBACROMIAL DECOMPRESSION AND BICEP TENDON REPAIR Left 08/15/2018   Procedure: SHOULDER ARTHROSCOPY WITH DEBRIDEMENT, DECOMPRESSION, ROTATOR CUFF REPAIR AND BICEP TENODESIS-LEFT;  Surgeon: Edie Norleen PARAS, MD;  Location: [facility];  Service: Orthopedics;  Laterality: Left;    Home Medications:  Allergies as of 07/30/2024       Reactions   Nsaids    NOT TO TAKE DUE TO RENAL DISEASE   Other Nausea And Vomiting   Narcotic pain meds make her very sick , she reports she can take with phenergan .        Medication List         Accurate as of July 30, 2024  2:00 PM. If you have any questions, ask your nurse or doctor.          atorvastatin  20 MG tablet Commonly known as: LIPITOR Take 20 mg by mouth daily.   Cranberry 450 MG Tabs Take 450 mg by mouth 2 (two) times daily.   estradiol  0.1 MG/GM vaginal cream Commonly known as: ESTRACE  VAGINAL Apply 0.5mg  (pea-sized amount)  just inside the vaginal introitus with a finger-tip on Monday, Wednesday and Friday nights. What changed: Another medication with the same name was added. Make sure you understand how and when to take each. Changed by: CLOTILDA CORNWALL   estradiol  0.01 % Crea vaginal cream Commonly known as: ESTRACE  Apply one pea-sized amount around the opening of the urethra daily for 2 weeks, then 3 times weekly moving forward. What changed: You were already taking a medication with the same name, and this prescription was added. Make sure you understand how and when to take each. Changed by: CLOTILDA CORNWALL   gabapentin  100 MG capsule Commonly known as: NEURONTIN  Take 200 mg by mouth at bedtime.   HAIR/SKIN/NAILS PO Take 1 tablet by mouth daily.   inFLIXimab 100 MG injection Commonly known as: REMICADE Inject 300 mg into the muscle every 8 (eight) weeks.   lisinopril  10 MG tablet Commonly known as: ZESTRIL  Take 10 mg by mouth daily.   MAGNESIUM  OXIDE PO Take 500 mg by mouth daily.   metFORMIN 500 MG tablet Commonly known as: GLUCOPHAGE Take 500 mg by mouth 2 (two) times daily.   metoprolol  succinate 50 MG 24 hr tablet Commonly known as: TOPROL -XL Take 50 mg by mouth 2 (two) times daily.   nitrofurantoin 100 MG capsule Commonly known as: MACRODANTIN Take 100 mg by mouth daily.   OMEGA 3 PO Take 1 capsule by mouth 2 (two) times daily.   oxyCODONE  5 MG immediate release tablet Commonly known as: Roxicodone  Take 1-2 tablets (5-10 mg total) by mouth every 4 (four) hours as needed for moderate pain (pain score 4-6) or  severe pain (pain score 7-10).   promethazine  25 MG tablet Commonly known as: PHENERGAN  Take 1 tablet (25 mg total) by mouth every 6 (six) hours as needed for nausea or vomiting.   tiZANidine  4 MG tablet Commonly known as: ZANAFLEX  Take 4 mg by mouth at bedtime.   traZODone  50 MG tablet Commonly known as: DESYREL  Take 50 mg by mouth at bedtime as needed for sleep.   Vitamin D3 25 MCG (1000 UT) Caps Take 1,000 Units by mouth at bedtime.        Allergies:  Allergies  Allergen Reactions   Nsaids     NOT TO TAKE DUE TO RENAL DISEASE   Other Nausea And Vomiting    Narcotic pain meds make her very sick , she reports she can  take with phenergan .    Family History: Family History  Problem Relation Age of Onset   Breast cancer Mother 17   Breast cancer Sister    Breast cancer Maternal Aunt 9   Bladder Cancer Neg Hx    Kidney cancer Neg Hx    Prostate cancer Neg Hx     Social History:  reports that she quit smoking about 27 years ago. Her smoking use included cigarettes. She smoked an average of 1 pack per day. She has never used smokeless tobacco. She reports that she does not drink alcohol and does not use drugs.  ROS: Pertinent ROS in HPI  Physical Exam: BP (!) 168/81   Pulse 84   Wt 145 lb (65.8 kg)   SpO2 98%   BMI 27.40 kg/m   Constitutional:  Well nourished. Alert and oriented, No acute distress. HEENT: [place] AT, moist mucus membranes.  Trachea midline Cardiovascular: No clubbing, cyanosis, or edema. Respiratory: Normal respiratory effort, no increased work of breathing. Neurologic: Grossly intact, no focal deficits, moving all 4 extremities. Psychiatric: Normal mood and affect.   Laboratory Data: See EPIC and HPI  I have reviewed the labs.   Pertinent Imaging:  07/30/24 13:39  Scan Result 4mL    Assessment & Plan:    1. rUTI's - criteria for recurrent UTI has been met with 2 or more infections in 6 months or 3 or greater infections in one year   - continue vaginal estrogen cream as it is first-line therapy for postmenopausal women  - patient is instructed to increase their water intake until the urine is pale yellow or clear (10 to 12 cups daily)  - nitrofurantoin prophylaxis prescribed by PCP - Explained that the bacteria seen on today's urine specimen was likely the result from squamous cells and normal GI genital flora as she did not have any evidence of pyuria                                             2. GSM - Having great results with applying vaginal estrogen cream 3 nights weekly - She will continue applying the vaginal estrogen cream nights weekly, I sent a refill in        Return in about 1 year (around 07/30/2025) for OAB questionnaire and UA .  These notes generated with voice recognition software. I apologize for typographical errors.  CLOTILDA HELON RIGGERS  [facility] [address]  [address] [place], [place] [address] [phone]  "

## 2024-07-30 ENCOUNTER — Ambulatory Visit: Payer: Self-pay | Admitting: Urology

## 2024-07-30 ENCOUNTER — Encounter: Payer: Self-pay | Admitting: Urology

## 2024-07-30 VITALS — BP 168/81 | HR 84 | Wt 145.0 lb

## 2024-07-30 DIAGNOSIS — N390 Urinary tract infection, site not specified: Secondary | ICD-10-CM | POA: Diagnosis not present

## 2024-07-30 LAB — URINALYSIS, COMPLETE
Bilirubin, UA: NEGATIVE
Glucose, UA: NEGATIVE
Ketones, UA: NEGATIVE
Leukocytes,UA: NEGATIVE
Nitrite, UA: NEGATIVE
RBC, UA: NEGATIVE
Specific Gravity, UA: 1.01 (ref 1.005–1.030)
Urobilinogen, Ur: 0.2 mg/dL (ref 0.2–1.0)
pH, UA: 6.5 (ref 5.0–7.5)

## 2024-07-30 LAB — BLADDER SCAN AMB NON-IMAGING

## 2024-07-30 LAB — MICROSCOPIC EXAMINATION: Epithelial Cells (non renal): >10 /HPF — AB (ref 0–10)

## 2024-07-30 MED ORDER — ESTRADIOL 0.01 % VA CREA: TOPICAL_CREAM | VAGINAL | 12 refills | Status: AC

## 2025-07-30 ENCOUNTER — Ambulatory Visit: Admitting: Urology
